# Patient Record
Sex: Female | Born: 1970 | Race: Black or African American | Hispanic: No | Marital: Single | Smoking: Former smoker
Health system: Southern US, Community
[De-identification: ages and names within clinical notes are randomized; demographics above are authoritative.]

## PROBLEM LIST (undated history)

## (undated) DIAGNOSIS — I10 Essential (primary) hypertension: Secondary | ICD-10-CM

## (undated) DIAGNOSIS — I82409 Acute embolism and thrombosis of unspecified deep veins of unspecified lower extremity: Secondary | ICD-10-CM

## (undated) DIAGNOSIS — J45909 Unspecified asthma, uncomplicated: Secondary | ICD-10-CM

## (undated) DIAGNOSIS — D849 Immunodeficiency, unspecified: Secondary | ICD-10-CM

## (undated) HISTORY — PX: ORIF FINGER FRACTURE: SHX2122

## (undated) HISTORY — PX: KNEE SURGERY: SHX244

## (undated) HISTORY — PX: ABDOMINAL HYSTERECTOMY: SHX81

## (undated) HISTORY — PX: CHOLECYSTECTOMY: SHX55

## (undated) HISTORY — PX: TUBAL LIGATION: SHX77

## (undated) HISTORY — PX: HAND SURGERY: SHX662

---

## 2000-09-30 DIAGNOSIS — B20 Human immunodeficiency virus [HIV] disease: Secondary | ICD-10-CM | POA: Insufficient documentation

## 2004-03-04 ENCOUNTER — Other Ambulatory Visit: Payer: Self-pay

## 2004-06-28 ENCOUNTER — Emergency Department: Payer: Self-pay | Admitting: Emergency Medicine

## 2004-09-30 ENCOUNTER — Emergency Department: Payer: Self-pay | Admitting: Emergency Medicine

## 2004-10-18 ENCOUNTER — Emergency Department: Payer: Self-pay | Admitting: Emergency Medicine

## 2004-11-14 ENCOUNTER — Ambulatory Visit: Payer: Self-pay

## 2005-02-09 ENCOUNTER — Emergency Department: Payer: Self-pay | Admitting: Emergency Medicine

## 2005-03-19 ENCOUNTER — Emergency Department: Payer: Self-pay | Admitting: Emergency Medicine

## 2005-05-03 ENCOUNTER — Emergency Department: Payer: Self-pay | Admitting: Emergency Medicine

## 2005-06-13 ENCOUNTER — Inpatient Hospital Stay: Payer: Self-pay | Admitting: Surgery

## 2005-07-03 ENCOUNTER — Emergency Department: Payer: Self-pay | Admitting: Emergency Medicine

## 2005-10-23 ENCOUNTER — Emergency Department: Payer: Self-pay | Admitting: Emergency Medicine

## 2005-11-05 ENCOUNTER — Emergency Department: Payer: Self-pay | Admitting: Emergency Medicine

## 2006-03-25 ENCOUNTER — Emergency Department: Payer: Self-pay | Admitting: Emergency Medicine

## 2006-04-06 ENCOUNTER — Emergency Department: Payer: Self-pay | Admitting: Emergency Medicine

## 2006-07-22 ENCOUNTER — Emergency Department: Payer: Self-pay | Admitting: Emergency Medicine

## 2006-07-23 ENCOUNTER — Ambulatory Visit: Payer: Self-pay | Admitting: Unknown Physician Specialty

## 2006-09-29 ENCOUNTER — Emergency Department: Payer: Self-pay

## 2006-12-31 ENCOUNTER — Emergency Department: Payer: Self-pay | Admitting: Emergency Medicine

## 2007-02-22 ENCOUNTER — Emergency Department: Payer: Self-pay | Admitting: Emergency Medicine

## 2007-08-31 ENCOUNTER — Emergency Department: Payer: Self-pay | Admitting: Internal Medicine

## 2007-10-09 ENCOUNTER — Emergency Department: Payer: Self-pay | Admitting: Emergency Medicine

## 2007-10-22 ENCOUNTER — Emergency Department: Payer: Self-pay | Admitting: Emergency Medicine

## 2007-11-21 ENCOUNTER — Emergency Department: Payer: Self-pay | Admitting: Emergency Medicine

## 2008-01-09 ENCOUNTER — Emergency Department: Payer: Self-pay | Admitting: Emergency Medicine

## 2008-01-12 ENCOUNTER — Emergency Department: Payer: Self-pay | Admitting: Emergency Medicine

## 2008-01-31 ENCOUNTER — Emergency Department: Payer: Self-pay | Admitting: Emergency Medicine

## 2008-02-23 ENCOUNTER — Emergency Department: Payer: Self-pay | Admitting: Internal Medicine

## 2008-03-02 ENCOUNTER — Emergency Department: Payer: Self-pay | Admitting: Emergency Medicine

## 2008-03-09 ENCOUNTER — Emergency Department (HOSPITAL_COMMUNITY): Admission: EM | Admit: 2008-03-09 | Discharge: 2008-03-09 | Payer: Self-pay | Admitting: Emergency Medicine

## 2008-09-27 ENCOUNTER — Emergency Department: Payer: Self-pay | Admitting: Emergency Medicine

## 2009-01-30 ENCOUNTER — Emergency Department: Payer: Self-pay | Admitting: Emergency Medicine

## 2009-04-14 ENCOUNTER — Emergency Department: Payer: Self-pay | Admitting: Unknown Physician Specialty

## 2009-11-30 DIAGNOSIS — F331 Major depressive disorder, recurrent, moderate: Secondary | ICD-10-CM | POA: Insufficient documentation

## 2010-01-30 ENCOUNTER — Emergency Department: Payer: Self-pay | Admitting: Emergency Medicine

## 2010-04-21 ENCOUNTER — Emergency Department: Payer: Self-pay | Admitting: Emergency Medicine

## 2010-04-23 ENCOUNTER — Emergency Department: Payer: Self-pay | Admitting: Unknown Physician Specialty

## 2010-04-24 DIAGNOSIS — D509 Iron deficiency anemia, unspecified: Secondary | ICD-10-CM | POA: Insufficient documentation

## 2010-04-24 DIAGNOSIS — J45909 Unspecified asthma, uncomplicated: Secondary | ICD-10-CM | POA: Insufficient documentation

## 2010-04-24 DIAGNOSIS — Z86718 Personal history of other venous thrombosis and embolism: Secondary | ICD-10-CM | POA: Insufficient documentation

## 2010-04-28 ENCOUNTER — Emergency Department: Payer: Self-pay | Admitting: Emergency Medicine

## 2010-05-17 ENCOUNTER — Emergency Department: Payer: Self-pay | Admitting: Emergency Medicine

## 2010-05-26 ENCOUNTER — Emergency Department: Payer: Self-pay | Admitting: Emergency Medicine

## 2010-07-27 ENCOUNTER — Emergency Department: Payer: Self-pay | Admitting: Emergency Medicine

## 2010-10-05 ENCOUNTER — Ambulatory Visit: Payer: Self-pay | Admitting: Emergency Medicine

## 2010-10-09 ENCOUNTER — Emergency Department: Payer: Self-pay | Admitting: Emergency Medicine

## 2010-10-09 LAB — PATHOLOGY REPORT

## 2010-11-28 ENCOUNTER — Emergency Department: Payer: Self-pay | Admitting: Emergency Medicine

## 2011-01-13 ENCOUNTER — Emergency Department: Payer: Self-pay | Admitting: Unknown Physician Specialty

## 2011-01-15 ENCOUNTER — Emergency Department: Payer: Self-pay | Admitting: *Deleted

## 2011-04-29 ENCOUNTER — Emergency Department: Payer: Self-pay | Admitting: Internal Medicine

## 2011-08-29 ENCOUNTER — Emergency Department: Payer: Self-pay

## 2011-08-29 LAB — URINALYSIS, COMPLETE
Bilirubin,UR: NEGATIVE
Glucose,UR: NEGATIVE mg/dL (ref 0–75)
Leukocyte Esterase: NEGATIVE
Specific Gravity: 1.021 (ref 1.003–1.030)
Squamous Epithelial: 26

## 2011-12-02 ENCOUNTER — Emergency Department: Payer: Self-pay | Admitting: Emergency Medicine

## 2012-01-12 ENCOUNTER — Inpatient Hospital Stay: Payer: Self-pay | Admitting: Surgery

## 2012-01-12 LAB — URINALYSIS, COMPLETE
Bilirubin,UR: NEGATIVE
Blood: NEGATIVE
Glucose,UR: NEGATIVE mg/dL (ref 0–75)
Ketone: NEGATIVE
Leukocyte Esterase: NEGATIVE
Nitrite: NEGATIVE
Ph: 5 (ref 4.5–8.0)
Protein: 30
RBC,UR: 2 /HPF (ref 0–5)
Squamous Epithelial: 8
WBC UR: 2 /HPF (ref 0–5)

## 2012-01-12 LAB — COMPREHENSIVE METABOLIC PANEL
BUN: 12 mg/dL (ref 7–18)
Bilirubin,Total: 2.7 mg/dL — ABNORMAL HIGH (ref 0.2–1.0)
Calcium, Total: 9 mg/dL (ref 8.5–10.1)
Chloride: 106 mmol/L (ref 98–107)
Co2: 20 mmol/L — ABNORMAL LOW (ref 21–32)
Creatinine: 0.78 mg/dL (ref 0.60–1.30)
EGFR (African American): >60
EGFR (Non-African Amer.): >60
Potassium: 3.4 mmol/L — ABNORMAL LOW (ref 3.5–5.1)
SGPT (ALT): 37 U/L
Total Protein: 8.3 g/dL — ABNORMAL HIGH (ref 6.4–8.2)

## 2012-01-12 LAB — CBC
HCT: 46.2 % (ref 35.0–47.0)
HGB: 15.7 g/dL (ref 12.0–16.0)
MCHC: 33.9 g/dL (ref 32.0–36.0)
Platelet: 246 10*3/uL (ref 150–440)
WBC: 7.2 10*3/uL (ref 3.6–11.0)

## 2012-03-16 ENCOUNTER — Emergency Department: Payer: Self-pay | Admitting: Emergency Medicine

## 2012-03-16 LAB — CBC WITH DIFFERENTIAL/PLATELET
Basophil #: 0 10*3/uL (ref 0.0–0.1)
Eosinophil #: 0.2 10*3/uL (ref 0.0–0.7)
HGB: 14.9 g/dL (ref 12.0–16.0)
Lymphocyte #: 1.5 10*3/uL (ref 1.0–3.6)
Lymphocyte %: 38.1 %
MCV: 96 fL (ref 80–100)
Monocyte #: 0.5 x10 3/mm (ref 0.2–0.9)
Monocyte %: 13.3 %
Platelet: 211 10*3/uL (ref 150–440)
RBC: 4.65 10*6/uL (ref 3.80–5.20)
RDW: 13.3 % (ref 11.5–14.5)
WBC: 4 10*3/uL (ref 3.6–11.0)

## 2012-03-16 LAB — BASIC METABOLIC PANEL
Anion Gap: 9 (ref 7–16)
BUN: 13 mg/dL (ref 7–18)
Calcium, Total: 9 mg/dL (ref 8.5–10.1)
Co2: 26 mmol/L (ref 21–32)
EGFR (African American): >60
EGFR (Non-African Amer.): >60
Glucose: 99 mg/dL (ref 65–99)
Osmolality: 281 (ref 275–301)

## 2012-04-05 ENCOUNTER — Emergency Department: Payer: Self-pay | Admitting: Emergency Medicine

## 2012-06-03 ENCOUNTER — Emergency Department: Payer: Self-pay | Admitting: Emergency Medicine

## 2012-06-18 HISTORY — PX: HERNIA REPAIR: SHX51

## 2012-06-26 ENCOUNTER — Inpatient Hospital Stay: Payer: Self-pay | Admitting: Psychiatry

## 2012-06-26 LAB — COMPREHENSIVE METABOLIC PANEL
Albumin: 4.1 g/dL (ref 3.4–5.0)
Alkaline Phosphatase: 113 U/L (ref 50–136)
BUN: 10 mg/dL (ref 7–18)
Bilirubin,Total: 2.1 mg/dL — ABNORMAL HIGH (ref 0.2–1.0)
Calcium, Total: 8.4 mg/dL — ABNORMAL LOW (ref 8.5–10.1)
Creatinine: 0.88 mg/dL (ref 0.60–1.30)
EGFR (African American): >60
EGFR (Non-African Amer.): >60
Osmolality: 282 (ref 275–301)
SGPT (ALT): 243 U/L — ABNORMAL HIGH (ref 12–78)
Total Protein: 7.9 g/dL (ref 6.4–8.2)

## 2012-06-26 LAB — URINALYSIS, COMPLETE
Bilirubin,UR: NEGATIVE
Glucose,UR: NEGATIVE mg/dL (ref 0–75)
Ketone: NEGATIVE
Leukocyte Esterase: NEGATIVE
Nitrite: NEGATIVE
Protein: 100
Squamous Epithelial: 4
WBC UR: <1 /HPF (ref 0–5)

## 2012-06-26 LAB — DRUG SCREEN, URINE
Barbiturates, Ur Screen: NEGATIVE (ref ?–200)
Benzodiazepine, Ur Scrn: NEGATIVE (ref ?–200)
Cannabinoid 50 Ng, Ur ~~LOC~~: POSITIVE (ref ?–50)
Cocaine Metabolite,Ur ~~LOC~~: POSITIVE (ref ?–300)
Methadone, Ur Screen: NEGATIVE (ref ?–300)
Opiate, Ur Screen: NEGATIVE (ref ?–300)
Phencyclidine (PCP) Ur S: NEGATIVE (ref ?–25)
Tricyclic, Ur Screen: NEGATIVE (ref ?–1000)

## 2012-06-26 LAB — CBC
HCT: 46.8 % (ref 35.0–47.0)
MCH: 32.2 pg (ref 26.0–34.0)
MCHC: 33.6 g/dL (ref 32.0–36.0)
MCV: 96 fL (ref 80–100)
Platelet: 254 10*3/uL (ref 150–440)
RBC: 4.9 10*6/uL (ref 3.80–5.20)
WBC: 5.8 10*3/uL (ref 3.6–11.0)

## 2012-06-27 LAB — BEHAVIORAL MEDICINE 1 PANEL
Albumin: 3.3 g/dL — ABNORMAL LOW (ref 3.4–5.0)
Alkaline Phosphatase: 106 U/L (ref 50–136)
Anion Gap: 7 (ref 7–16)
BUN: 15 mg/dL (ref 7–18)
Basophil #: 0 10*3/uL (ref 0.0–0.1)
Basophil %: 0.8 %
Bilirubin,Total: 1.3 mg/dL — ABNORMAL HIGH (ref 0.2–1.0)
Calcium, Total: 8.6 mg/dL (ref 8.5–10.1)
Chloride: 110 mmol/L — ABNORMAL HIGH (ref 98–107)
Co2: 26 mmol/L (ref 21–32)
Creatinine: 0.94 mg/dL (ref 0.60–1.30)
EGFR (African American): >60
EGFR (Non-African Amer.): >60
Eosinophil #: 0.1 10*3/uL (ref 0.0–0.7)
Eosinophil %: 3.2 %
Glucose: 88 mg/dL (ref 65–99)
HCT: 45.9 % (ref 35.0–47.0)
HGB: 15.2 g/dL (ref 12.0–16.0)
Lymphocyte #: 1.9 10*3/uL (ref 1.0–3.6)
Lymphocyte %: 46.1 %
MCH: 31.7 pg (ref 26.0–34.0)
MCHC: 33.1 g/dL (ref 32.0–36.0)
MCV: 96 fL (ref 80–100)
Monocyte #: 0.4 x10 3/mm (ref 0.2–0.9)
Monocyte %: 10.9 %
Neutrophil #: 1.6 10*3/uL (ref 1.4–6.5)
Neutrophil %: 39 %
Osmolality: 285 (ref 275–301)
Platelet: 203 10*3/uL (ref 150–440)
Potassium: 3.9 mmol/L (ref 3.5–5.1)
RBC: 4.8 10*6/uL (ref 3.80–5.20)
RDW: 14.7 % — ABNORMAL HIGH (ref 11.5–14.5)
SGOT(AST): 107 U/L — ABNORMAL HIGH (ref 15–37)
SGPT (ALT): 172 U/L — ABNORMAL HIGH (ref 12–78)
Sodium: 143 mmol/L (ref 136–145)
Thyroid Stimulating Horm: 0.913 u[IU]/mL
Total Protein: 6.6 g/dL (ref 6.4–8.2)
WBC: 4 10*3/uL (ref 3.6–11.0)

## 2012-08-08 ENCOUNTER — Emergency Department: Payer: Self-pay | Admitting: Emergency Medicine

## 2014-01-11 ENCOUNTER — Emergency Department: Payer: Self-pay | Admitting: Emergency Medicine

## 2014-01-11 LAB — URINALYSIS, COMPLETE
Bilirubin,UR: NEGATIVE
Glucose,UR: NEGATIVE mg/dL (ref 0–75)
Leukocyte Esterase: NEGATIVE
Nitrite: NEGATIVE
Ph: 5 (ref 4.5–8.0)
Protein: >=500
RBC,UR: 4 /HPF (ref 0–5)
Specific Gravity: 1.022 (ref 1.003–1.030)

## 2014-01-11 LAB — COMPREHENSIVE METABOLIC PANEL
ALT: 94 U/L — AB
Albumin: 4 g/dL (ref 3.4–5.0)
Alkaline Phosphatase: 106 U/L
Anion Gap: 11 (ref 7–16)
BILIRUBIN TOTAL: 0.6 mg/dL (ref 0.2–1.0)
BUN: 10 mg/dL (ref 7–18)
CREATININE: 0.78 mg/dL (ref 0.60–1.30)
Calcium, Total: 8.7 mg/dL (ref 8.5–10.1)
Chloride: 108 mmol/L — ABNORMAL HIGH (ref 98–107)
Co2: 18 mmol/L — ABNORMAL LOW (ref 21–32)
EGFR (Non-African Amer.): >60
Glucose: 81 mg/dL (ref 65–99)
Osmolality: 272 (ref 275–301)
Potassium: 3.7 mmol/L (ref 3.5–5.1)
SGOT(AST): 78 U/L — ABNORMAL HIGH (ref 15–37)
Sodium: 137 mmol/L (ref 136–145)
Total Protein: 8.4 g/dL — ABNORMAL HIGH (ref 6.4–8.2)

## 2014-01-11 LAB — DRUG SCREEN, URINE
AMPHETAMINES, UR SCREEN: NEGATIVE (ref ?–1000)
Barbiturates, Ur Screen: NEGATIVE (ref ?–200)
Benzodiazepine, Ur Scrn: NEGATIVE (ref ?–200)
CANNABINOID 50 NG, UR ~~LOC~~: POSITIVE (ref ?–50)
COCAINE METABOLITE, UR ~~LOC~~: POSITIVE (ref ?–300)
MDMA (ECSTASY) UR SCREEN: NEGATIVE (ref ?–500)
METHADONE, UR SCREEN: NEGATIVE (ref ?–300)
Opiate, Ur Screen: NEGATIVE (ref ?–300)
PHENCYCLIDINE (PCP) UR S: NEGATIVE (ref ?–25)
Tricyclic, Ur Screen: NEGATIVE (ref ?–1000)

## 2014-01-11 LAB — CBC
HCT: 46.9 % (ref 35.0–47.0)
HGB: 15.1 g/dL (ref 12.0–16.0)
MCH: 31.9 pg (ref 26.0–34.0)
MCHC: 32.2 g/dL (ref 32.0–36.0)
MCV: 99 fL (ref 80–100)
PLATELETS: 233 10*3/uL (ref 150–440)
RBC: 4.74 10*6/uL (ref 3.80–5.20)
RDW: 13.5 % (ref 11.5–14.5)
WBC: 8.3 10*3/uL (ref 3.6–11.0)

## 2014-01-11 LAB — ACETAMINOPHEN LEVEL

## 2014-01-11 LAB — ETHANOL
Ethanol %: 0.184 % — ABNORMAL HIGH (ref 0.000–0.080)
Ethanol: 184 mg/dL

## 2014-01-11 LAB — SALICYLATE LEVEL: Salicylates, Serum: 6.2 mg/dL — ABNORMAL HIGH

## 2014-06-15 ENCOUNTER — Emergency Department: Payer: Self-pay | Admitting: Student

## 2014-07-12 ENCOUNTER — Emergency Department: Payer: Self-pay | Admitting: Emergency Medicine

## 2014-08-25 ENCOUNTER — Emergency Department: Payer: Self-pay | Admitting: Emergency Medicine

## 2014-09-06 DIAGNOSIS — I10 Essential (primary) hypertension: Secondary | ICD-10-CM | POA: Insufficient documentation

## 2014-09-29 ENCOUNTER — Emergency Department
Admit: 2014-09-29 | Disposition: A | Discharge status: Home / Self Care | Payer: Self-pay | Admitting: Emergency Medicine

## 2014-10-05 NOTE — H&P (Signed)
PATIENT NAME:  Diane Castillo, Diane Castillo MR#:  [id number] DATE OF BIRTH:  1971-02-16  DATE OF ADMISSION:  01/12/2012  HISTORY OF PRESENT ILLNESS: This 44 year old female came into the Emergency Room with a chief complaint of severe pain in the central upper aspect of her abdomen. She reports this has been going on for four days. She says it has increased in severity. She reports that two days ago she did vomit and has had decreased oral intake. She has had some persistent nausea even today. She reports she has also had some diarrhea and saw a small amount of bright red blood when she wiped with tissue paper. She reports she has felt cold some of the time, hot some of the time, but no documented fever. She reports the pain is aggravated by movement. She has received some morphine while in the Emergency Room but now having persistent pain. She was initially evaluated by the Emergency Room staff. She has been here in the Emergency Room since sometime during the night. Has had CT scan which demonstrated a hernia which contained fat.   PAST MEDICAL HISTORY:  1. History of HIV infection which dates back to 1996. She has been cared for in [facility] Has been taking medicines for HIV and reports she has been doing well.  2. She does have some history of asthma and occasionally takes albuterol inhaler if she needs it. 3. Did have some recent sinus congestion just about five days ago and saw a doctor in [place] who prescribed Levaquin and reports improvement.  4. She reports no history of heart disease and no history of hepatitis.   PAST SURGICAL HISTORY:  1. Umbilical hernia repair as an infant.  2. Hysterectomy about a year ago that was done in [facility] done for fibroids. Does not know if her ovaries were removed or not. 3. Laparoscopic cholecystectomy done by Dr. Cecelia Byars. 4. Anterior cruciate ligament repair on the right and reports doing well with that.   MEDICATIONS:  1. Truvada 200/300 daily. 2. Reyataz  orally.  3. Norvir 100 mg 2 times per day.  4. Levaquin taken daily. 5. Aspirin 81 mg.   ALLERGIES: Penicillin causes hives. Also, allergy to tramadol.   SOCIAL HISTORY: Does not do much heavy lifting or straining. Does smoke and drinks a moderate amount of alcohol.   FAMILY HISTORY: Family history was questioned and she was not aware of any significant familial illness.   REVIEW OF SYSTEMS: She reports no recent visual or auditory changes. Has had some recent congestion in the nasal area and has been treated with Levaquin over the last five days and improved. She reports occasional wheezing for which she takes the albuterol and usually does well with the albuterol. She's had no chest pains. She reports she does not have menstrual periods since her hysterectomy. Has not had any hot flashes after her hysterectomy. She reports no ankle edema. No recent sores or boils. No recent psychiatric symptoms. Review of systems otherwise negative.   PHYSICAL EXAMINATION:   GENERAL: She is awake, alert, oriented in some mild degree of distress.   VITAL SIGNS: Temperature 97.8, pulse 72, respirations 18, blood pressure 102/60, oxygen saturation 99%.   HEENT: Sclerae clear. Pupils equal, reactive to light. Extraocular movements are intact. Pharynx is clear.   NECK: No palpable mass.   LUNGS: Lung sounds are clear.   HEART: Regular rhythm, S1 and S2 without murmur.   ABDOMEN: Obese. She has a tender 4 cm palpable  mass which is just about 1 to 2 inches above her navel which is tender and could not press on this due to the magnitude of tenderness. Remainder of abdomen is soft and nontender, quite obese.   EXTREMITIES: No dependent edema.   NEUROLOGIC: Awake, alert, oriented, moving all extremities.   CLINICAL DATA: On metabolic panel potassium was slightly low at 3.4. Total bilirubin 2.7. Troponin normal. CBC normal.   I reviewed her CT images and report. This does demonstrate hernia which appears to  be just above the navel which contains fat. It also appears there may be a cystic structure in the right lower quadrant which may be some 5 cm in dimension. Radiologist suggested it could be a cyst or could be diverticulum. There was no tumor seen. There were numerous small abnormalities in the liver consistent with hemangioma.   Urinalysis with no bacteria seen.   IMPRESSION: Incarcerated ventral hernia with pain.   The patient is needing narcotic analgesics and not getting significant relief. I recommended ventral hernia repair. I discussed the operation, care, risks, and benefits with her in detail and will get that scheduled to do today.   ____________________________ J. Renda RollsWilton Smith, MD jws:drc D: 01/12/2012 16:10:22 ET T: 01/12/2012 16:23:57 ET JOB#: [id number]  cc: Adella HareJ. Wilton Smith, MD, <Dictator> Adella HareWILTON J SMITH MD ELECTRONICALLY SIGNED 01/12/2012 16:51

## 2014-10-05 NOTE — Op Note (Signed)
PATIENT NAME:  Diane Castillo, Diane Castillo MR#:  [id number] DATE OF BIRTH:  1970-09-27  DATE OF PROCEDURE:  01/12/2012  PREOPERATIVE DIAGNOSIS: Incarcerated ventral hernia.   POSTOPERATIVE DIAGNOSIS: Incarcerated ventral hernia.   PROCEDURE: Ventral hernia repair.   SURGEON: Adella HareJ. Wilton Smith, MD   ANESTHESIA: General.   INDICATIONS: This 44 year old female reports four day history of pain in the epigastric area. She had findings of Castillo tender mass which is about 4 cm in dimension, CT findings of ventral hernia. Surgery was recommended for definitive treatment.   DESCRIPTION OF PROCEDURE: The patient was placed on the operating table in the supine position under general endotracheal anesthesia. The abdomen was prepared with ChloraPrep and draped in Castillo sterile manner.  I noted the site of an old scar in the epigastric area just about an inch above the umbilicus. Castillo longitudinally oriented 5 cm incision was made in the epigastrium just above the umbilicus and carried down through subcutaneous tissues to encounter an edematous mass of tissue. There was Castillo hernia sac which was dissected free from surrounding structures and dissected down to Castillo fascial ring defect. Next, the sac was opened. There was omentum within the sac which appeared to be viable. The fascial ring defect was enlarged on the patient's left side. It was enlarged by about 7 mm. This allowed reduction of the incarcerated omentum. Next, the sac was excised with electrocautery. Several small bleeding points were cauterized. Hemostasis was subsequently intact. Next, Castillo portion of the properitoneal fat was dissected away from the fascia extending back some 7 mm circumferentially. The fascia appeared to have satisfactory thickness and integrity. I did identify one old small bit of suture material which was removed. Next, repair was carried out with Castillo transversely oriented suture line of interrupted 0 Surgilon figure-of-eight sutures. The repair looked good. It is  noted that during the course of the procedure Castillo number of small bleeding points were cauterized. Hemostasis was subsequently intact. Next, the subcutaneous tissues which had been surrounding the sac were approximated with Castillo 5-0 Monocryl purse-string suture. Next, the skin was closed with Castillo running 5-0 Monocryl subcuticular suture and Dermabond. The patient tolerated surgery satisfactorily and was then prepared for transfer to the recovery room.   ____________________________ Shela CommonsJ. Renda RollsWilton Smith, MD jws:drc D: 01/12/2012 18:00:00 ET T: 01/13/2012 11:10:28 ET JOB#: [id number]  cc: Adella HareJ. Wilton Smith, MD, <Dictator> Adella HareWILTON J SMITH MD ELECTRONICALLY SIGNED 01/19/2012 10:43

## 2014-10-08 NOTE — H&P (Signed)
PATIENT NAME:  Diane AcostaGRAVES, Diane A MR#:  [id number] DATE OF BIRTH:  [birth year]  DATE OF ADMISSION:  06/26/2012  REFERRING PHYSICIAN: Cecille AmsterdamJonathan E. Mayford KnifeWilliams, MD  ATTENDING PHYSICIAN: Meribeth Vitug B. Shiane Wenberg, MD   IDENTIFYING DATA: The patient is a 44 year old female with history of depression and substance abuse.   CHIEF COMPLAINT: "I feel unsafe."   HISTORY OF PRESENT ILLNESS: The patient became increasingly depressed, anxious and suicidal after she has learned that her son is sentenced to a 3-year prison term. She reports poor sleep, decreased appetite, anhedonia, feeling of guilt, hopelessness, worthlessness, crying spells, poor energy and concentration, heightened anxiety and suicidal ideation with a plan to overdose on pills. She also started drinking more and has been drinking up to a 12-pack a day. She also used cocaine and cannabis. The patient came to the hospital feeling that she needed to get away from her troubles. She denies psychotic symptoms or symptoms suggestive of bipolar mania.   PAST PSYCHIATRIC HISTORY: There is a long history of drinking and some crack use that  escalated lately. She [facility]. She does not disclose any details about that admission. She denies any prior suicide attempts.   FAMILY PSYCHIATRIC HISTORY: None reported.   PAST MEDICAL HISTORY: She is HIV positive, [facility] care [facility]. She has been compliant with her medications.   ALLERGIES: PENICILLIN AND TRAMADOL.   MEDICATIONS ON ADMISSION: Reyataz 300 mg daily, Truvada 1 tablet daily, ritonavir 100 mg daily, trazodone 100 mg at night.   SOCIAL HISTORY: She works at [employer]. She lives with her 42 year old daughter. Her mother was just diagnosed with lung cancer and will have surgery as next week. The patient would like to be discharged in time to participate in her mother's surgery.   REVIEW OF SYSTEMS:   CONSTITUTIONAL: No fevers or chills. No weight changes.   EYES: No double or blurred vision.  ENT: No hearing loss.  RESPIRATORY: No shortness of breath or cough.  CARDIOVASCULAR: No chest pain or orthopnea.  GASTROINTESTINAL: No abdominal pain, diarrhea or constipation.  GENITOURINARY: No incontinence or frequency.  ENDOCRINE: No heat or cold intolerance.  LYMPHATIC: No anemia or easy bruising.  INTEGUMENTARY: No acne or rash.  MUSCULOSKELETAL: No muscle or joint pain.  NEUROLOGIC: No tingling or weakness.  PSYCHIATRIC: See history of present illness for details.   PHYSICAL EXAMINATION:  VITAL SIGNS: Blood pressure 127/81, pulse 62, respirations 18, temperature 97.7.  GENERAL: This is a slightly obese female in no acute distress.  HEENT: Pupils are equal, round and reactive to light. Sclerae anicteric.  NECK: Supple. No thyromegaly.  LUNGS: Clear to auscultation. No dullness to percussion.  HEART: Regular rhythm and rate. No murmurs, rubs or gallops.  ABDOMEN: Soft, nontender, nondistended. Positive bowel sounds.  MUSCULOSKELETAL: Normal muscle strength in all extremities.  SKIN: No rashes or bruises.  LYMPHATIC: No cervical adenopathy.  NEUROLOGIC: Cranial nerves II through XII are intact.   LABORATORY DATA: Chemistries are within normal limits. Blood alcohol level on admission 0.208. LFTs: Total protein 6.6, albumin 3.3, bilirubin 1.3, alkaline phosphatase 106, AST 107, ALT 172. TSH 0.91. Urine tox screen positive for cocaine and cannabinoids. CBC within normal limits. Urinalysis is not suggestive of urinary tract infection.   MENTAL STATUS EXAMINATION ON ADMISSION: The patient is alert and oriented to person, place, time and situation. She is pleasant, polite and cooperative. She is in bed. There is hardly any eye contact. There is  psychomotor retardation. Her speech is soft. Mood is depressed with tearful affect. Thought processing is logical and goal oriented. Thought content: She denies suicidal or homicidal ideation but was admitted  after an overdose on Klonopin. There are no delusions or paranoia. There are no auditory or visual hallucinations. Her cognition is grossly intact. Her insight and judgment are questionable.   SUICIDE RISK ASSESSMENT ON ADMISSION: This is a patient with history of depression, mood instability, substance abuse and physical illness, who overdosed on Klonopin in the context of treatment noncompliance and serious family problems.   DIAGNOSES:  AXIS I: Major depressive disorder, recurrent, severe. Alcohol dependence. Cocaine abuse. Cannabis abuse.  AXIS II: Deferred.  AXIS III: HIV-positive. Status post Klonopin overdose.  AXIS IV: Mental and physical illness, substance abuse, family legal, access to care.  AXIS V: Global assessment of functioning score on admission 25.   PLAN: The patient was admitted to [facility] for safety, stabilization and medication management. She was initially placed on suicide precautions and was closely monitored for any unsafe behaviors. She underwent full psychiatric and risk assessment. She received pharmacotherapy, individual and group psychotherapy, substance abuse counseling and support from therapeutic milieu.  1.  Suicidal ideation: The patient is able to contract for safety.  2.  Mood: She was started on Prozac by Dr. Garnetta [name] in the Emergency Room. We will continue that. 3.  HIV: We will continue all her antiretrovirals as prescribed by [facility].  4.  Alcohol dependence: She was placed on the CIWA protocol. Will be monitored for any symptoms of alcohol withdrawal.  5.  Substance abuse treatment: The patient minimizes her problems and frankly lies about it. Is not interested in substance abuse rehab.  6.  Disposition: Most likely to home with family.   ____________________________ Ellin Goodie Desiderio Dolata, MD jbp:jm D: 06/27/2012 16:18:46 ET T: 06/27/2012 16:46:41 ET JOB#: [id number]  cc: Taysean Wager B. Jennet Maduro, MD,  <Dictator> Shari Prows MD ELECTRONICALLY SIGNED 06/30/2012 0:07

## 2014-10-08 NOTE — Consult Note (Signed)
Chief Complaint:   Chief Complaint Depression and SI.   Presenting Symptoms:   Presenting Symptoms Anxiety/Panic  Sleep Disturbance  Mood Disturbance  Substance Use   History of Present Illness:   History of Present Illness Pt is a 44 years old  AAF who presnted to the ED after she took 5 klonopin pills, 3 advil pm, snorted crack and drank a 12 pack beer. She stated that she was feeling suicidal since she found out that her mother has been Dx with lung cancer yesterday and she cannot handle this stress. She was tearful and sad during the interview. She stated that she has h/o AIDS and has been compliant  with her meds which she gets [facility]. However, she has been drinking consistently and consumed more than 12 pack yesterday. Someone brought her crack as well as 2 joints. She was lethargic during my interview. She reported that she want to be present during her mother's surgery next week. She stated that her mood, is very depressed, hopeless, helpless, with anhedonia and crying spells. She was unable to contract for safety.   Target Symptoms:   Depressive Interest Loss  Appetite Change  Sleep Change  Anhedonia  Suicidality  Depressed Mood    Psychosis Hallucinations  Negative Symptoms    Arousal/Cognitive Inattention  Decreased Concentration  Psychosis    Capacity Communicates clear choices  Can care for self independently   Past Psychiatric Treatment: First Treatment: long h/o mental illness.   Previous Hospitalizations: H/o suicide attempts in the past.   Current Psychotropic Medications: Does not remember the names.  Substance Abuse- Alcohol: The patient has a long history of alcohol dependence and despite multiple rehab efforts has not been able to remain sober for a sustained period of time.. Drinks 12 packs per day. has elevated liver enzymes. H/o w/d seizures and black outs.  Substance Abuse- Cocaine: The patient currently uses cocaine on a regular  basis..  Substance Abuse- Opiates: The patient denies any current abuse of opiates or history of opiate abuse..  Substance Abuse- Cannabis: The patient currently uses cannabis on a regular basis..  Substance Abuse- Tobacco Use: Tobacco Use: Yes.  PAST MEDICAL & SURGICAL HX:  Significant Events:   Peripheral Neuropathy:    Pulmonary Embolus:    Asthma:    HIV:    hysterectomy:    Gall Bladder:    acl replacement 2008:    Hernia Repair:   CURRENT OUTPATIENT MEDICATIONS:  Home Medications: Medication Instructions Status  Truvada 200 mg-300 mg oral tablet 1 tab(s) orally once a day Active  Reyataz 300 milligram(s) orally once a day Active  Norvir 100 mg oral capsule 1 cap(s) orally 2 times a day Active   Social History: Lives by herself close to her mother who is recently DX with Lung cancer..  Mental Status Exam:   Mental Status Exam Moderately built AAF who was lying in bed. Depressed, sad and tearful.    Speech Soft    Mood Depressed  Agitated    Affect Depressed  Tearful    Thought Processes Linear/logical/goal directed    Thought Content Suicidal ideation    Orientation Self  Place  Time  Person    Attention Alert    Concentration Fair    Memory Intact    Fund of Knowledge Fair    Language Fair    Judgement Fair    Insight Fair    Reliabiity Fair   Suicide Risk Assessment: Suicide Risk Level Some  current risk.  Review of Systems:  Review of Systems:   Medications/Allergies Reviewed Medications/Allergies reviewed   NURSING FLOWSHEETS:  Vital Signs/Nurse Notes-CM: ED Vital Sign Flow Sheet:   09-Jan-14 08:14   Temp Temperature 98.7   Temp Source oral   Pulse Pulse 82   Respirations Respirations 18   SBP SBP 119   DBP DBP 61   Pulse Ox % Pulse Ox % 98   Pulse Ox Source Source Room Air   Pain Scale (0-10) Pain Scale (0-10) Scale:0  ED Behavioral Health Assessment:   09-Jan-14 10:00   Cognitive Assessment Alert & Oriented    Behavior Appropriate to stimulations; Cooperative   Nutrition and fluids offered? Yes   Toileting and hygiene offered? Yes   Sitter Present? Sitter Present? No   Engineer, structural Present? Yes   Scientist, clinical (histocompatibility and immunogenetics)? Forenisc equipment is not being utilized.   Have you checked for scheduled medications? Yes   LAB:  Laboratory Results: Thyroid:    09-Jan-14 05:46, Thyroid Stimulating Hormone   Thyroid Stimulating Hormone 1.45   0.45-4.50  (International Unit)   -----------------------  Pregnant patients have   different reference   ranges for TSH:   - - - - - - - - - -   Pregnant, first trimetser:   0.36 - 2.50 uIU/mL  Hepatic:    09-Jan-14 05:46, Comprehensive Metabolic Panel   Bilirubin, Total 2.1   Alkaline Phosphatase 113   SGPT (ALT) 243   SGOT (AST) 213   Total Protein, Serum 7.9   Albumin, Serum 4.1  Routine Chem:   Glucose, Serum 95   BUN 10   Creatinine (comp) 0.88   Sodium, Serum 142   Potassium, Serum 3.6   Chloride, Serum 110   CO2, Serum 22   Calcium (Total), Serum 8.4   Osmolality (calc) 282   eGFR (African American) >60   eGFR (Non-African American) >60   eGFR values <5m/min/1.73 m2 may be an indication of chronic  kidney disease (CKD).  Calculated eGFR is useful in patients with stable renal function.  The eGFR calculation will not be reliable in acutely ill patients  when serum creatinine is changing rapidly. It is not useful in   patients on dialysis. The eGFR calculation may not be applicable  to patients at the low and high extremes of body sizes, pregnant  women, and vegetarians.   Anion Gap 10    09-Jan-14 05:46, Ethanol, Serum   Ethanol, S. 208   Ethanol % (comp) 0.208   Result(s) reported on 26 Jun 2012 at 06:35AM.  Urine Drugs:    09-Jan-14 05:46, Urine Drug Screen, Qual   Tricyclic Antidepressant, Ur Qual (comp) NEGATIVE   Result(s) reported on 26 Jun 2012 at 06:49AM.   Amphetamines, Urine  Qual. NEGATIVE   MDMA, Urine Qual. NEGATIVE   Cocaine Metabolite, Urine Qual. POSITIVE   Opiate, Urine qual NEGATIVE   Phencyclidine, Urine Qual. NEGATIVE   Cannabinoid, Urine Qual. POSITIVE   Barbiturates, Urine Qual. NEGATIVE   Benzodiazepine, Urine Qual. NEGATIVE   -----------------  The URINE DRUG SCREEN provides only a preliminary, unconfirmed  analytical test result and should not be used for non-medical   purposes.  Clinical consideration and professional judgment should be   applied to any positive drug screen result due to possible  interfering substances.  A more specific alternate chemical method  must be used in order to obtain a confirmed analytical result.  Gas  chromatography/mass spectrometry (  GC/MS) is the preferred  confirmatory method.   Methadone, Urine Qual. NEGATIVE  Routine UA:    09-Jan-14 05:46, Urinalysis   Color (UA) Yellow   Clarity (UA) Hazy   Glucose (UA) Negative   Bilirubin (UA) Negative   Ketones (UA) Negative   Specific Gravity (UA) 1.012   Blood (UA) 1+   pH (UA) 5.0   Protein (UA)    100 mg/dL   Nitrite (UA) Negative   Leukocyte Esterase (UA) Negative   Result(s) reported on 26 Jun 2012 at 06:43AM.   RBC (UA) <1 /HPF   WBC (UA) <1 /HPF   Bacteria (UA) TRACE   Epithelial Cells (UA) 4 /HPF   Result(s) reported on 26 Jun 2012 at 06:43AM.  Routine Hem:    09-Jan-14 05:46, Hemogram, Platelet Count   WBC (CBC) 5.8   RBC (CBC) 4.90   Hemoglobin (CBC) 15.7   Hematocrit (CBC) 46.8   Platelet Count (CBC) 254   Result(s) reported on 26 Jun 2012 at 06:30AM.   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   Axis III: See PMH.   Axis IV: Problems with primary support group .   Axis V: 25.  Treatment Plan: Patient understands the risks and benefits of the alternative treatments..   Pt admitted to [facility] Unit on IVC.  Suicide  precautions.  She will be started on CIWA protocol. She will be started on Prozac 3m po qam.  She will be given Trazodone 1066mpo qhs for insomnia.   Thank you for allowing me to participate in the care of this pt.  Continue HIV meds.  Discharge planning once stable..  Electronic Signatures: FaJeronimo NormaMD)  (Signed 09-Jan-14 14:31)  Authored: Chief Complaint, Presenting Symptoms, History of Present Illness, Target Symptoms, Past Psychiatric Treatment, Substance Abuse History, PAST MEDICAL & SURGICAL HX, CURRENT OUTPATIENT MEDICATIONS, Social History, Mental Status Exam, Suicide Risk Assessment, Review of Systems, NURSING FLOWSHEETS, LAB, Assessment & Diagnosis, Treatment Plan   Last Updated: 09-Jan-14 14:31 by FaJeronimo NormaMD)

## 2014-11-23 ENCOUNTER — Emergency Department
Admission: EM | Admit: 2014-11-23 | Discharge: 2014-11-23 | Disposition: A | Discharge status: Home / Self Care | Payer: Medicaid Other | Attending: Emergency Medicine | Admitting: Emergency Medicine

## 2014-11-23 ENCOUNTER — Encounter: Payer: Self-pay | Admitting: *Deleted

## 2014-11-23 DIAGNOSIS — I10 Essential (primary) hypertension: Secondary | ICD-10-CM | POA: Diagnosis not present

## 2014-11-23 DIAGNOSIS — J45909 Unspecified asthma, uncomplicated: Secondary | ICD-10-CM | POA: Insufficient documentation

## 2014-11-23 DIAGNOSIS — Z720 Tobacco use: Secondary | ICD-10-CM | POA: Diagnosis not present

## 2014-11-23 DIAGNOSIS — J020 Streptococcal pharyngitis: Secondary | ICD-10-CM | POA: Insufficient documentation

## 2014-11-23 DIAGNOSIS — Z880 Allergy status to penicillin: Secondary | ICD-10-CM | POA: Diagnosis not present

## 2014-11-23 DIAGNOSIS — J029 Acute pharyngitis, unspecified: Secondary | ICD-10-CM | POA: Diagnosis present

## 2014-11-23 HISTORY — DX: Essential (primary) hypertension: I10

## 2014-11-23 HISTORY — DX: Unspecified asthma, uncomplicated: J45.909

## 2014-11-23 MED ORDER — IBUPROFEN 800 MG PO TABS: 800.0000 mg | ORAL_TABLET | Freq: Three times a day (TID) | ORAL | Status: DC | PRN

## 2014-11-23 MED ORDER — AZITHROMYCIN 250 MG PO TABS: ORAL_TABLET | ORAL | Status: DC

## 2014-11-23 NOTE — ED Notes (Signed)
Strep sent to lab

## 2014-11-23 NOTE — Discharge Instructions (Signed)
Take medication as prescribed. Drink plenty of fluids. Rest.   Follow up with your primary care physician this week or the above as needed.   Return to ER for new or worsening concerns.   Strep Throat Strep throat is an infection of the throat. It is caused by a germ. Strep throat spreads from person to person by coughing, sneezing, or close contact. HOME CARE  Rinse your mouth (gargle) with warm salt water (1 teaspoon salt in 1 cup of water). Do this 3 to 4 times per day or as needed for comfort.  Family members with a sore throat or fever should see a doctor.  Make sure everyone in your house washes their hands well.  Do not share food, drinking cups, or personal items.  Eat soft foods until your sore throat gets better.  Drink enough water and fluids to keep your pee (urine) clear or pale yellow.  Rest.  Stay home from school, daycare, or work until you have taken medicine for 24 hours.  Only take medicine as told by your doctor.  Take your medicine as told. Finish it even if you start to feel better. GET HELP RIGHT AWAY IF:   You have new problems, such as throwing up (vomiting) or bad headaches.  You have a stiff or painful neck, chest pain, trouble breathing, or trouble swallowing.  You have very bad throat pain, drooling, or changes in your voice.  Your neck puffs up (swells) or gets red and tender.  You have a fever.  You are very tired, your mouth is dry, or you are peeing less than normal.  You cannot wake up completely.  You get a rash, cough, or earache.  You have green, yellow-brown, or bloody spit.  Your pain does not get better with medicine. MAKE SURE YOU:   Understand these instructions.  Will watch your condition.  Will get help right away if you are not doing well or get worse. Document Released: 11/21/2007 Document Revised: 08/27/2011 Document Reviewed: 08/03/2010 [facility] Patient Information 2015 [place]. This information is not  intended to replace advice given to you by your health care provider. Make sure you discuss any questions you have with your health care provider.

## 2014-11-23 NOTE — ED Notes (Signed)
Pt reports sore throat starting yesterday

## 2014-11-23 NOTE — ED Provider Notes (Signed)
[facility] Emergency Department Provider Note ____________________________________________  Time seen: Approximately 11:36 AM  I have reviewed the triage vital signs and the nursing notes.   HISTORY  Chief Complaint Sore Throat   HPI Diane Castillo is a 44 y.o. female presents for complaint of sore throat since yesterday. States "feels like swallowing razor blades." Reports hurts to swallow but continues to drink fluids well. Denies fever. States sore throat 6/10 pain.   Denies cough, congestion, nausea, vomiting, chest pain or shortness of breath.    Past Medical History  Diagnosis Date  . Asthma   . Hypertension     There are no active problems to display for this patient.   No past surgical history on file.  No current outpatient prescriptions on file.  Allergies Penicillins and Tramadol  No family history on file.  Social History History  Substance Use Topics  . Smoking status: Current Every Day Smoker -- 0.50 packs/day    Types: Cigarettes  . Smokeless tobacco: Current User  . Alcohol Use: Yes     Comment: socially    Review of Systems Constitutional: No fever/chills Eyes: No visual changes. ENT: positive for sore throat. Cardiovascular: Denies chest pain. Respiratory: Denies shortness of breath. Gastrointestinal: No abdominal pain.  No nausea, no vomiting.  No diarrhea.  No constipation. Genitourinary: Negative for dysuria. Musculoskeletal: Negative for back pain. Skin: Negative for rash. Neurological: Negative for headaches, focal weakness or numbness.  10-point ROS otherwise negative.  ____________________________________________   PHYSICAL EXAM:  VITAL SIGNS: ED Triage Vitals  Enc Vitals Group     BP 11/23/14 1111 167/97 mmHg     Pulse Rate 11/23/14 1111 67     Resp 11/23/14 1111 18     Temp 11/23/14 1111 98.4 F (36.9 C)     Temp Source 11/23/14 1111 Oral     SpO2 11/23/14 1111 98 %     Weight 11/23/14 1111  222 lb (100.699 kg)     Height 11/23/14 1111  (1.676 m)     Head Cir --      Peak Flow --      Pain Score 11/23/14 1111 10     Pain Loc --      Pain Edu? --      Excl. in GC? --     Constitutional: Alert and oriented. Well appearing and in no acute distress. Eyes: Conjunctivae are normal. PERRL. EOMI. Head: Atraumatic. Nose: No congestion/rhinnorhea. Mouth/Throat: Mucous membranes are moist.  Pharynx erythema with mild to mod tonsillar swelling bilaterally. No uvular shift or deviation.  Neck: No stridor.  No cervical spine tenderness to palpation. Hematological/Lymphatic/Immunilogical: mild anterior cervical lymphadenopathy. Cardiovascular: Normal rate, regular rhythm. Grossly normal heart sounds.  Good peripheral circulation. Respiratory: Normal respiratory effort.  No retractions. Lungs CTAB. Gastrointestinal: Soft and nontender. No distention.  Musculoskeletal: No lower extremity tenderness nor edema.  No joint effusions. Neurologic:  Normal speech and language. No gross focal neurologic deficits are appreciated. Speech is normal. No gait instability. Skin:  Skin is warm, dry and intact. No rash noted. Psychiatric: Mood and affect are normal. Speech and behavior are normal.  ____________________________________________   LABS (all labs ordered are listed, but only abnormal results are displayed)  Labs Reviewed - No data to display________________________________________   INITIAL IMPRESSION / ASSESSMENT AND PLAN / ED COURSE  Pertinent labs & imaging results that were available during my care of the patient were reviewed by me and considered in my medical  decision making (see chart for details).  Well appearing. Drinking fluids well. Sore throat x 2 days. Positive strep. Reports pcn allergic. Will treat with azithromycin. Follow up with PCP. Pt agreed.  ____________________________________________   FINAL CLINICAL IMPRESSION(S) / ED DIAGNOSES  Final diagnoses:   Strep pharyngitis      Renford DillsLindsey Euna Armon, NP 11/23/14 1145  Myrna Blazeravid Matthew Schaevitz, MD 11/23/14 [phone]

## 2014-12-11 ENCOUNTER — Emergency Department: Payer: Medicaid Other

## 2014-12-11 ENCOUNTER — Encounter: Payer: Self-pay | Admitting: Emergency Medicine

## 2014-12-11 ENCOUNTER — Emergency Department
Admission: EM | Admit: 2014-12-11 | Discharge: 2014-12-11 | Disposition: A | Discharge status: Home / Self Care | Payer: Medicaid Other | Attending: Emergency Medicine | Admitting: Emergency Medicine

## 2014-12-11 DIAGNOSIS — S86911A Strain of unspecified muscle(s) and tendon(s) at lower leg level, right leg, initial encounter: Secondary | ICD-10-CM | POA: Diagnosis not present

## 2014-12-11 DIAGNOSIS — Y92009 Unspecified place in unspecified non-institutional (private) residence as the place of occurrence of the external cause: Secondary | ICD-10-CM | POA: Insufficient documentation

## 2014-12-11 DIAGNOSIS — S6991XA Unspecified injury of right wrist, hand and finger(s), initial encounter: Secondary | ICD-10-CM | POA: Insufficient documentation

## 2014-12-11 DIAGNOSIS — W010XXA Fall on same level from slipping, tripping and stumbling without subsequent striking against object, initial encounter: Secondary | ICD-10-CM | POA: Diagnosis not present

## 2014-12-11 DIAGNOSIS — Z720 Tobacco use: Secondary | ICD-10-CM | POA: Diagnosis not present

## 2014-12-11 DIAGNOSIS — Z880 Allergy status to penicillin: Secondary | ICD-10-CM | POA: Diagnosis not present

## 2014-12-11 DIAGNOSIS — I10 Essential (primary) hypertension: Secondary | ICD-10-CM | POA: Diagnosis not present

## 2014-12-11 DIAGNOSIS — Y998 Other external cause status: Secondary | ICD-10-CM | POA: Diagnosis not present

## 2014-12-11 DIAGNOSIS — Y9389 Activity, other specified: Secondary | ICD-10-CM | POA: Diagnosis not present

## 2014-12-11 DIAGNOSIS — S8991XA Unspecified injury of right lower leg, initial encounter: Secondary | ICD-10-CM | POA: Diagnosis present

## 2014-12-11 DIAGNOSIS — Z792 Long term (current) use of antibiotics: Secondary | ICD-10-CM | POA: Insufficient documentation

## 2014-12-11 MED ORDER — KETOROLAC TROMETHAMINE 60 MG/2ML IM SOLN
60.0000 mg | Freq: Once | INTRAMUSCULAR | Status: AC
  Administered 2014-12-11: 60 mg via INTRAMUSCULAR

## 2014-12-11 MED ORDER — OXYCODONE-ACETAMINOPHEN 5-325 MG PO TABS
ORAL_TABLET | ORAL | Status: AC
  Filled 2014-12-11: qty 1

## 2014-12-11 MED ORDER — OXYCODONE-ACETAMINOPHEN 5-325 MG PO TABS
ORAL_TABLET | ORAL | Status: AC
  Administered 2014-12-11: 2 via ORAL
  Filled 2014-12-11: qty 1

## 2014-12-11 MED ORDER — OXYCODONE-ACETAMINOPHEN 5-325 MG PO TABS: 1.0000 | ORAL_TABLET | ORAL | Status: DC | PRN

## 2014-12-11 MED ORDER — KETOROLAC TROMETHAMINE 60 MG/2ML IM SOLN
INTRAMUSCULAR | Status: AC
  Filled 2014-12-11: qty 2

## 2014-12-11 MED ORDER — OXYCODONE-ACETAMINOPHEN 5-325 MG PO TABS
2.0000 | ORAL_TABLET | Freq: Once | ORAL | Status: AC
  Administered 2014-12-11: 2 via ORAL

## 2014-12-11 NOTE — ED Notes (Signed)
Pt reports falling 2 days ago. Pt states she was tripped by her dog and fell to right side. Pt with pain to right leg, knee and right hand. Some swelling noted to right hand. Pain with moving fingers pulse palpable. Pain 10/10

## 2014-12-11 NOTE — ED Provider Notes (Signed)
[facility] Emergency Department Provider Note  ____________________________________________  Time seen: Approximately 11:11 AM  I have reviewed the triage vital signs and the nursing notes.   HISTORY  Chief Complaint Fall; Knee Pain; and Hand Pain    HPI Diane Castillo is a 44 y.o. female who presents for evaluation of pain in her right knee after falling home. Patient states she was tripped by her dog complains of pain 10 over 10.Medical history significant for right anterior cruciate ligament repair.   Past Medical History  Diagnosis Date  . Asthma   . Hypertension     There are no active problems to display for this patient.   No past surgical history on file.  Current Outpatient Rx  Name  Route  Sig  Dispense  Refill  . azithromycin (ZITHROMAX Z-PAK) 250 MG tablet      Take 2 tablets (500 mg) on  Day 1,  followed by 1 tablet (250 mg) once daily on Days 2 through 5.   6 each   0   . ibuprofen (ADVIL,MOTRIN) 800 MG tablet   Oral   Take 1 tablet (800 mg total) by mouth every 8 (eight) hours as needed for mild pain or moderate pain.   15 tablet   0   . oxyCODONE-acetaminophen (ROXICET) 5-325 MG per tablet   Oral   Take 1-2 tablets by mouth every 4 (four) hours as needed for severe pain.   15 tablet   0     Allergies Penicillins and Tramadol  No family history on file.  Social History History  Substance Use Topics  . Smoking status: Current Every Day Smoker -- 0.50 packs/day    Types: Cigarettes  . Smokeless tobacco: Current User  . Alcohol Use: Yes     Comment: socially    Review of Systems Constitutional: No fever/chills Eyes: No visual changes. ENT: No sore throat. Cardiovascular: Denies chest pain. Respiratory: Denies shortness of breath. Gastrointestinal: No abdominal pain.  No nausea, no vomiting.  No diarrhea.  No constipation. Genitourinary: Negative for dysuria. Musculoskeletal: Right knee pain Skin: Negative  for rash. Neurological: Negative for headaches, focal weakness or numbness.  10-point ROS otherwise negative.  ____________________________________________   PHYSICAL EXAM:  VITAL SIGNS: ED Triage Vitals  Enc Vitals Group     BP 12/11/14 1043 134/92 mmHg     Pulse Rate 12/11/14 1042 70     Resp 12/11/14 1042 18     Temp 12/11/14 1042 99.5 F (37.5 C)     Temp Source 12/11/14 1042 Oral     SpO2 12/11/14 1042 97 %     Weight 12/11/14 1042 203 lb (92.08 kg)     Height 12/11/14 1042  (1.651 m)     Head Cir --      Peak Flow --      Pain Score 12/11/14 1043 10     Pain Loc --      Pain Edu? --      Excl. in GC? --     Constitutional: Alert and oriented. Well appearing and in no acute distress. Eyes: Conjunctivae are normal. PERRL. EOMI. Head: Atraumatic. Nose: No congestion/rhinnorhea. Mouth/Throat: Mucous membranes are moist.  Oropharynx non-erythematous. Neck: No stridor.   Cardiovascular: Normal rate, regular rhythm. Grossly normal heart sounds.  Good peripheral circulation. Respiratory: Normal respiratory effort.  No retractions. Lungs CTAB. Gastrointestinal: Soft and nontender. No distention. No abdominal bruits. No CVA tenderness. Musculoskeletal: No lower extremity tenderness nor edema.  No joint effusions. Neurologic:  Normal speech and language. No gross focal neurologic deficits are appreciated. Speech is normal. No gait instability. Skin:  Skin is warm, dry and intact. No rash noted. Psychiatric: Mood and affect are normal. Speech and behavior are normal.  ____________________________________________   LABS (all labs ordered are listed, but only abnormal results are displayed)  Labs Reviewed - No data to display ____________________________________________  EKG  none ____________________________________________  RADIOLOGY  No acute fracture or dislocation noted as interpreted by radiologist reviewed by  myself. ____________________________________________   PROCEDURES  Procedure(s) performed: None  Critical Care performed: No  ____________________________________________   INITIAL IMPRESSION / ASSESSMENT AND PLAN / ED COURSE  Pertinent labs & imaging results that were available during my care of the patient were reviewed by me and considered in my medical decision making (see chart for details).  Acute right knee contusion. Immobilizer given. Reassurance provided Rx given for oxycodone and ibuprofen. She is encouraged to keep her orthopedic appointment on July 11. ____________________________________________   FINAL CLINICAL IMPRESSION(S) / ED DIAGNOSES  Final diagnoses:  Knee strain, right, initial encounter      Evangeline Dakin, PA-C 12/11/14 1555  Sharman Cheek, MD 12/14/14 [phone]

## 2014-12-11 NOTE — ED Notes (Signed)
Pt states her dog tripped her up and she fell landing on right knee and right hand, c/o right knee pain and right hand pain at present

## 2014-12-11 NOTE — Discharge Instructions (Signed)
Knee Pain The knee is the complex joint between your thigh and your lower leg. It is made up of bones, tendons, ligaments, and cartilage. The bones that make up the knee are:  The femur in the thigh.  The tibia and fibula in the lower leg.  The patella or kneecap riding in the groove on the lower femur. CAUSES  Knee pain is a common complaint with many causes. A few of these causes are:  Injury, such as:  A ruptured ligament or tendon injury.  Torn cartilage.  Medical conditions, such as:  Gout  Arthritis  Infections  Overuse, over training, or overdoing a physical activity. Knee pain can be minor or severe. Knee pain can accompany debilitating injury. Minor knee problems often respond well to self-care measures or get well on their own. More serious injuries may need medical intervention or even surgery. SYMPTOMS The knee is complex. Symptoms of knee problems can vary widely. Some of the problems are:  Pain with movement and weight bearing.  Swelling and tenderness.  Buckling of the knee.  Inability to straighten or extend your knee.  Your knee locks and you cannot straighten it.  Warmth and redness with pain and fever.  Deformity or dislocation of the kneecap. DIAGNOSIS  Determining what is wrong may be very straight forward such as when there is an injury. It can also be challenging because of the complexity of the knee. Tests to make a diagnosis may include:  Your caregiver taking a history and doing a physical exam.  Routine X-rays can be used to rule out other problems. X-rays will not reveal a cartilage tear. Some injuries of the knee can be diagnosed by:  Arthroscopy a surgical technique by which a small video camera is inserted through tiny incisions on the sides of the knee. This procedure is used to examine and repair internal knee joint problems. Tiny instruments can be used during arthroscopy to repair the torn knee cartilage (meniscus).  Arthrography  is a radiology technique. A contrast liquid is directly injected into the knee joint. Internal structures of the knee joint then become visible on X-ray film.  An MRI scan is a non X-ray radiology procedure in which magnetic fields and a computer produce two- or three-dimensional images of the inside of the knee. Cartilage tears are often visible using an MRI scanner. MRI scans have largely replaced arthrography in diagnosing cartilage tears of the knee.  Blood work.  Examination of the fluid that helps to lubricate the knee joint (synovial fluid). This is done by taking a sample out using a needle and a syringe. TREATMENT The treatment of knee problems depends on the cause. Some of these treatments are:  Depending on the injury, proper casting, splinting, surgery, or physical therapy care will be needed.  Give yourself adequate recovery time. Do not overuse your joints. If you begin to get sore during workout routines, back off. Slow down or do fewer repetitions.  For repetitive activities such as cycling or running, maintain your strength and nutrition.  Alternate muscle groups. For example, if you are a weight lifter, work the upper body on one day and the lower body the next.  Either tight or weak muscles do not give the proper support for your knee. Tight or weak muscles do not absorb the stress placed on the knee joint. Keep the muscles surrounding the knee strong.  Take care of mechanical problems.  If you have flat feet, orthotics or special shoes may help.  See your caregiver if you need help. °· Arch supports, sometimes with wedges on the inner or outer aspect of the heel, can help. These can shift pressure away from the side of the knee most bothered by osteoarthritis. °· A brace called an "unloader" brace also may be used to help ease the pressure on the most arthritic side of the knee. °· If your caregiver has prescribed crutches, braces, wraps or ice, use as directed. The acronym  for this is PRICE. This means protection, rest, ice, compression, and elevation. °· Nonsteroidal anti-inflammatory drugs (NSAIDs), can help relieve pain. But if taken immediately after an injury, they may actually increase swelling. Take NSAIDs with food in your stomach. Stop them if you develop stomach problems. Do not take these if you have a history of ulcers, stomach pain, or bleeding from the bowel. Do not take without your caregiver's approval if you have problems with fluid retention, heart failure, or kidney problems. °· For ongoing knee problems, physical therapy may be helpful. °· Glucosamine and chondroitin are over-the-counter dietary supplements. Both may help relieve the pain of osteoarthritis in the knee. These medicines are different from the usual anti-inflammatory drugs. Glucosamine may decrease the rate of cartilage destruction. °· Injections of a corticosteroid drug into your knee joint may help reduce the symptoms of an arthritis flare-up. They may provide pain relief that lasts a few months. You may have to wait a few months between injections. The injections do have a small increased risk of infection, water retention, and elevated blood sugar levels. °· Hyaluronic acid injected into damaged joints may ease pain and provide lubrication. These injections may work by reducing inflammation. A series of shots may give relief for as long as 6 months. °· Topical painkillers. Applying certain ointments to your skin may help relieve the pain and stiffness of osteoarthritis. Ask your pharmacist for suggestions. Many over the-counter products are approved for temporary relief of arthritis pain. °· In some countries, doctors often prescribe topical NSAIDs for relief of chronic conditions such as arthritis and tendinitis. A review of treatment with NSAID creams found that they worked as well as oral medications but without the serious side effects. °PREVENTION °· Maintain a healthy weight. Extra pounds  put more strain on your joints. °· Get strong, stay limber. Weak muscles are a common cause of knee injuries. Stretching is important. Include flexibility exercises in your workouts. °· Be smart about exercise. If you have osteoarthritis, chronic knee pain or recurring injuries, you may need to change the way you exercise. This does not mean you have to stop being active. If your knees ache after jogging or playing basketball, consider switching to swimming, water aerobics, or other low-impact activities, at least for a few days a week. Sometimes limiting high-impact activities will provide relief. °· Make sure your shoes fit well. Choose footwear that is right for your sport. °· Protect your knees. Use the proper gear for knee-sensitive activities. Use kneepads when playing volleyball or laying carpet. Buckle your seat belt every time you drive. Most shattered kneecaps occur in car accidents. °· Rest when you are tired. °SEEK MEDICAL CARE IF:  °You have knee pain that is continual and does not seem to be getting better.  °SEEK IMMEDIATE MEDICAL CARE IF:  °Your knee joint feels hot to the touch and you have a high fever. °MAKE SURE YOU:  °· Understand these instructions. °· Will watch your condition. °· Will get help right away if you are not   doing well or get worse. Document Released: 04/01/2007 Document Revised: 08/27/2011 Document Reviewed: 04/01/2007 [facility] Patient Information 2015 [place], [place]. This information is not intended to replace advice given to you by your health care provider. Make sure you discuss any questions you have with your health care provider.  Knee Effusion The medical term for having fluid in your knee is effusion. This is often due to an internal derangement of the knee. This means something is wrong inside the knee. Some of the causes of fluid in the knee may be torn cartilage, a torn ligament, or bleeding into the joint from an injury. Your knee is likely more difficult to bend  and move. This is often because there is increased pain and pressure in the joint. The time it takes for recovery from a knee effusion depends on different factors, including:   Type of injury.  Your age.  Physical and medical conditions.  Rehabilitation Strategies. How long you will be away from your normal activities will depend on what kind of knee problem you have and how much damage is present. Your knee has two types of cartilage. Articular cartilage covers the bone ends and lets your knee bend and move smoothly. Two menisci, thick pads of cartilage that form a rim inside the joint, help absorb shock and stabilize your knee. Ligaments bind the bones together and support your knee joint. Muscles move the joint, help support your knee, and take stress off the joint itself. CAUSES  Often an effusion in the knee is caused by an injury to one of the menisci. This is often a tear in the cartilage. Recovery after a meniscus injury depends on how much meniscus is damaged and whether you have damaged other knee tissue. Small tears may heal on their own with conservative treatment. Conservative means rest, limited weight bearing activity and muscle strengthening exercises. Your recovery may take up to 6 weeks.  TREATMENT  Larger tears may require surgery. Meniscus injuries may be treated during arthroscopy. Arthroscopy is a procedure in which your surgeon uses a small telescope like instrument to look in your knee. Your caregiver can make a more accurate diagnosis (learning what is wrong) by performing an arthroscopic procedure. If your injury is on the inner margin of the meniscus, your surgeon may trim the meniscus back to a smooth rim. In other cases your surgeon will try to repair a damaged meniscus with stitches (sutures). This may make rehabilitation take longer, but may provide better long term result by helping your knee keep its shock absorption capabilities. Ligaments which are completely torn  usually require surgery for repair. HOME CARE INSTRUCTIONS  Use crutches as instructed.  If a brace is applied, use as directed.  Once you are home, an ice pack applied to your swollen knee may help with discomfort and help decrease swelling.  Keep your knee raised (elevated) when you are not up and around or on crutches.  Only take over-the-counter or prescription medicines for pain, discomfort, or fever as directed by your caregiver.  Your caregivers will help with instructions for rehabilitation of your knee. This often includes strengthening exercises.  You may resume a normal diet and activities as directed. SEEK MEDICAL CARE IF:   There is increased swelling in your knee.  You notice redness, swelling, or increasing pain in your knee.  An unexplained oral temperature above 102 F (38.9 C) develops. SEEK IMMEDIATE MEDICAL CARE IF:   You develop a rash.  You have difficulty breathing.  You have any allergic reactions from medications you may have been given.  There is severe pain with any motion of the knee. MAKE SURE YOU:   Understand these instructions.  Will watch your condition.  Will get help right away if you are not doing well or get worse. Document Released: 08/25/2003 Document Revised: 08/27/2011 Document Reviewed: 10/29/2007 [facility] Patient Information 2015 [place], [place]. This information is not intended to replace advice given to you by your health care provider. Make sure you discuss any questions you have with your health care provider.

## 2015-01-22 ENCOUNTER — Encounter: Payer: Self-pay | Admitting: Emergency Medicine

## 2015-01-22 ENCOUNTER — Emergency Department
Admission: EM | Admit: 2015-01-22 | Discharge: 2015-01-22 | Disposition: A | Discharge status: Home / Self Care | Payer: Medicaid Other | Attending: Emergency Medicine | Admitting: Emergency Medicine

## 2015-01-22 DIAGNOSIS — Y9289 Other specified places as the place of occurrence of the external cause: Secondary | ICD-10-CM | POA: Insufficient documentation

## 2015-01-22 DIAGNOSIS — S01511A Laceration without foreign body of lip, initial encounter: Secondary | ICD-10-CM

## 2015-01-22 DIAGNOSIS — Z720 Tobacco use: Secondary | ICD-10-CM | POA: Insufficient documentation

## 2015-01-22 DIAGNOSIS — Y9389 Activity, other specified: Secondary | ICD-10-CM | POA: Diagnosis not present

## 2015-01-22 DIAGNOSIS — W1839XA Other fall on same level, initial encounter: Secondary | ICD-10-CM | POA: Diagnosis not present

## 2015-01-22 DIAGNOSIS — Z880 Allergy status to penicillin: Secondary | ICD-10-CM | POA: Insufficient documentation

## 2015-01-22 DIAGNOSIS — Z7982 Long term (current) use of aspirin: Secondary | ICD-10-CM | POA: Insufficient documentation

## 2015-01-22 DIAGNOSIS — Z23 Encounter for immunization: Secondary | ICD-10-CM | POA: Diagnosis not present

## 2015-01-22 DIAGNOSIS — I10 Essential (primary) hypertension: Secondary | ICD-10-CM | POA: Diagnosis not present

## 2015-01-22 DIAGNOSIS — Y998 Other external cause status: Secondary | ICD-10-CM | POA: Diagnosis not present

## 2015-01-22 MED ORDER — IBUPROFEN 800 MG PO TABS: 800.0000 mg | ORAL_TABLET | Freq: Three times a day (TID) | ORAL | Status: DC | PRN

## 2015-01-22 MED ORDER — TETANUS-DIPHTH-ACELL PERTUSSIS 5-2.5-18.5 LF-MCG/0.5 IM SUSP
0.5000 mL | Freq: Once | INTRAMUSCULAR | Status: AC
  Administered 2015-01-22: 0.5 mL via INTRAMUSCULAR
  Filled 2015-01-22: qty 0.5

## 2015-01-22 MED ORDER — CLINDAMYCIN HCL 150 MG PO CAPS
300.0000 mg | ORAL_CAPSULE | Freq: Once | ORAL | Status: AC
  Administered 2015-01-22: 300 mg via ORAL
  Filled 2015-01-22: qty 2

## 2015-01-22 MED ORDER — CLINDAMYCIN HCL 300 MG PO CAPS: 300.0000 mg | ORAL_CAPSULE | Freq: Three times a day (TID) | ORAL | Status: DC

## 2015-01-22 MED ORDER — LIDOCAINE-EPINEPHRINE 2 %-1:100000 IJ SOLN
1.7000 mL | Freq: Once | INTRAMUSCULAR | Status: DC
  Filled 2015-01-22: qty 1.7

## 2015-01-22 MED ORDER — OXYCODONE-ACETAMINOPHEN 5-325 MG PO TABS: 1.0000 | ORAL_TABLET | Freq: Four times a day (QID) | ORAL | Status: DC | PRN

## 2015-01-22 MED ORDER — OXYCODONE-ACETAMINOPHEN 5-325 MG PO TABS
1.0000 | ORAL_TABLET | Freq: Once | ORAL | Status: AC
Start: 2015-01-22 — End: 2015-01-22
  Administered 2015-01-22: 1 via ORAL
  Filled 2015-01-22: qty 1

## 2015-01-22 NOTE — Discharge Instructions (Signed)
Mouth Laceration A mouth laceration is a cut inside the mouth.  HOME CARE  Rinse your mouth with warm salt water 4 to 6 times a day.  Brush your teeth as usual if you can.  Do not eat hot food or have hot drinks while your mouth is still numb.  Avoid acidic foods or other foods that bother your cut.  Only take medicine as told by your doctor.  Keep all doctor visits as told.  If there are stitches (sutures) in the mouth, do not play with them with your tongue. You may need a tetanus shot if:  You cannot remember when you had your last tetanus shot.  You have never had a tetanus shot. If you need a tetanus shot and you choose not to have one, you may get tetanus. Sickness from tetanus can be serious. GET HELP RIGHT AWAY IF:   Your cut or other parts of your face are puffy (swollen) or painful.  You have a fever.  Your throat is puffy or tender.  Your cut breaks open after stitches have been removed.  You see yellowish-white fluid (pus) coming from the cut. MAKE SURE YOU:   Understand these instructions.  Will watch your condition.  Will get help right away if you are not doing well or get worse. Document Released: 11/21/2007 Document Revised: 10/19/2013 Document Reviewed: 12/07/2010 [facility] Patient Information 2015 [place], [place]. This information is not intended to replace advice given to you by your health care provider. Make sure you discuss any questions you have with your health care provider.    Rinse mouth with a solution of one half peroxide and one half water at least 4 times a day. Watch for worsening infection. Return to ER for any concerns. Take antibiotics and pain medicine as directed.

## 2015-01-22 NOTE — ED Provider Notes (Addendum)
[facility] Emergency Department Provider Note  ____________________________________________  Time seen: Approximately 3:37 PM  I have reviewed the triage vital signs and the nursing notes.   HISTORY  Chief Complaint Lip Laceration    HPI Diane Castillo is a 44 y.o. female with history of hypertension and asthma who reports fallingapproximately 2 days ago injuring her right upper lip. She denies any dental injury or neck pain. She has a laceration to her right upper lip that has become odorous with increased swelling and pain. No fevers and chills. No ear pain. Otherwise she denies any other injuries from the fall, except her knee.   Past Medical History  Diagnosis Date  . Asthma   . Hypertension     There are no active problems to display for this patient.   Past Surgical History  Procedure Laterality Date  . Knee surgery    . Hand surgery    . Abdominal hysterectomy      Current Outpatient Rx  Name  Route  Sig  Dispense  Refill  . aspirin 81 MG tablet   Oral   Take 81 mg by mouth daily.         Marland Kitchen azithromycin (ZITHROMAX Z-PAK) 250 MG tablet      Take 2 tablets (500 mg) on  Day 1,  followed by 1 tablet (250 mg) once daily on Days 2 through 5.   6 each   0   . clindamycin (CLEOCIN) 300 MG capsule   Oral   Take 1 capsule (300 mg total) by mouth 3 (three) times daily.   30 capsule   0   . ibuprofen (ADVIL,MOTRIN) 800 MG tablet   Oral   Take 1 tablet (800 mg total) by mouth every 8 (eight) hours as needed.   15 tablet   0   . oxyCODONE-acetaminophen (ROXICET) 5-325 MG per tablet   Oral   Take 1 tablet by mouth every 6 (six) hours as needed.   20 tablet   0     Allergies Penicillins and Tramadol  No family history on file.  Social History History  Substance Use Topics  . Smoking status: Current Every Day Smoker -- 0.50 packs/day    Types: Cigarettes  . Smokeless tobacco: Current User  . Alcohol Use: Yes     Comment:  socially    Review of Systems Constitutional: No fever/chills Eyes: No visual changes. ENT: No sore throat. Cardiovascular: Denies chest pain. Respiratory: Denies shortness of breath. Gastrointestinal: No abdominal pain.  No nausea, no vomiting.  No diarrhea.  No constipation. Musculoskeletal: Negative for back pain. Skin: Negative for rash. Neurological: Negative for headaches, focal weakness or numbness.  10-point ROS otherwise negative.  ____________________________________________   PHYSICAL EXAM:  VITAL SIGNS: ED Triage Vitals  Enc Vitals Group     BP 01/22/15 1344 161/102 mmHg     Pulse Rate 01/22/15 1344 62     Resp 01/22/15 1344 18     Temp 01/22/15 1344 98.2 F (36.8 C)     Temp Source 01/22/15 1344 Oral     SpO2 01/22/15 1344 100 %     Weight 01/22/15 1344 192 lb (87.091 kg)     Height 01/22/15 1344 5\' 5"  (1.651 m)     Head Cir --      Peak Flow --      Pain Score 01/22/15 1345 8     Pain Loc --      Pain Edu? --  Excl. in GC? --     Constitutional: Alert and oriented. Well appearing and in no acute distress. Eyes: Conjunctivae are normal. PERRL. EOMI. Head: Atraumatic. Small abrasion to the right cheek. Nose: No congestion/rhinnorhea. Mouth/Throat: Mucous membranes are moist.  Oropharynx non-erythematous. Open laceration to right upper lip with gray colored odorous tissue with tenderness and swelling. Extending halfway through the upper lip. Did not cross the vermilion border. No dental tenderness or injury into the gumline. Neck: supple. No adenopathy. Cardiovascular: Normal rate, regular rhythm. Grossly normal heart sounds.  Good peripheral circulation. Respiratory: Normal respiratory effort.  No retractions. Lungs CTAB. Gastrointestinal: Soft and nontender. No distention. No abdominal bruits. No CVA tenderness.Neurologic:  Normal speech and language. No gross focal neurologic deficits are appreciated. No gait instability. Psychiatric: Mood and  affect are normal. Speech and behavior are normal.  ____________________________________________   LABS (all labs ordered are listed, but only abnormal results are displayed)  Labs Reviewed - No data to display ____________________________________________  EKG    ____________________________________________  RADIOLOGY    ____________________________________________   PROCEDURES  Procedure(s) performed: None  Critical Care performed: No  ____________________________________________   INITIAL IMPRESSION / ASSESSMENT AND PLAN / ED COURSE  Pertinent labs & imaging results that were available during my care of the patient were reviewed by me and considered in my medical decision making (see chart for details).  44 year old female with lip laceration from a fall approximately 2 days ago. Surgical repair not indicated. Performed a dental block with 2% lidocaine and epinephrine in the infra orbital rgion, right side. Tolerated well with successful anesthesia. Then cleaned with saline extensively. Given clindamycin 300 mg once. He she will continue 300 mg 3 times a day. Also given ibuprofen and Percocet for pain control. She understands to return to the emergency room for increasing pain, swelling, redness or fevers and chills. ____________________________________________   FINAL CLINICAL IMPRESSION(S) / ED DIAGNOSES  Final diagnoses:  Lip laceration, initial encounter      Ignacia Bayley, PA-C 01/22/15 1544  Sharman Cheek, MD 01/22/15 1610  Sharman Cheek, MD 03/04/15 2303
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
## 2015-02-17 ENCOUNTER — Emergency Department: Payer: Medicaid Other

## 2015-02-17 ENCOUNTER — Emergency Department
Admission: EM | Admit: 2015-02-17 | Discharge: 2015-02-17 | Disposition: A | Discharge status: Home / Self Care | Payer: Medicaid Other | Attending: Emergency Medicine | Admitting: Emergency Medicine

## 2015-02-17 DIAGNOSIS — Y998 Other external cause status: Secondary | ICD-10-CM | POA: Insufficient documentation

## 2015-02-17 DIAGNOSIS — S53105A Unspecified dislocation of left ulnohumeral joint, initial encounter: Secondary | ICD-10-CM

## 2015-02-17 DIAGNOSIS — S59902A Unspecified injury of left elbow, initial encounter: Secondary | ICD-10-CM | POA: Diagnosis present

## 2015-02-17 DIAGNOSIS — S0011XA Contusion of right eyelid and periocular area, initial encounter: Secondary | ICD-10-CM | POA: Insufficient documentation

## 2015-02-17 DIAGNOSIS — Z7982 Long term (current) use of aspirin: Secondary | ICD-10-CM | POA: Insufficient documentation

## 2015-02-17 DIAGNOSIS — Z880 Allergy status to penicillin: Secondary | ICD-10-CM | POA: Insufficient documentation

## 2015-02-17 DIAGNOSIS — Y9389 Activity, other specified: Secondary | ICD-10-CM | POA: Insufficient documentation

## 2015-02-17 DIAGNOSIS — Z792 Long term (current) use of antibiotics: Secondary | ICD-10-CM | POA: Diagnosis not present

## 2015-02-17 DIAGNOSIS — S00212A Abrasion of left eyelid and periocular area, initial encounter: Secondary | ICD-10-CM | POA: Diagnosis not present

## 2015-02-17 DIAGNOSIS — S0012XA Contusion of left eyelid and periocular area, initial encounter: Secondary | ICD-10-CM | POA: Diagnosis not present

## 2015-02-17 DIAGNOSIS — S53095A Other dislocation of left radial head, initial encounter: Secondary | ICD-10-CM | POA: Insufficient documentation

## 2015-02-17 DIAGNOSIS — Z720 Tobacco use: Secondary | ICD-10-CM | POA: Insufficient documentation

## 2015-02-17 DIAGNOSIS — I10 Essential (primary) hypertension: Secondary | ICD-10-CM | POA: Diagnosis not present

## 2015-02-17 DIAGNOSIS — Y9289 Other specified places as the place of occurrence of the external cause: Secondary | ICD-10-CM | POA: Insufficient documentation

## 2015-02-17 LAB — ETHANOL: Alcohol, Ethyl (B): 175 mg/dL — ABNORMAL HIGH (ref <5)

## 2015-02-17 MED ORDER — MORPHINE SULFATE (PF) 2 MG/ML IV SOLN
2.0000 mg | Freq: Once | INTRAVENOUS | Status: AC
  Administered 2015-02-17: 2 mg via INTRAVENOUS

## 2015-02-17 MED ORDER — OXYCODONE-ACETAMINOPHEN 5-325 MG PO TABS
1.0000 | ORAL_TABLET | Freq: Once | ORAL | Status: AC
  Administered 2015-02-17: 1 via ORAL

## 2015-02-17 MED ORDER — MORPHINE SULFATE (PF) 2 MG/ML IV SOLN
INTRAVENOUS | Status: AC
  Filled 2015-02-17: qty 1

## 2015-02-17 MED ORDER — ONDANSETRON HCL 4 MG/2ML IJ SOLN
INTRAMUSCULAR | Status: AC
  Filled 2015-02-17: qty 2

## 2015-02-17 MED ORDER — ONDANSETRON HCL 4 MG/2ML IJ SOLN
4.0000 mg | Freq: Once | INTRAMUSCULAR | Status: AC
  Administered 2015-02-17: 4 mg via INTRAVENOUS

## 2015-02-17 MED ORDER — OXYCODONE-ACETAMINOPHEN 5-325 MG PO TABS
ORAL_TABLET | ORAL | Status: AC
  Filled 2015-02-17: qty 1
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
## 2015-02-17 NOTE — Discharge Instructions (Signed)
Elbow Dislocation  Elbow dislocation is the displacement of the bones that form the elbow joint. Three bones come together to form the elbow. The humerus is the bone in the upper arm. The radius and ulna are the 2 bones in the forearm that form the lower part of the elbow. The elbow is held in place by very strong, fibrous tissues (ligaments) that connect the bones to each other.  CAUSES  Elbow dislocations are not common. Typically, they occur when a person falls forward with hands and elbows outstretched. The force of the impact is sent to the elbow. Usually, there is a twisting motion in this force. Elbow dislocations also happen during car crashes when passengers reach out to brace themselves during the impact.  RISK FACTORS  Although dislocation of the elbow can happen to anyone, some people are at greater risk than others. People at increased risk of elbow dislocation include:  · People born with greater looseness in their ligaments.  · People born with an ulna bone that has a shallow groove for the elbow hinge joint.  SYMPTOMS  Symptoms of a complete elbow dislocation usually are obvious. They include extreme pain and the appearance of a deformed arm.   Symptoms of a partial dislocation may not be obvious. Your elbow may move somewhat, but you may have pain and swelling. Also, there will likely be bruising on the inside and outside of your elbow where ligaments have been stretched or torn.   DIAGNOSIS   To diagnose elbow dislocation, your caregiver will perform a physical exam. During this exam, your caregiver will check your arm for tenderness, swelling, and deformity. The skin around your arm and the circulation in your arm also will be checked. Your pulse will be checked at your wrist. If your artery is injured during dislocation, your hand will be cool to the touch and may be white or purple in color. Your caregiver also may check your arm and your ability to move your wrist and fingers to see if you had  any damage to your nerves during dislocation.  An X-ray exam also may be done to determine if there is bone injury. Results of an X-ray exam can help show the direction of the dislocation.  If you have a simple dislocation, there is no major bone injury. If you have a complex dislocation, you may have broken bones (fractures) associated with the ligament injuries.  TREATMENT  For a simple elbow dislocation, your bones can usually be realigned in a procedure called a reduction. This is a treatment in which your bones are manually moved back into place either with the use of numbing medicine (regional anesthetic) around your elbow or medicine to make you sleep (general anesthetic). Then your elbow is kept immobile with a sling or a splint for 2 to 3 weeks. This is followed with physical therapy to help your joint move again.  Complex elbow dislocation may require surgery to restore joint alignment and repair ligaments. After surgery, your elbow may be protected with an external hinge. This device keeps your elbow from dislocating again while motion exercises are done. Additional surgery may be needed to repair any injuries to blood vessels and nerves or bones and ligaments or to relieve pressure from excessive swelling around the muscles.  HOME CARE INSTRUCTIONS  The following measures can help to reduce pain and hasten the healing process:  · Rest your injured joint. Do not move it. Avoid activities similar to the one that caused   for 1 to 2 days after your reduction or as directed by your caregiver. Applying ice helps to reduce inflammation and pain.  Put ice in a plastic bag.  Place a towel between your skin and the bag.  Leave the ice on for 15 to 20 minutes at a time, every couple of hours while you are awake.  Elevate your arm above your heart and move your wrist and fingers as instructed by  your caregiver to help limit swelling.  Take over-the-counter or prescription medicines for pain as directed by your caregiver. SEEK IMMEDIATE MEDICAL CARE IF:  Your splint becomes damaged.  You have an external hinge and it becomes loose or will not move.  You have an external hinge and you develop drainage around the pins.  Your pain becomes worse rather than better.  You lose feeling in your hand or fingers. MAKE SURE YOU:  Understand these instructions.  Will watch your condition.  Will get help right away if you are not doing well or get worse. Document Released: 05/29/2001 Document Revised: 08/27/2011 Document Reviewed: 11/02/2010 [facility] Patient Information 2015 [place], [place]. This information is not intended to replace advice given to you by your health care provider. Make sure you discuss any questions you have with your health care provider.  Facial or Scalp Contusion A facial or scalp contusion is a deep bruise on the face or head. Injuries to the face and head generally cause a lot of swelling, especially around the eyes. Contusions are the result of an injury that caused bleeding under the skin. The contusion may turn blue, purple, or yellow. Minor injuries will give you a painless contusion, but more severe contusions may stay painful and swollen for a few weeks.  CAUSES  A facial or scalp contusion is caused by a blunt injury or trauma to the face or head area.  SIGNS AND SYMPTOMS   Swelling of the injured area.   Discoloration of the injured area.   Tenderness, soreness, or pain in the injured area.  DIAGNOSIS  The diagnosis can be made by taking a medical history and doing a physical exam. An X-ray exam, CT scan, or MRI may be needed to determine if there are any associated injuries, such as broken bones (fractures). TREATMENT  Often, the best treatment for a facial or scalp contusion is applying cold compresses to the injured area. Over-the-counter  medicines may also be recommended for pain control.  HOME CARE INSTRUCTIONS   Only take over-the-counter or prescription medicines as directed by your health care provider.   Apply ice to the injured area.   Put ice in a plastic bag.   Place a towel between your skin and the bag.   Leave the ice on for 20 minutes, 2-3 times a day.  SEEK MEDICAL CARE IF:  You have bite problems.   You have pain with chewing.   You are concerned about facial defects. SEEK IMMEDIATE MEDICAL CARE IF:  You have severe pain or a headache that is not relieved by medicine.   You have unusual sleepiness, confusion, or personality changes.   You throw up (vomit).   You have a persistent nosebleed.   You have double vision or blurred vision.   You have fluid drainage from your nose or ear.   You have difficulty walking or using your arms or legs.  MAKE SURE YOU:   Understand these instructions.  Will watch your condition.  Will get help right away if you are  not doing well or get worse. Document Released: 07/12/2004 Document Revised: 03/25/2013 Document Reviewed: 01/15/2013 [facility] Patient Information 2015 [place], [place]. This information is not intended to replace advice given to you by your health care provider. Make sure you discuss any questions you have with your health care provider.  Assault, General Assault includes any behavior, whether intentional or reckless, which results in bodily injury to another person and/or damage to property. Included in this would be any behavior, intentional or reckless, that by its nature would be understood (interpreted) by a reasonable person as intent to harm another person or to damage his/her property. Threats may be oral or written. They may be communicated through regular mail, computer, fax, or phone. These threats may be direct or implied. FORMS OF ASSAULT INCLUDE:  Physically assaulting a person. This includes physical threats to  inflict physical harm as well as:  Slapping.  Hitting.  Poking.  Kicking.  Punching.  Pushing.  Arson.  Sabotage.  Equipment vandalism.  Damaging or destroying property.  Throwing or hitting objects.  Displaying a weapon or an object that appears to be a weapon in a threatening manner.  Carrying a firearm of any kind.  Using a weapon to harm someone.  Using greater physical size/strength to intimidate another.  Making intimidating or threatening gestures.  Bullying.  Hazing.  Intimidating, threatening, hostile, or abusive language directed toward another person.  It communicates the intention to engage in violence against that person. And it leads a reasonable person to expect that violent behavior may occur.  Stalking another person. IF IT HAPPENS AGAIN:  Immediately call for emergency help (911 in [place]  If someone poses clear and immediate danger to you, seek legal authorities to have a protective or restraining order put in place.  Less threatening assaults can at least be reported to authorities. STEPS TO TAKE IF A SEXUAL ASSAULT HAS HAPPENED  Go to an area of safety. This may include a shelter or staying with a friend. Stay away from the area where you have been attacked. A large percentage of sexual assaults are caused by a friend, relative or associate.  If medications were given by your caregiver, take them as directed for the full length of time prescribed.  Only take over-the-counter or prescription medicines for pain, discomfort, or fever as directed by your caregiver.  If you have come in contact with a sexual disease, find out if you are to be tested again. If your caregiver is concerned about the HIV/AIDS virus, he/she may require you to have continued testing for several months.  For the protection of your privacy, test results can not be given over the phone. Make sure you receive the results of your test. If your test results are not back  during your visit, make an appointment with your caregiver to find out the results. Do not assume everything is normal if you have not heard from your caregiver or the medical facility. It is important for you to follow up on all of your test results.  File appropriate papers with authorities. This is important in all assaults, even if it has occurred in a family or by a friend. SEEK MEDICAL CARE IF:  You have new problems because of your injuries.  You have problems that may be because of the medicine you are taking, such as:  Rash.  Itching.  Swelling.  Trouble breathing.  You develop belly (abdominal) pain, feel sick to your stomach (nausea) or are vomiting.  You  begin to run a temperature.  You need supportive care or referral to a rape crisis center. These are centers with trained personnel who can help you get through this ordeal. SEEK IMMEDIATE MEDICAL CARE IF:  You are afraid of being threatened, beaten, or abused. In [place] call 911.  You receive new injuries related to abuse.  You develop severe pain in any area injured in the assault or have any change in your condition that concerns you.  You faint or lose consciousness.  You develop chest pain or shortness of breath. Document Released: 06/04/2005 Document Revised: 08/27/2011 Document Reviewed: 01/21/2008 [facility] Patient Information 2015 [place], [place]. This information is not intended to replace advice given to you by your health care provider. Make sure you discuss any questions you have with your health care provider.
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
## 2015-03-24 ENCOUNTER — Other Ambulatory Visit: Payer: Self-pay | Admitting: Student

## 2015-03-24 DIAGNOSIS — S53105D Unspecified dislocation of left ulnohumeral joint, subsequent encounter: Secondary | ICD-10-CM

## 2015-04-01 ENCOUNTER — Ambulatory Visit: Payer: Medicaid Other

## 2015-04-14 ENCOUNTER — Ambulatory Visit
Admission: RE | Admit: 2015-04-14 | Discharge: 2015-04-14 | Disposition: A | Discharge status: Home / Self Care | Payer: Medicaid Other | Source: Ambulatory Visit | Attending: Student | Admitting: Student

## 2015-04-14 DIAGNOSIS — S46822A Laceration of other muscles, fascia and tendons at shoulder and upper arm level, left arm, initial encounter: Secondary | ICD-10-CM | POA: Diagnosis not present

## 2015-04-14 DIAGNOSIS — M65832 Other synovitis and tenosynovitis, left forearm: Secondary | ICD-10-CM | POA: Insufficient documentation

## 2015-04-14 DIAGNOSIS — S53105D Unspecified dislocation of left ulnohumeral joint, subsequent encounter: Secondary | ICD-10-CM | POA: Insufficient documentation

## 2015-04-14 DIAGNOSIS — M25422 Effusion, left elbow: Secondary | ICD-10-CM | POA: Insufficient documentation

## 2015-04-14 DIAGNOSIS — M25522 Pain in left elbow: Secondary | ICD-10-CM | POA: Diagnosis present

## 2015-05-09 ENCOUNTER — Emergency Department: Payer: No Typology Code available for payment source

## 2015-05-09 ENCOUNTER — Encounter: Payer: Self-pay | Admitting: Medical Oncology

## 2015-05-09 ENCOUNTER — Emergency Department
Admission: EM | Admit: 2015-05-09 | Discharge: 2015-05-09 | Disposition: A | Discharge status: Home / Self Care | Payer: No Typology Code available for payment source | Attending: Emergency Medicine | Admitting: Emergency Medicine

## 2015-05-09 DIAGNOSIS — Z7982 Long term (current) use of aspirin: Secondary | ICD-10-CM | POA: Diagnosis not present

## 2015-05-09 DIAGNOSIS — S5012XA Contusion of left forearm, initial encounter: Secondary | ICD-10-CM | POA: Insufficient documentation

## 2015-05-09 DIAGNOSIS — F17210 Nicotine dependence, cigarettes, uncomplicated: Secondary | ICD-10-CM | POA: Diagnosis not present

## 2015-05-09 DIAGNOSIS — Y998 Other external cause status: Secondary | ICD-10-CM | POA: Diagnosis not present

## 2015-05-09 DIAGNOSIS — F419 Anxiety disorder, unspecified: Secondary | ICD-10-CM | POA: Insufficient documentation

## 2015-05-09 DIAGNOSIS — S20212A Contusion of left front wall of thorax, initial encounter: Secondary | ICD-10-CM | POA: Diagnosis not present

## 2015-05-09 DIAGNOSIS — Y9389 Activity, other specified: Secondary | ICD-10-CM | POA: Insufficient documentation

## 2015-05-09 DIAGNOSIS — Z880 Allergy status to penicillin: Secondary | ICD-10-CM | POA: Diagnosis not present

## 2015-05-09 DIAGNOSIS — Y92410 Unspecified street and highway as the place of occurrence of the external cause: Secondary | ICD-10-CM | POA: Insufficient documentation

## 2015-05-09 DIAGNOSIS — I10 Essential (primary) hypertension: Secondary | ICD-10-CM | POA: Insufficient documentation

## 2015-05-09 DIAGNOSIS — S29001A Unspecified injury of muscle and tendon of front wall of thorax, initial encounter: Secondary | ICD-10-CM | POA: Diagnosis present

## 2015-05-09 DIAGNOSIS — Z792 Long term (current) use of antibiotics: Secondary | ICD-10-CM | POA: Diagnosis not present

## 2015-05-09 MED ORDER — KETOROLAC TROMETHAMINE 60 MG/2ML IM SOLN
60.0000 mg | Freq: Once | INTRAMUSCULAR | Status: AC
  Administered 2015-05-09: 60 mg via INTRAMUSCULAR
  Filled 2015-05-09: qty 2

## 2015-05-09 MED ORDER — CYCLOBENZAPRINE HCL 10 MG PO TABS: 10.0000 mg | ORAL_TABLET | Freq: Three times a day (TID) | ORAL | Status: DC | PRN

## 2015-05-09 MED ORDER — HYDROMORPHONE HCL 1 MG/ML IJ SOLN
1.0000 mg | Freq: Once | INTRAMUSCULAR | Status: AC
  Administered 2015-05-09: 1 mg via INTRAMUSCULAR
  Filled 2015-05-09: qty 1

## 2015-05-09 MED ORDER — OXYCODONE-ACETAMINOPHEN 5-325 MG PO TABS: 1.0000 | ORAL_TABLET | Freq: Four times a day (QID) | ORAL | Status: DC | PRN

## 2015-05-09 MED ORDER — IBUPROFEN 800 MG PO TABS: 800.0000 mg | ORAL_TABLET | Freq: Three times a day (TID) | ORAL | Status: DC | PRN
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
## 2015-06-22 ENCOUNTER — Emergency Department
Admission: EM | Admit: 2015-06-22 | Discharge: 2015-06-22 | Disposition: A | Discharge status: Home / Self Care | Payer: Medicaid Other | Attending: Emergency Medicine | Admitting: Emergency Medicine

## 2015-06-22 ENCOUNTER — Encounter: Payer: Self-pay | Admitting: Emergency Medicine

## 2015-06-22 DIAGNOSIS — Z7982 Long term (current) use of aspirin: Secondary | ICD-10-CM | POA: Insufficient documentation

## 2015-06-22 DIAGNOSIS — M1711 Unilateral primary osteoarthritis, right knee: Secondary | ICD-10-CM

## 2015-06-22 DIAGNOSIS — M25561 Pain in right knee: Secondary | ICD-10-CM | POA: Diagnosis present

## 2015-06-22 DIAGNOSIS — I10 Essential (primary) hypertension: Secondary | ICD-10-CM | POA: Insufficient documentation

## 2015-06-22 DIAGNOSIS — F17210 Nicotine dependence, cigarettes, uncomplicated: Secondary | ICD-10-CM | POA: Diagnosis not present

## 2015-06-22 DIAGNOSIS — Z880 Allergy status to penicillin: Secondary | ICD-10-CM | POA: Insufficient documentation

## 2015-06-22 MED ORDER — ONDANSETRON 8 MG PO TBDP
8.0000 mg | ORAL_TABLET | Freq: Once | ORAL | Status: AC
  Administered 2015-06-22: 8 mg via ORAL
  Filled 2015-06-22: qty 1

## 2015-06-22 MED ORDER — MELOXICAM 15 MG PO TABS: 15.0000 mg | ORAL_TABLET | Freq: Every day | ORAL | Status: DC

## 2015-06-22 MED ORDER — HYDROMORPHONE HCL 1 MG/ML IJ SOLN
1.0000 mg | Freq: Once | INTRAMUSCULAR | Status: AC
  Administered 2015-06-22: 1 mg via INTRAMUSCULAR
  Filled 2015-06-22: qty 1

## 2015-06-22 MED ORDER — OXYCODONE-ACETAMINOPHEN 5-325 MG PO TABS: 1.0000 | ORAL_TABLET | ORAL | Status: DC | PRN

## 2015-06-22 NOTE — ED Provider Notes (Signed)
[facility] Emergency Department Provider Note  ____________________________________________  Time seen: Approximately 11:49 AM  I have reviewed the triage vital signs and the nursing notes.   HISTORY  Chief Complaint Knee Pain    HPI Diane Castillo is a 45 y.o. female presents with chronic recurrent right knee pain. Patient scheduled to have a total knee replacement but has not scheduled to date yet. Also scheduled to see pain management on February 21 at [facility]. Patient voices continuous pain has not been here since November for the same.   Past Medical History  Diagnosis Date  . Asthma   . Hypertension     There are no active problems to display for this patient.   Past Surgical History  Procedure Laterality Date  . Knee surgery    . Hand surgery    . Abdominal hysterectomy      Current Outpatient Rx  Name  Route  Sig  Dispense  Refill  . aspirin 81 MG tablet   Oral   Take 81 mg by mouth daily.         . meloxicam (MOBIC) 15 MG tablet   Oral   Take 1 tablet (15 mg total) by mouth daily.   30 tablet   0   . oxyCODONE-acetaminophen (ROXICET) 5-325 MG tablet   Oral   Take 1-2 tablets by mouth every 4 (four) hours as needed for severe pain.   25 tablet   0     Allergies Penicillins and Tramadol  No family history on file.  Social History Social History  Substance Use Topics  . Smoking status: Current Every Day Smoker -- 0.50 packs/day    Types: Cigarettes  . Smokeless tobacco: Current User  . Alcohol Use: Yes     Comment: socially    Review of Systems Constitutional: No fever/chills Eyes: No visual changes. ENT: No sore throat. Cardiovascular: Denies chest pain. Respiratory: Denies shortness of breath. Gastrointestinal: No abdominal pain.  No nausea, no vomiting.  No diarrhea.  No constipation. Genitourinary: Negative for dysuria. Musculoskeletal: Positive for right knee pain. Skin: Negative for rash. Neurological:  Negative for headaches, focal weakness or numbness.  10-point ROS otherwise negative.  ____________________________________________   PHYSICAL EXAM:  VITAL SIGNS: ED Triage Vitals  Enc Vitals Group     BP 06/22/15 0952 138/89 mmHg     Pulse Rate 06/22/15 0952 63     Resp 06/22/15 0952 18     Temp 06/22/15 0953 98 F (36.7 C)     Temp Source 06/22/15 0953 Oral     SpO2 06/22/15 0952 98 %     Weight 06/22/15 0952 173 lb (78.472 kg)     Height 06/22/15 0952 5\' 5"  (1.651 m)     Head Cir --      Peak Flow --      Pain Score 06/22/15 0953 10     Pain Loc --      Pain Edu? --      Excl. in GC? --     Constitutional: Alert and oriented. Well appearing and in no acute distress.   Cardiovascular: Normal rate, regular rhythm. Grossly normal heart sounds.  Good peripheral circulation. Respiratory: Normal respiratory effort.  No retractions. Lungs CTAB. Musculoskeletal: No lower extremity tenderness nor edema.  No joint effusions. Neurologic:  Normal speech and language. No gross focal neurologic deficits are appreciated. No gait instability. Skin:  Skin is warm, dry and intact. No rash noted. Psychiatric: Mood and affect  are normal. Speech and behavior are normal.  ____________________________________________   LABS (all labs ordered are listed, but only abnormal results are displayed)  Labs Reviewed - No data to display ____________________________________________  RADIOLOGY: IMPRESSION: Prior ACL reconstruction  Mild to moderate tricompartmental osteoarthritis with a small effusion  PROCEDURES  Procedure(s) performed: None  Critical Care performed: No  ____________________________________________   INITIAL IMPRESSION / ASSESSMENT AND PLAN / ED COURSE  Pertinent labs & imaging results that were available during my care of the patient were reviewed by me and considered in my medical decision making (see chart for details).  Recurrent right knee pain with  osteoarthritis. Rx given for Percocet 5/325. Meloxicam 15 mg daily. [facility] and follow up with PCP as needed. Patient states improvement of prior to discharge after Dilaudid 1 mg and Zofran 8 mg given. An NCCSRS reviewed prior to dispensing any medications.  Patient voices no other emergency medical complaints at this time. ____________________________________________   FINAL CLINICAL IMPRESSION(S) / ED DIAGNOSES  Final diagnoses:  Osteoarthritis of right knee, unspecified osteoarthritis type      Evangeline Dakinharles M Pattrick Bady, PA-C 06/22/15 1211  Sharman CheekPhillip Stafford, MD 06/22/15 [phone]

## 2015-06-22 NOTE — Discharge Instructions (Signed)
Heat Therapy °Heat therapy can help ease sore, stiff, injured, and tight muscles and joints. Heat relaxes your muscles, which may help ease your pain.  °RISKS AND COMPLICATIONS °If you have any of the following conditions, do not use heat therapy unless your health care provider has approved: °· Poor circulation. °· Healing wounds or scarred skin in the area being treated. °· Diabetes, heart disease, or high blood pressure. °· Not being able to feel (numbness) the area being treated. °· Unusual swelling of the area being treated. °· Active infections. °· Blood clots. °· Cancer. °· Inability to communicate pain. This may include young children and people who have problems with their brain function (dementia). °· Pregnancy. °Heat therapy should only be used on old, pre-existing, or long-lasting (chronic) injuries. Do not use heat therapy on new injuries unless directed by your health care provider. °HOW TO USE HEAT THERAPY °There are several different kinds of heat therapy, including: °· Moist heat pack. °· Warm water bath. °· Hot water bottle. °· Electric heating pad. °· Heated gel pack. °· Heated wrap. °· Electric heating pad. °Use the heat therapy method suggested by your health care provider. Follow your health care provider's instructions on when and how to use heat therapy. °GENERAL HEAT THERAPY RECOMMENDATIONS °· Do not sleep while using heat therapy. Only use heat therapy while you are awake. °· Your skin may turn pink while using heat therapy. Do not use heat therapy if your skin turns red. °· Do not use heat therapy if you have new pain. °· High heat or long exposure to heat can cause burns. Be careful when using heat therapy to avoid burning your skin. °· Do not use heat therapy on areas of your skin that are already irritated, such as with a rash or sunburn. °SEEK MEDICAL CARE IF: °· You have blisters, redness, swelling, or numbness. °· You have new pain. °· Your pain is worse. °MAKE SURE  YOU: °· Understand these instructions. °· Will watch your condition. °· Will get help right away if you are not doing well or get worse. °  °This information is not intended to replace advice given to you by your health care provider. Make sure you discuss any questions you have with your health care provider. °  °Document Released: 08/27/2011 Document Revised: 06/25/2014 Document Reviewed: 07/28/2013 °Elsevier Interactive Patient Education ©2016 Elsevier Inc. ° °Osteoarthritis °Osteoarthritis is a disease that causes soreness and inflammation of a joint. It occurs when the cartilage at the affected joint wears down. Cartilage acts as a cushion, covering the ends of bones where they meet to form a joint. Osteoarthritis is the most common form of arthritis. It often occurs in older people. The joints affected most often by this condition include those in the: °· Ends of the fingers. °· Thumbs. °· Neck. °· Lower back. °· Knees. °· Hips. °CAUSES  °Over time, the cartilage that covers the ends of bones begins to wear away. This causes bone to rub on bone, producing pain and stiffness in the affected joints.  °RISK FACTORS °Certain factors can increase your chances of having osteoarthritis, including: °· Older age. °· Excessive body weight. °· Overuse of joints. °· Previous joint injury. °SIGNS AND SYMPTOMS  °· Pain, swelling, and stiffness in the joint. °· Over time, the joint may lose its normal shape. °· Small deposits of bone (osteophytes) may grow on the edges of the joint. °· Bits of bone or cartilage can break off and float inside the   joint space. This may cause more pain and damage. °DIAGNOSIS  °Your health care provider will do a physical exam and ask about your symptoms. Various tests may be ordered, such as: °· X-rays of the affected joint. °· Blood tests to rule out other types of arthritis. °Additional tests may be used to diagnose your condition. °TREATMENT  °Goals of treatment are to control pain and  improve joint function. Treatment plans may include: °· A prescribed exercise program that allows for rest and joint relief. °· A weight control plan. °· Pain relief techniques, such as: °¨ Properly applied heat and cold. °¨ Electric pulses delivered to nerve endings under the skin (transcutaneous electrical nerve stimulation [TENS]). °¨ Massage. °¨ Certain nutritional supplements. °· Medicines to control pain, such as: °¨ Acetaminophen. °¨ Nonsteroidal anti-inflammatory drugs (NSAIDs), such as naproxen. °¨ Narcotic or central-acting agents, such as tramadol. °¨ Corticosteroids. These can be given orally or as an injection. °· Surgery to reposition the bones and relieve pain (osteotomy) or to remove loose pieces of bone and cartilage. Joint replacement may be needed in advanced states of osteoarthritis. °HOME CARE INSTRUCTIONS  °· Take medicines only as directed by your health care provider. °· Maintain a healthy weight. Follow your health care provider's instructions for weight control. This may include dietary instructions. °· Exercise as directed. Your health care provider can recommend specific types of exercise. These may include: °¨ Strengthening exercises. These are done to strengthen the muscles that support joints affected by arthritis. They can be performed with weights or with exercise bands to add resistance. °¨ Aerobic activities. These are exercises, such as brisk walking or low-impact aerobics, that get your heart pumping. °¨ Range-of-motion activities. These keep your joints limber. °¨ Balance and agility exercises. These help you maintain daily living skills. °· Rest your affected joints as directed by your health care provider. °· Keep all follow-up visits as directed by your health care provider. °SEEK MEDICAL CARE IF:  °· Your skin turns red. °· You develop a rash in addition to your joint pain. °· You have worsening joint pain. °· You have a fever along with joint or muscle aches. °SEEK  IMMEDIATE MEDICAL CARE IF: °· You have a significant loss of weight or appetite. °· You have night sweats. °FOR MORE INFORMATION  °· National Institute of Arthritis and Musculoskeletal and Skin Diseases: www.niams.nih.gov °· National Institute on Aging: www.nia.nih.gov °· American College of Rheumatology: www.rheumatology.org °  °This information is not intended to replace advice given to you by your health care provider. Make sure you discuss any questions you have with your health care provider. °  °Document Released: 06/04/2005 Document Revised: 06/25/2014 Document Reviewed: 02/09/2013 °Elsevier Interactive Patient Education ©2016 Elsevier Inc. ° °

## 2015-06-22 NOTE — ED Notes (Signed)
Pt presents with right knee pain. Hx of arthritis in same knee. Injured it this am and now having increased pain in right knee.

## 2015-06-22 NOTE — ED Notes (Signed)
Pt here to ed with c/o R knee pain, pt states she has been battling this for a while now and was told recently that she doesn't have any more cartilage in that knee.

## 2015-08-08 ENCOUNTER — Emergency Department
Admission: EM | Admit: 2015-08-08 | Discharge: 2015-08-08 | Disposition: A | Discharge status: Home / Self Care | Payer: Medicaid Other | Attending: Emergency Medicine | Admitting: Emergency Medicine

## 2015-08-08 ENCOUNTER — Encounter: Payer: Self-pay | Admitting: *Deleted

## 2015-08-08 DIAGNOSIS — G8929 Other chronic pain: Secondary | ICD-10-CM | POA: Insufficient documentation

## 2015-08-08 DIAGNOSIS — M25561 Pain in right knee: Secondary | ICD-10-CM | POA: Diagnosis not present

## 2015-08-08 DIAGNOSIS — F17210 Nicotine dependence, cigarettes, uncomplicated: Secondary | ICD-10-CM | POA: Insufficient documentation

## 2015-08-08 DIAGNOSIS — Z7982 Long term (current) use of aspirin: Secondary | ICD-10-CM | POA: Diagnosis not present

## 2015-08-08 DIAGNOSIS — Z791 Long term (current) use of non-steroidal anti-inflammatories (NSAID): Secondary | ICD-10-CM | POA: Insufficient documentation

## 2015-08-08 DIAGNOSIS — Z880 Allergy status to penicillin: Secondary | ICD-10-CM | POA: Insufficient documentation

## 2015-08-08 MED ORDER — OXYCODONE-ACETAMINOPHEN 5-325 MG PO TABS: 1.0000 | ORAL_TABLET | Freq: Four times a day (QID) | ORAL | Status: DC | PRN

## 2015-08-08 MED ORDER — HYDROMORPHONE HCL 1 MG/ML IJ SOLN
1.0000 mg | Freq: Once | INTRAMUSCULAR | Status: AC
  Administered 2015-08-08: 1 mg via INTRAMUSCULAR
  Filled 2015-08-08: qty 1

## 2015-08-08 NOTE — ED Notes (Signed)
States she is scheduled for pain clinic in am at [facility] then possible surgeryto be scheudled in 2 weeks   States pain is from right hip into lateral foot

## 2015-08-08 NOTE — ED Provider Notes (Signed)
[facility] Emergency Department Provider Note ____________________________________________  Time seen: Approximately 3:49 PM  I have reviewed the triage vital signs and the nursing notes.   HISTORY  Chief Complaint Knee Pain    HPI Diane Castillo is a 45 y.o. female who presents to the emergency department for evaluation of chronic knee pain. She states that she has take all of the OTC medications she can take, but has not had relief. She has an appointment with the pain clinic at [facility] tomorrow at 2pm, but can not take the pain. She is here for temporary pain management.  Past Medical History  Diagnosis Date  . Asthma   . Hypertension     There are no active problems to display for this patient.   Past Surgical History  Procedure Laterality Date  . Knee surgery    . Hand surgery    . Abdominal hysterectomy      Current Outpatient Rx  Name  Route  Sig  Dispense  Refill  . aspirin 81 MG tablet   Oral   Take 81 mg by mouth daily.         . meloxicam (MOBIC) 15 MG tablet   Oral   Take 1 tablet (15 mg total) by mouth daily.   30 tablet   0   . oxyCODONE-acetaminophen (ROXICET) 5-325 MG tablet   Oral   Take 1 tablet by mouth every 6 (six) hours as needed.   9 tablet   0     Allergies Penicillins and Tramadol  No family history on file.  Social History Social History  Substance Use Topics  . Smoking status: Current Every Day Smoker -- 0.50 packs/day    Types: Cigarettes  . Smokeless tobacco: Current User  . Alcohol Use: Yes     Comment: socially    Review of Systems Constitutional: No recent illness or injuries. ENT: No sore throat. Cardiovascular: Denies chest pain or palpitations. Respiratory: Denies shortness of breath. Gastrointestinal: No abdominal pain.  Musculoskeletal: Pain in right knee. Skin: Negative for rash. Neurological: Negative for headaches, focal weakness or  numbness.   ____________________________________________   PHYSICAL EXAM:  VITAL SIGNS: ED Triage Vitals  Enc Vitals Group     BP 08/08/15 1517 116/70 mmHg     Pulse Rate 08/08/15 1517 63     Resp 08/08/15 1517 20     Temp 08/08/15 1517 98.2 F (36.8 C)     Temp Source 08/08/15 1517 Oral     SpO2 08/08/15 1517 100 %     Weight 08/08/15 1517 183 lb (83.008 kg)     Height 08/08/15 1517  (1.651 m)     Head Cir --      Peak Flow --      Pain Score 08/08/15 1518 10     Pain Loc --      Pain Edu? --      Excl. in GC? --     Constitutional: Alert and oriented. Well appearing and in no acute distress. Eyes: Conjunctivae are normal. EOMI. Head: Atraumatic. Neck: No stridor.  Respiratory: Normal respiratory effort.   Musculoskeletal: Right knee with active ROM. Tender on medial joint line to palpation. Joint is stable. Neurologic:  Normal speech and language. No gross focal neurologic deficits are appreciated. Speech is normal. Gait not tested due to pain. Skin:  Skin is warm, dry and intact. Atraumatic. Psychiatric: Mood and affect are normal. Speech and behavior are normal.  ____________________________________________  LABS (all labs ordered are listed, but only abnormal results are displayed)  Labs Reviewed - No data to display ____________________________________________  RADIOLOGY  Not indicated. ____________________________________________   PROCEDURES  Procedure(s) performed: None   ____________________________________________   INITIAL IMPRESSION / ASSESSMENT AND PLAN / ED COURSE  Pertinent labs & imaging results that were available during my care of the patient were reviewed by me and considered in my medical decision making (see chart for details).  Patient does have an appointment with Dr. Lavonia Drafts scheduled at [facility] for tomorrow. She was advised to keep that appointment as scheduled.   ____________________________________________   FINAL  CLINICAL IMPRESSION(S) / ED DIAGNOSES  Final diagnoses:  Chronic knee pain, right       Chinita Pester, FNP 08/09/15 1326  Arnaldo Natal, MD 08/18/15 2332

## 2015-08-08 NOTE — ED Notes (Signed)
Pt is awaiting surgery on right knee, pt is scheduled for surgery at [facility] in a few weeks, pt is here for pain control today

## 2015-08-08 NOTE — Discharge Instructions (Signed)

## 2015-09-13 ENCOUNTER — Encounter: Payer: Self-pay | Admitting: Emergency Medicine

## 2015-09-13 ENCOUNTER — Emergency Department
Admission: EM | Admit: 2015-09-13 | Discharge: 2015-09-13 | Disposition: A | Discharge status: Home / Self Care | Payer: Medicaid Other | Attending: Emergency Medicine | Admitting: Emergency Medicine

## 2015-09-13 DIAGNOSIS — Z96659 Presence of unspecified artificial knee joint: Secondary | ICD-10-CM | POA: Diagnosis not present

## 2015-09-13 DIAGNOSIS — I10 Essential (primary) hypertension: Secondary | ICD-10-CM | POA: Insufficient documentation

## 2015-09-13 DIAGNOSIS — G8929 Other chronic pain: Secondary | ICD-10-CM | POA: Insufficient documentation

## 2015-09-13 DIAGNOSIS — J45909 Unspecified asthma, uncomplicated: Secondary | ICD-10-CM | POA: Diagnosis not present

## 2015-09-13 DIAGNOSIS — Z7982 Long term (current) use of aspirin: Secondary | ICD-10-CM | POA: Insufficient documentation

## 2015-09-13 DIAGNOSIS — M25561 Pain in right knee: Secondary | ICD-10-CM | POA: Diagnosis not present

## 2015-09-13 DIAGNOSIS — F17210 Nicotine dependence, cigarettes, uncomplicated: Secondary | ICD-10-CM | POA: Diagnosis not present

## 2015-09-13 MED ORDER — MELOXICAM 15 MG PO TABS: 15.0000 mg | ORAL_TABLET | Freq: Every day | ORAL | Status: DC

## 2015-09-13 MED ORDER — KETOROLAC TROMETHAMINE 30 MG/ML IJ SOLN
30.0000 mg | Freq: Once | INTRAMUSCULAR | Status: AC
  Administered 2015-09-13: 30 mg via INTRAVENOUS
  Filled 2015-09-13: qty 1

## 2015-09-13 MED ORDER — OXYCODONE-ACETAMINOPHEN 5-325 MG PO TABS
2.0000 | ORAL_TABLET | Freq: Once | ORAL | Status: AC
  Administered 2015-09-13: 2 via ORAL
  Filled 2015-09-13: qty 2

## 2015-09-13 MED ORDER — OXYCODONE-ACETAMINOPHEN 5-325 MG PO TABS: 1.0000 | ORAL_TABLET | ORAL | Status: DC | PRN

## 2015-09-13 NOTE — Discharge Instructions (Signed)

## 2015-09-13 NOTE — ED Provider Notes (Signed)
[facility] Emergency Department Provider Note  ____________________________________________  Time seen: Approximately 1:46 PM  I have reviewed the triage vital signs and the nursing notes.   HISTORY  Chief Complaint Knee Pain    HPI Diane Castillo is a 45 y.o. female presents for evaluation of right knee pain. Patient states that she's had previous knee surgery and currently sees orthopedic in [facility]. Here for reevaluation of continued knee pain.   Past Medical History  Diagnosis Date  . Asthma   . Hypertension     There are no active problems to display for this patient.   Past Surgical History  Procedure Laterality Date  . Knee surgery    . Hand surgery    . Abdominal hysterectomy      Current Outpatient Rx  Name  Route  Sig  Dispense  Refill  . aspirin 81 MG tablet   Oral   Take 81 mg by mouth daily.         . meloxicam (MOBIC) 15 MG tablet   Oral   Take 1 tablet (15 mg total) by mouth daily.   30 tablet   0   . oxyCODONE-acetaminophen (ROXICET) 5-325 MG tablet   Oral   Take 1-2 tablets by mouth every 4 (four) hours as needed for severe pain.   15 tablet   0     Allergies Penicillins and Tramadol  No family history on file.  Social History Social History  Substance Use Topics  . Smoking status: Current Every Day Smoker -- 0.50 packs/day    Types: Cigarettes  . Smokeless tobacco: Current User  . Alcohol Use: Yes     Comment: socially    Review of Systems Constitutional: No fever/chills Musculoskeletal: Positive for right knee pain. Neurological: Negative for headaches, focal weakness or numbness.  10-point ROS otherwise negative.  ____________________________________________   PHYSICAL EXAM: BP 134/84 mmHg  Pulse 68  Temp(Src) 98.3 F (36.8 C) (Oral)  Resp 16  Ht  (1.651 m)  Wt 83.008 kg  BMI 30.45 kg/m2  SpO2 100%  LMP 11/23/2014  VITAL SIGNS: ED Triage Vitals  Enc Vitals Group   BP --      Pulse --      Resp --      Temp --      Temp src --      SpO2 --      Weight --      Height --      Head Cir --      Peak Flow --      Pain Score --      Pain Loc --      Pain Edu? --      Excl. in GC? --     Constitutional: Alert and oriented. Well appearing and in no acute distress. Eyes: Conjunctivae are normal. PERRL. EOMI. Head: Atraumatic. Nose: No congestion/rhinnorhea. Mouth/Throat: Mucous membranes are moist.  Oropharynx non-erythematous. Neck: No stridor.   Cardiovascular: Normal rate, regular rhythm. Grossly normal heart sounds.  Good peripheral circulation. Respiratory: Normal respiratory effort.  No retractions. Lungs CTAB. Gastrointestinal: Soft and nontender. No distention. No abdominal bruits. No CVA tenderness. Musculoskeletal: No lower extremity tenderness nor edema.  No joint effusions. Neurologic:  Normal speech and language. No gross focal neurologic deficits are appreciated. No gait instability. Skin:  Skin is warm, dry and intact. No rash noted. Psychiatric: Mood and affect are normal. Speech and behavior are normal.  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  Severe osteoarthritis of the right knee. Rx given for  Motrin 15 mg daily #30 Percocet 5/325. She is to continue her follow-up with her orthopedics as directed. Patient voices no other emergency medical complaints at this time ____________________________________________   FINAL CLINICAL IMPRESSION(S) / ED DIAGNOSES  Final diagnoses:  Knee pain, chronic, right     This chart was dictated using voice recognition software/Dragon. Despite best efforts to proofread, errors can occur which can change the meaning. Any change was purely unintentional.   Evangeline Dakinharles M Beers, PA-C 09/13/15 1411  Rockne MenghiniAnne-Caroline Norman, MD 09/13/15 1553

## 2015-09-13 NOTE — ED Notes (Signed)
States she hit her right knee on table sat  having increased apin

## 2015-09-29 ENCOUNTER — Emergency Department
Admission: EM | Admit: 2015-09-29 | Discharge: 2015-09-30 | Disposition: A | Discharge status: Home / Self Care | Payer: Medicaid Other | Attending: Emergency Medicine | Admitting: Emergency Medicine

## 2015-09-29 DIAGNOSIS — G8929 Other chronic pain: Secondary | ICD-10-CM

## 2015-09-29 DIAGNOSIS — M25561 Pain in right knee: Secondary | ICD-10-CM

## 2015-09-29 DIAGNOSIS — M1711 Unilateral primary osteoarthritis, right knee: Secondary | ICD-10-CM | POA: Insufficient documentation

## 2015-09-29 DIAGNOSIS — Z7982 Long term (current) use of aspirin: Secondary | ICD-10-CM | POA: Diagnosis not present

## 2015-09-29 DIAGNOSIS — J45909 Unspecified asthma, uncomplicated: Secondary | ICD-10-CM | POA: Insufficient documentation

## 2015-09-29 DIAGNOSIS — F17210 Nicotine dependence, cigarettes, uncomplicated: Secondary | ICD-10-CM | POA: Insufficient documentation

## 2015-09-29 DIAGNOSIS — I10 Essential (primary) hypertension: Secondary | ICD-10-CM | POA: Diagnosis not present

## 2015-09-29 MED ORDER — OXYCODONE-ACETAMINOPHEN 5-325 MG PO TABS
1.0000 | ORAL_TABLET | Freq: Once | ORAL | Status: AC
  Administered 2015-09-30: 1 via ORAL
  Filled 2015-09-29: qty 1

## 2015-09-29 NOTE — ED Notes (Signed)
Pt reports chronic right knee pain with increasing severity that began at approx 5pm today. Pt report she is supposed to have a total knee replacement, but has not yet scheduled the surgery. Pt currently rates pain at 10 out of 10 of the right knee.

## 2015-09-29 NOTE — Discharge Instructions (Signed)
Knee Pain Knee pain is a very common symptom and can have many causes. Knee pain often goes away when you follow your health care provider's instructions for relieving pain and discomfort at home. However, knee pain can develop into a condition that needs treatment. Some conditions may include:  Arthritis caused by wear and tear (osteoarthritis).  Arthritis caused by swelling and irritation (rheumatoid arthritis or gout).  A cyst or growth in your knee.  An infection in your knee joint.  An injury that will not heal.  Damage, swelling, or irritation of the tissues that support your knee (torn ligaments or tendinitis). If your knee pain continues, additional tests may be ordered to diagnose your condition. Tests may include X-rays or other imaging studies of your knee. You may also need to have fluid removed from your knee. Treatment for ongoing knee pain depends on the cause, but treatment may include:  Medicines to relieve pain or swelling.  Steroid injections in your knee.  Physical therapy.  Surgery. HOME CARE INSTRUCTIONS  Take medicines only as directed by your health care provider.  Rest your knee and keep it raised (elevated) while you are resting.  Do not do things that cause or worsen pain.  Avoid high-impact activities or exercises, such as running, jumping rope, or doing jumping jacks.  Apply ice to the knee area:  Put ice in a plastic bag.  Place a towel between your skin and the bag.  Leave the ice on for 20 minutes, 2-3 times a day.  Ask your health care provider if you should wear an elastic knee support.  Keep a pillow under your knee when you sleep.  Lose weight if you are overweight. Extra weight can put pressure on your knee.  Do not use any tobacco products, including cigarettes, chewing tobacco, or electronic cigarettes. If you need help quitting, ask your health care provider. Smoking may slow the healing of any bone and joint problems that you may  have. SEEK MEDICAL CARE IF:  Your knee pain continues, changes, or gets worse.  You have a fever along with knee pain.  Your knee buckles or locks up.  Your knee becomes more swollen. SEEK IMMEDIATE MEDICAL CARE IF:   Your knee joint feels hot to the touch.  You have chest pain or trouble breathing.   This information is not intended to replace advice given to you by your health care provider. Make sure you discuss any questions you have with your health care provider.   Document Released: 04/01/2007 Document Revised: 06/25/2014 Document Reviewed: 01/18/2014 Elsevier Interactive Patient Education 2016 Elsevier Inc.  Chronic Pain Chronic pain can be defined as pain that is off and on and lasts for 3-6 months or longer. Many things cause chronic pain, which can make it difficult to make a diagnosis. There are many treatment options available for chronic pain. However, finding a treatment that works well for you may require trying various approaches until the right one is found. Many people benefit from a combination of two or more types of treatment to control their pain. SYMPTOMS  Chronic pain can occur anywhere in the body and can range from mild to very severe. Some types of chronic pain include:  Headache.  Low back pain.  Cancer pain.  Arthritis pain.  Neurogenic pain. This is pain resulting from damage to nerves. People with chronic pain may also have other symptoms such as:  Depression.  Anger.  Insomnia.  Anxiety. DIAGNOSIS  Your health care  provider will help diagnose your condition over time. In many cases, the initial focus will be on excluding possible conditions that could be causing the pain. Depending on your symptoms, your health care provider may order tests to diagnose your condition. Some of these tests may include:   Blood tests.   CT scan.   MRI.   X-rays.   Ultrasounds.   Nerve conduction studies.  You may need to see a specialist.   TREATMENT  Finding treatment that works well may take time. You may be referred to a pain specialist. He or she may prescribe medicine or therapies, such as:   Mindful meditation or yoga.  Shots (injections) of numbing or pain-relieving medicines into the spine or area of pain.  Local electrical stimulation.  Acupuncture.   Massage therapy.   Aroma, color, light, or sound therapy.   Biofeedback.   Working with a physical therapist to keep from getting stiff.   Regular, gentle exercise.   Cognitive or behavioral therapy.   Group support.  Sometimes, surgery may be recommended.  HOME CARE INSTRUCTIONS   Take all medicines as directed by your health care provider.   Lessen stress in your life by relaxing and doing things such as listening to calming music.   Exercise or be active as directed by your health care provider.   Eat a healthy diet and include things such as vegetables, fruits, fish, and lean meats in your diet.   Keep all follow-up appointments with your health care provider.   Attend a support group with others suffering from chronic pain. SEEK MEDICAL CARE IF:   Your pain gets worse.   You develop a new pain that was not there before.   You cannot tolerate medicines given to you by your health care provider.   You have new symptoms since your last visit with your health care provider.  SEEK IMMEDIATE MEDICAL CARE IF:   You feel weak.   You have decreased sensation or numbness.   You lose control of bowel or bladder function.   Your pain suddenly gets much worse.   You develop shaking.  You develop chills.  You develop confusion.  You develop chest pain.  You develop shortness of breath.  MAKE SURE YOU:  Understand these instructions.  Will watch your condition.  Will get help right away if you are not doing well or get worse.   This information is not intended to replace advice given to you by your health care  provider. Make sure you discuss any questions you have with your health care provider.   Document Released: 02/24/2002 Document Revised: 02/04/2013 Document Reviewed: 11/28/2012 Elsevier Interactive Patient Education [employer]2016 Elsevier Inc.   Your exam is normal today. You should follow-up with your ortho provider as discussed. Take the Mobic as directed. See your provider for continued, chronic pain management.

## 2015-09-29 NOTE — ED Notes (Signed)
Pt to triage via w/c with no distress noted; pt reports right knee pain; [facility] and told needs knee replacement but pain unrelieved by excedrin taken PTA

## 2015-09-29 NOTE — ED Provider Notes (Signed)
CSN: [id number]     Arrival date & time 09/29/15  2218 History   First MD Initiated Contact with Patient 09/29/15 2323     Chief Complaint  Patient presents with  . Knee Pain   HPI   45 year old female presents to the ED for evaluation of ongoing chronic right knee pain. She gives a history of pain that began about 5 PM today while at work. She denies any injury, trauma, fall, or accident. She gives a history of chronic knee pain due to underlying osteoarthritis. She has been referred to orthopedics for a total knee replacement in the recent past. She presents today noting pain above baseline, and requesting immediate referral for surgical consultation. She rates her pain at a 10/10 in triage.  Past Medical History  Diagnosis Date  . Asthma   . Hypertension    Past Surgical History  Procedure Laterality Date  . Knee surgery    . Hand surgery    . Abdominal hysterectomy     No family history on file. Social History  Substance Use Topics  . Smoking status: Current Every Day Smoker -- 0.50 packs/day    Types: Cigarettes  . Smokeless tobacco: Current User  . Alcohol Use: Yes     Comment: socially   OB History    No data available     Review of Systems  Constitutional: Negative.   Musculoskeletal: Positive for myalgias, joint swelling and arthralgias.  Skin: Negative.   Neurological: Negative.     Allergies  Penicillins and Tramadol  Home Medications   Prior to Admission medications   Medication Sig Start Date End Date Taking? Authorizing Provider  aspirin 81 MG tablet Take 81 mg by mouth daily.    Historical Provider, MD  meloxicam (MOBIC) 15 MG tablet Take 1 tablet (15 mg total) by mouth daily. 09/13/15   Evangeline Dakin, PA-C  oxyCODONE-acetaminophen (ROXICET) 5-325 MG tablet Take 1-2 tablets by mouth every 4 (four) hours as needed for severe pain. 09/13/15   Charmayne Sheer Beers, PA-C   BP 148/102 mmHg  Pulse 55  Temp(Src) 98.1 F (36.7 C) (Oral)  Resp 18  Ht   (1.651 m)  Wt 83.008 kg  BMI 30.45 kg/m2  SpO2 98%  LMP 11/23/2014 Physical Exam  Constitutional: She is oriented to person, place, and time. She appears well-developed and well-nourished.  HENT:  Head: Normocephalic and atraumatic.  Eyes: EOM are normal. Pupils are equal, round, and reactive to light.  Cardiovascular: Regular rhythm.   Pulmonary/Chest: Effort normal.  Musculoskeletal: She exhibits no edema or tenderness.  Patient right knee with prior surgical scars noted anteriorly. She is with the knee exam without obvious deformity, effusion, or edema. She is able to demonstrate normal knee flexion without difficulty. She has patella tracks without ballottement. No valgus or varus joint stress is appreciated. She is minimally tender to palpation to the anterior patellar tendon and posterior popliteal space. No calf or Achilles tenderness is appreciated.  Neurological: She is alert and oriented to person, place, and time.  Skin: Skin is warm and dry.  Nursing note and vitals reviewed.   ED Course  Procedures (including critical care time)  Percocet 5-325 mg PO Ace bandage  Labs Review Labs Reviewed - No data to display  Imaging Review No results found.    EKG Interpretation None      MDM   Final diagnoses:  Knee pain, chronic, right  Primary osteoarthritis of right knee    Patient  with an acute flare of her chronic right knee pain due to underlying DJD. She is advised to continue dosed. The prescribed meloxicam as previously prescribed. She is also advised to contact her [facility]opedic provider [facility] tomorrow for surgical consultation. The patient will not be provided with narcotic prescription at this visit in accordance with our chronic pain policy. She is discharged with an Ace wrap to the knee for support.    Charlesetta IvoryJenise V Bacon Fort RansomMenshew, PA-C 09/29/15 2357  Jennye MoccasinBrian S Quigley, MD 09/30/15 [phone]

## 2015-09-30 NOTE — ED Notes (Signed)
Reviewed d/c instructions, follow-up care and use of ice/elevation with pt. Pt verbalized understanding.

## 2015-09-30 NOTE — ED Notes (Signed)
Applied ace bandage to right knee

## 2015-11-08 ENCOUNTER — Emergency Department
Admission: EM | Admit: 2015-11-08 | Discharge: 2015-11-08 | Disposition: A | Discharge status: Home / Self Care | Payer: Medicaid Other | Attending: Emergency Medicine | Admitting: Emergency Medicine

## 2015-11-08 DIAGNOSIS — I10 Essential (primary) hypertension: Secondary | ICD-10-CM | POA: Insufficient documentation

## 2015-11-08 DIAGNOSIS — M25561 Pain in right knee: Secondary | ICD-10-CM | POA: Diagnosis present

## 2015-11-08 DIAGNOSIS — Z7982 Long term (current) use of aspirin: Secondary | ICD-10-CM | POA: Diagnosis not present

## 2015-11-08 DIAGNOSIS — F17210 Nicotine dependence, cigarettes, uncomplicated: Secondary | ICD-10-CM | POA: Insufficient documentation

## 2015-11-08 DIAGNOSIS — J45909 Unspecified asthma, uncomplicated: Secondary | ICD-10-CM | POA: Diagnosis not present

## 2015-11-08 MED ORDER — OXYCODONE-ACETAMINOPHEN 5-325 MG PO TABS: 1.0000 | ORAL_TABLET | ORAL | Status: DC | PRN

## 2015-11-08 MED ORDER — NAPROXEN 500 MG PO TABS: 500.0000 mg | ORAL_TABLET | Freq: Two times a day (BID) | ORAL | Status: DC

## 2015-11-08 MED ORDER — KETOROLAC TROMETHAMINE 60 MG/2ML IM SOLN
60.0000 mg | Freq: Once | INTRAMUSCULAR | Status: AC
  Administered 2015-11-08: 60 mg via INTRAMUSCULAR
  Filled 2015-11-08: qty 2

## 2015-11-08 NOTE — ED Provider Notes (Signed)
[facility] Emergency Department Provider Note  ____________________________________________  Time seen: Approximately 10:30 AM  I have reviewed the triage vital signs and the nursing notes.   HISTORY  Chief Complaint Knee Pain    HPI Diane Castillo is a 45 y.o. female with a recurrent history an ongoing osteoarthritis of the right knee. Patient is being followed by pain management in [facility] and awaiting orthopedic referral for surgery. Patient presents today for increased pain. Patient denies any new or recent trauma.   Past Medical History  Diagnosis Date  . Asthma   . Hypertension     There are no active problems to display for this patient.   Past Surgical History  Procedure Laterality Date  . Knee surgery    . Hand surgery    . Abdominal hysterectomy      Current Outpatient Rx  Name  Route  Sig  Dispense  Refill  . aspirin 81 MG tablet   Oral   Take 81 mg by mouth daily.         . naproxen (NAPROSYN) 500 MG tablet   Oral   Take 1 tablet (500 mg total) by mouth 2 (two) times daily with a meal.   60 tablet   0   . oxyCODONE-acetaminophen (ROXICET) 5-325 MG tablet   Oral   Take 1-2 tablets by mouth every 4 (four) hours as needed for severe pain.   15 tablet   0     Allergies Penicillins and Tramadol  No family history on file.  Social History Social History  Substance Use Topics  . Smoking status: Current Every Day Smoker -- 0.50 packs/day    Types: Cigarettes  . Smokeless tobacco: Current User  . Alcohol Use: Yes     Comment: socially    Review of Systems Constitutional: No fever/chills Eyes: No visual changes. ENT: No sore throat. Cardiovascular: Denies chest pain. Respiratory: Denies shortness of breath. Gastrointestinal: No abdominal pain.  No nausea, no vomiting.  No diarrhea.  No constipation. Genitourinary: Negative for dysuria. Musculoskeletal:Positive for right knee pain. Skin: Negative for  rash. Neurological: Negative for headaches, focal weakness or numbness.  10-point ROS otherwise negative.  ____________________________________________   PHYSICAL EXAM:  VITAL SIGNS: ED Triage Vitals  Enc Vitals Group     BP 11/08/15 1029 139/83 mmHg     Pulse Rate 11/08/15 1029 62     Resp 11/08/15 1029 17     Temp 11/08/15 1029 98 F (36.7 C)     Temp Source 11/08/15 1029 Oral     SpO2 11/08/15 1029 99 %     Weight 11/08/15 1029 173 lb (78.472 kg)     Height 11/08/15 1029  (1.651 m)     Head Cir --      Peak Flow --      Pain Score 11/08/15 1030 10     Pain Loc --      Pain Edu? --      Excl. in GC? --     Constitutional: Alert and oriented. Well appearing and in no acute distress.   Cardiovascular: Normal rate, regular rhythm. Grossly normal heart sounds.  Good peripheral circulation. Respiratory: Normal respiratory effort.  No retractions. Lungs CTAB. Musculoskeletal: Knee with limited range of motion increased pain with extension and flexion. Distally neurovascularly intact. No evidence of effusion at this time. Neurologic:  Normal speech and language. No gross focal neurologic deficits are appreciated. No gait instability. Skin:  Skin is  warm, dry and intact. No rash noted. Psychiatric: Mood and affect are normal. Speech and behavior are normal.  ____________________________________________   LABS (all labs ordered are listed, but only abnormal results are displayed)  Labs Reviewed - No data to display   RADIOLOGY  Deferred patient has multiple previous x-rays on file. ____________________________________________   PROCEDURES  Procedure(s) performed: None  Critical Care performed: No  ____________________________________________   INITIAL IMPRESSION / ASSESSMENT AND PLAN / ED COURSE  Pertinent labs & imaging results that were available during my care of the patient were reviewed by me and considered in my medical decision making (see chart for  details).  Recurrent osteoarthritis of the right knee. Toradol 60 mg IM given while in the ED Rx patient discharged home with Percocet 5/325 and Naprosyn 500 mg twice a day. Patient follow-up with pain management as soon as possible or as scheduled. ____________________________________________   FINAL CLINICAL IMPRESSION(S) / ED DIAGNOSES  Final diagnoses:  Knee pain, acute, right     This chart was dictated using voice recognition software/Dragon. Despite best efforts to proofread, errors can occur which can change the meaning. Any change was purely unintentional.   Evangeline Dakinharles M Florette Thai, PA-C 11/08/15 1107  Jene Everyobert Kinner, MD 11/08/15 [phone]

## 2015-11-08 NOTE — Discharge Instructions (Signed)
Heat Therapy °Heat therapy can help ease sore, stiff, injured, and tight muscles and joints. Heat relaxes your muscles, which may help ease your pain.  °RISKS AND COMPLICATIONS °If you have any of the following conditions, do not use heat therapy unless your health care provider has approved: °· Poor circulation. °· Healing wounds or scarred skin in the area being treated. °· Diabetes, heart disease, or high blood pressure. °· Not being able to feel (numbness) the area being treated. °· Unusual swelling of the area being treated. °· Active infections. °· Blood clots. °· Cancer. °· Inability to communicate pain. This may include young children and people who have problems with their brain function (dementia). °· Pregnancy. °Heat therapy should only be used on old, pre-existing, or long-lasting (chronic) injuries. Do not use heat therapy on new injuries unless directed by your health care provider. °HOW TO USE HEAT THERAPY °There are several different kinds of heat therapy, including: °· Moist heat pack. °· Warm water bath. °· Hot water bottle. °· Electric heating pad. °· Heated gel pack. °· Heated wrap. °· Electric heating pad. °Use the heat therapy method suggested by your health care provider. Follow your health care provider's instructions on when and how to use heat therapy. °GENERAL HEAT THERAPY RECOMMENDATIONS °· Do not sleep while using heat therapy. Only use heat therapy while you are awake. °· Your skin may turn pink while using heat therapy. Do not use heat therapy if your skin turns red. °· Do not use heat therapy if you have new pain. °· High heat or long exposure to heat can cause burns. Be careful when using heat therapy to avoid burning your skin. °· Do not use heat therapy on areas of your skin that are already irritated, such as with a rash or sunburn. °SEEK MEDICAL CARE IF: °· You have blisters, redness, swelling, or numbness. °· You have new pain. °· Your pain is worse. °MAKE SURE  YOU: °· Understand these instructions. °· Will watch your condition. °· Will get help right away if you are not doing well or get worse. °  °This information is not intended to replace advice given to you by your health care provider. Make sure you discuss any questions you have with your health care provider. °  °Document Released: 08/27/2011 Document Revised: 06/25/2014 Document Reviewed: 07/28/2013 °Elsevier Interactive Patient Education ©2016 [employer] ° °Joint Pain °Joint pain, which is also called arthralgia, can be caused by many things. Joint pain often goes away when you follow your health care provider's instructions for relieving pain at home. However, joint pain can also be caused by conditions that require further treatment. Common causes of joint pain include: °· Bruising in the area of the joint. °· Overuse of the joint. °· Wear and tear on the joints that occur with aging (osteoarthritis). °· Various other forms of arthritis. °· A buildup of a crystal form of uric acid in the joint (gout). °· Infections of the joint (septic arthritis) or of the bone (osteomyelitis). °Your health care provider may recommend medicine to help with the pain. If your joint pain continues, additional tests may be needed to diagnose your condition. °HOME CARE INSTRUCTIONS °Watch your condition for any changes. Follow these instructions as directed to lessen the pain that you are feeling. °· Take medicines only as directed by your health care provider. °· Rest the affected area for as long as your health care provider says that you should. If directed to do so, raise   the painful joint above the level of your heart while you are sitting or lying down. °· Do not do things that cause or worsen pain. °· If directed, apply ice to the painful area: °¨ Put ice in a plastic bag. °¨ Place a towel between your skin and the bag. °¨ Leave the ice on for 20 minutes, 2-3 times per day. °· Wear an elastic bandage, splint, or sling as  directed by your health care provider. Loosen the elastic bandage or splint if your fingers or toes become numb and tingle, or if they turn cold and blue. °· Begin exercising or stretching the affected area as directed by your health care provider. Ask your health care provider what types of exercise are safe for you. °· Keep all follow-up visits as directed by your health care provider. This is important. °SEEK MEDICAL CARE IF: °· Your pain increases, and medicine does not help. °· Your joint pain does not improve within 3 days. °· You have increased bruising or swelling. °· You have a fever. °· You lose 10 lb (4.5 kg) or more without trying. °SEEK IMMEDIATE MEDICAL CARE IF: °· You are not able to move the joint. °· Your fingers or toes become numb or they turn cold and blue. °  °This information is not intended to replace advice given to you by your health care provider. Make sure you discuss any questions you have with your health care provider. °  °Document Released: 06/04/2005 Document Revised: 06/25/2014 Document Reviewed: 03/16/2014 °Elsevier Interactive Patient Education ©2016 [employer] ° °

## 2015-11-08 NOTE — ED Notes (Signed)
States she has chronic knee apin   Waiting for knee replacement and her first appt  To pain clinic  States pain is now from right lower back into leg  Ambulates with slight limp d/t pain

## 2015-11-08 NOTE — ED Notes (Signed)
Pt c/o chronic knee pain, states today it is worse in the right knee.

## 2015-11-13 ENCOUNTER — Emergency Department: Payer: Medicaid Other

## 2015-11-13 ENCOUNTER — Observation Stay
Admission: EM | Admit: 2015-11-13 | Discharge: 2015-11-14 | Disposition: A | Discharge status: Home / Self Care | Payer: Medicaid Other | Attending: Internal Medicine | Admitting: Internal Medicine

## 2015-11-13 DIAGNOSIS — J45909 Unspecified asthma, uncomplicated: Secondary | ICD-10-CM | POA: Diagnosis not present

## 2015-11-13 DIAGNOSIS — S86119A Strain of other muscle(s) and tendon(s) of posterior muscle group at lower leg level, unspecified leg, initial encounter: Secondary | ICD-10-CM

## 2015-11-13 DIAGNOSIS — Z885 Allergy status to narcotic agent status: Secondary | ICD-10-CM | POA: Insufficient documentation

## 2015-11-13 DIAGNOSIS — F1410 Cocaine abuse, uncomplicated: Secondary | ICD-10-CM | POA: Insufficient documentation

## 2015-11-13 DIAGNOSIS — M79604 Pain in right leg: Secondary | ICD-10-CM | POA: Diagnosis present

## 2015-11-13 DIAGNOSIS — I10 Essential (primary) hypertension: Secondary | ICD-10-CM | POA: Diagnosis not present

## 2015-11-13 DIAGNOSIS — M1711 Unilateral primary osteoarthritis, right knee: Secondary | ICD-10-CM | POA: Insufficient documentation

## 2015-11-13 DIAGNOSIS — M6282 Rhabdomyolysis: Secondary | ICD-10-CM

## 2015-11-13 DIAGNOSIS — F17210 Nicotine dependence, cigarettes, uncomplicated: Secondary | ICD-10-CM | POA: Insufficient documentation

## 2015-11-13 DIAGNOSIS — Z8249 Family history of ischemic heart disease and other diseases of the circulatory system: Secondary | ICD-10-CM | POA: Insufficient documentation

## 2015-11-13 DIAGNOSIS — B20 Human immunodeficiency virus [HIV] disease: Secondary | ICD-10-CM | POA: Diagnosis not present

## 2015-11-13 DIAGNOSIS — R739 Hyperglycemia, unspecified: Secondary | ICD-10-CM | POA: Diagnosis not present

## 2015-11-13 DIAGNOSIS — M79661 Pain in right lower leg: Principal | ICD-10-CM

## 2015-11-13 DIAGNOSIS — W19XXXA Unspecified fall, initial encounter: Secondary | ICD-10-CM | POA: Diagnosis not present

## 2015-11-13 DIAGNOSIS — E872 Acidosis, unspecified: Secondary | ICD-10-CM

## 2015-11-13 DIAGNOSIS — Z7982 Long term (current) use of aspirin: Secondary | ICD-10-CM | POA: Insufficient documentation

## 2015-11-13 DIAGNOSIS — E876 Hypokalemia: Secondary | ICD-10-CM | POA: Diagnosis not present

## 2015-11-13 DIAGNOSIS — Z86718 Personal history of other venous thrombosis and embolism: Secondary | ICD-10-CM | POA: Insufficient documentation

## 2015-11-13 DIAGNOSIS — S86111A Strain of other muscle(s) and tendon(s) of posterior muscle group at lower leg level, right leg, initial encounter: Secondary | ICD-10-CM | POA: Insufficient documentation

## 2015-11-13 DIAGNOSIS — Z79899 Other long term (current) drug therapy: Secondary | ICD-10-CM | POA: Diagnosis not present

## 2015-11-13 DIAGNOSIS — F1110 Opioid abuse, uncomplicated: Secondary | ICD-10-CM | POA: Insufficient documentation

## 2015-11-13 DIAGNOSIS — F1210 Cannabis abuse, uncomplicated: Secondary | ICD-10-CM | POA: Diagnosis not present

## 2015-11-13 DIAGNOSIS — Z90710 Acquired absence of both cervix and uterus: Secondary | ICD-10-CM | POA: Diagnosis not present

## 2015-11-13 DIAGNOSIS — Z880 Allergy status to penicillin: Secondary | ICD-10-CM | POA: Diagnosis not present

## 2015-11-13 DIAGNOSIS — Z9889 Other specified postprocedural states: Secondary | ICD-10-CM | POA: Diagnosis not present

## 2015-11-13 DIAGNOSIS — F1910 Other psychoactive substance abuse, uncomplicated: Secondary | ICD-10-CM

## 2015-11-13 LAB — BASIC METABOLIC PANEL
Anion gap: 10 (ref 5–15)
BUN: 13 mg/dL (ref 6–20)
CALCIUM: 9 mg/dL (ref 8.9–10.3)
CO2: 20 mmol/L — AB (ref 22–32)
CREATININE: 0.7 mg/dL (ref 0.44–1.00)
Chloride: 106 mmol/L (ref 101–111)
GLUCOSE: 161 mg/dL — AB (ref 65–99)
Potassium: 3.4 mmol/L — ABNORMAL LOW (ref 3.5–5.1)
Sodium: 136 mmol/L (ref 135–145)

## 2015-11-13 LAB — CBC WITH DIFFERENTIAL/PLATELET
BASOS PCT: 0 %
Basophils Absolute: 0 10*3/uL (ref 0–0.1)
EOS ABS: 0.1 10*3/uL (ref 0–0.7)
Eosinophils Relative: 1 %
HCT: 42.8 % (ref 35.0–47.0)
Hemoglobin: 14.4 g/dL (ref 12.0–16.0)
Lymphocytes Relative: 21 %
Lymphs Abs: 1.5 10*3/uL (ref 1.0–3.6)
MCH: 31.8 pg (ref 26.0–34.0)
MCHC: 33.6 g/dL (ref 32.0–36.0)
MCV: 94.6 fL (ref 80.0–100.0)
MONO ABS: 0.4 10*3/uL (ref 0.2–0.9)
MONOS PCT: 6 %
NEUTROS PCT: 72 %
Neutro Abs: 4.9 10*3/uL (ref 1.4–6.5)
Platelets: 190 10*3/uL (ref 150–440)
RBC: 4.53 MIL/uL (ref 3.80–5.20)
RDW: 13.4 % (ref 11.5–14.5)
WBC: 6.9 10*3/uL (ref 3.6–11.0)

## 2015-11-13 LAB — URINE DRUG SCREEN, QUALITATIVE (ARMC ONLY)
Amphetamines, Ur Screen: NOT DETECTED
BARBITURATES, UR SCREEN: NOT DETECTED
BENZODIAZEPINE, UR SCRN: NOT DETECTED
CANNABINOID 50 NG, UR ~~LOC~~: POSITIVE — AB
Cocaine Metabolite,Ur ~~LOC~~: POSITIVE — AB
MDMA (Ecstasy)Ur Screen: NOT DETECTED
Methadone Scn, Ur: NOT DETECTED
Opiate, Ur Screen: POSITIVE — AB
PHENCYCLIDINE (PCP) UR S: NOT DETECTED
Tricyclic, Ur Screen: NOT DETECTED

## 2015-11-13 LAB — HEMOGLOBIN A1C: Hgb A1c MFr Bld: 5 % (ref 4.0–6.0)

## 2015-11-13 LAB — TSH: TSH: 0.695 u[IU]/mL (ref 0.350–4.500)

## 2015-11-13 LAB — CK: CK TOTAL: 1631 U/L — AB (ref 38–234)

## 2015-11-13 LAB — TROPONIN I

## 2015-11-13 MED ORDER — DOCUSATE SODIUM 100 MG PO CAPS
100.0000 mg | ORAL_CAPSULE | Freq: Two times a day (BID) | ORAL | Status: DC
  Administered 2015-11-13 – 2015-11-14 (×2): 100 mg via ORAL
  Filled 2015-11-13 (×2): qty 1

## 2015-11-13 MED ORDER — ONDANSETRON HCL 4 MG/2ML IJ SOLN
4.0000 mg | Freq: Once | INTRAMUSCULAR | Status: AC
  Administered 2015-11-13: 4 mg via INTRAVENOUS
  Filled 2015-11-13: qty 2

## 2015-11-13 MED ORDER — ACETAMINOPHEN 650 MG RE SUPP: 650.0000 mg | Freq: Four times a day (QID) | RECTAL | Status: DC | PRN

## 2015-11-13 MED ORDER — VALACYCLOVIR HCL 500 MG PO TABS
500.0000 mg | ORAL_TABLET | Freq: Two times a day (BID) | ORAL | Status: DC
  Administered 2015-11-13 – 2015-11-14 (×2): 500 mg via ORAL
  Filled 2015-11-13 (×2): qty 1

## 2015-11-13 MED ORDER — ACETAMINOPHEN 325 MG PO TABS: 650.0000 mg | ORAL_TABLET | Freq: Four times a day (QID) | ORAL | Status: DC | PRN

## 2015-11-13 MED ORDER — EMTRICITABINE-TENOFOVIR AF 200-25 MG PO TABS
1.0000 | ORAL_TABLET | Freq: Every day | ORAL | Status: DC
  Administered 2015-11-13 – 2015-11-14 (×2): 1 via ORAL
  Filled 2015-11-13 (×2): qty 1

## 2015-11-13 MED ORDER — ENOXAPARIN SODIUM 40 MG/0.4ML ~~LOC~~ SOLN
40.0000 mg | Freq: Every day | SUBCUTANEOUS | Status: DC
  Administered 2015-11-13: 40 mg via SUBCUTANEOUS
  Filled 2015-11-13: qty 0.4

## 2015-11-13 MED ORDER — MORPHINE SULFATE (PF) 4 MG/ML IV SOLN
4.0000 mg | Freq: Once | INTRAVENOUS | Status: AC
  Administered 2015-11-13: 4 mg via INTRAVENOUS
  Filled 2015-11-13: qty 1

## 2015-11-13 MED ORDER — HYDROCODONE-ACETAMINOPHEN 5-325 MG PO TABS: 2.0000 | ORAL_TABLET | Freq: Four times a day (QID) | ORAL | Status: DC | PRN

## 2015-11-13 MED ORDER — ONDANSETRON HCL 4 MG/2ML IJ SOLN
4.0000 mg | Freq: Four times a day (QID) | INTRAMUSCULAR | Status: DC | PRN
  Administered 2015-11-13: 4 mg via INTRAVENOUS
  Filled 2015-11-13: qty 2

## 2015-11-13 MED ORDER — POTASSIUM CHLORIDE IN NACL 20-0.9 MEQ/L-% IV SOLN
INTRAVENOUS | Status: DC
  Administered 2015-11-13: 23:00:00 via INTRAVENOUS
  Filled 2015-11-13 (×5): qty 1000

## 2015-11-13 MED ORDER — HYDROMORPHONE HCL 1 MG/ML IJ SOLN
0.5000 mg | Freq: Once | INTRAMUSCULAR | Status: AC
  Administered 2015-11-13: 17:00:00 via INTRAVENOUS
  Filled 2015-11-13: qty 1

## 2015-11-13 MED ORDER — SODIUM CHLORIDE 0.9 % IV BOLUS (SEPSIS)
1000.0000 mL | Freq: Once | INTRAVENOUS | Status: AC
  Administered 2015-11-13: 1000 mL via INTRAVENOUS

## 2015-11-13 MED ORDER — LIDOCAINE HCL (PF) 1 % IJ SOLN
INTRAMUSCULAR | Status: AC
  Filled 2015-11-13: qty 5

## 2015-11-13 MED ORDER — MORPHINE SULFATE (PF) 2 MG/ML IV SOLN
2.0000 mg | INTRAVENOUS | Status: DC | PRN
  Administered 2015-11-13 – 2015-11-14 (×4): 2 mg via INTRAVENOUS
  Filled 2015-11-13 (×4): qty 1

## 2015-11-13 MED ORDER — ALBUTEROL SULFATE (2.5 MG/3ML) 0.083% IN NEBU: 3.0000 mL | INHALATION_SOLUTION | RESPIRATORY_TRACT | Status: DC | PRN

## 2015-11-13 MED ORDER — ASPIRIN EC 81 MG PO TBEC
81.0000 mg | DELAYED_RELEASE_TABLET | Freq: Every day | ORAL | Status: DC
  Administered 2015-11-13 – 2015-11-14 (×2): 81 mg via ORAL
  Filled 2015-11-13 (×2): qty 1

## 2015-11-13 MED ORDER — ONDANSETRON HCL 4 MG PO TABS: 4.0000 mg | ORAL_TABLET | Freq: Four times a day (QID) | ORAL | Status: DC | PRN

## 2015-11-13 MED ORDER — DOLUTEGRAVIR SODIUM 50 MG PO TABS
50.0000 mg | ORAL_TABLET | Freq: Every day | ORAL | Status: DC
  Administered 2015-11-13 – 2015-11-14 (×2): 50 mg via ORAL
  Filled 2015-11-13 (×3): qty 1

## 2015-11-13 NOTE — ED Notes (Signed)
Pt up to BR with one person assist.

## 2015-11-13 NOTE — H&P (Signed)
Diane Castillo is an 45 y.o. female.   Chief Complaint: Leg pain HPI: The patient with past medical history significant for HIV and remote DVT of the right lower extremity (per patient) presents emergency department complaining of right lower extremity pain. She states that she felt pain in her right calf the night prior to admission. She tried to stand from a seated position at that time and fell over due to weakness and pain in the leg. This morning the patient awoke and was unable to walk due to the pain. She states this is happened before when she was diagnosed with a DVT found on MRI. In the emergency department the patient was seen by orthopedics due to concern for compartment syndrome. Ultrasound of the lower extremity did not reveal clot. Laboratory evaluation showed elevated creatinine kinase which prompted the emergency department staff to call the hospitalist service for admission.  Past Medical History  Diagnosis Date  . Asthma   . Hypertension   . HIV disease [facility])     Past Surgical History  Procedure Laterality Date  . Knee surgery    . Hand surgery    . Abdominal hysterectomy    . Hernia repair      Family History  Problem Relation Age of Onset  . Heart disease Mother    Social History:  reports that she has been smoking Cigarettes.  She has been smoking about 0.50 packs per day. She uses smokeless tobacco. She reports that she drinks alcohol. She reports that she uses illicit drugs (Marijuana).  Allergies:  Allergies  Allergen Reactions  . Penicillins Hives and Swelling    Has patient had a PCN reaction causing immediate rash, facial/tongue/throat swelling, SOB or lightheadedness with hypotension: Yes Has patient had a PCN reaction causing severe rash involving mucus membranes or skin necrosis: No Has patient had a PCN reaction that required hospitalization No Has patient had a PCN reaction occurring within the last 10 years: No If all of the above answers are "NO",  then may proceed with Cephalosporin use.  . Tramadol Rash    Medications Prior to Admission  Medication Sig Dispense Refill  . albuterol (PROVENTIL HFA) 108 (90 Base) MCG/ACT inhaler Inhale 2 puffs into the lungs every 6 (six) hours as needed. For nausea and/or vomiting.    Marland Kitchen aspirin EC 81 MG tablet Take 81 mg by mouth daily.     . dolutegravir (TIVICAY) 50 MG tablet Take 50 mg by mouth daily.    Marland Kitchen emtricitabine-tenofovir (TRUVADA) 200-300 MG tablet Take 1 tablet by mouth daily.    . valACYclovir (VALTREX) 500 MG tablet Take 500 mg by mouth 2 (two) times daily.    . naproxen (NAPROSYN) 500 MG tablet Take 1 tablet (500 mg total) by mouth 2 (two) times daily with a meal. 60 tablet 0  . oxyCODONE-acetaminophen (ROXICET) 5-325 MG tablet Take 1-2 tablets by mouth every 4 (four) hours as needed for severe pain. 15 tablet 0    Results for orders placed or performed during the hospital encounter of 11/13/15 (from the past 48 hour(s))  Basic metabolic panel     Status: Abnormal   Collection Time: 11/13/15 11:50 AM  Result Value Ref Range   Sodium 136 135 - 145 mmol/L   Potassium 3.4 (L) 3.5 - 5.1 mmol/L   Chloride 106 101 - 111 mmol/L   CO2 20 (L) 22 - 32 mmol/L   Glucose, Bld 161 (H) 65 - 99 mg/dL   BUN 13  6 - 20 mg/dL   Creatinine, Ser 0.70 0.44 - 1.00 mg/dL   Calcium 9.0 8.9 - 10.3 mg/dL   GFR calc non Af Amer >60 >60 mL/min   GFR calc Af Amer >60 >60 mL/min    Comment: (NOTE) The eGFR has been calculated using the CKD EPI equation. This calculation has not been validated in all clinical situations. eGFR's persistently <60 mL/min signify possible Chronic Kidney Disease.    Anion gap 10 5 - 15  CBC with Differential     Status: None   Collection Time: 11/13/15 11:50 AM  Result Value Ref Range   WBC 6.9 3.6 - 11.0 K/uL   RBC 4.53 3.80 - 5.20 MIL/uL   Hemoglobin 14.4 12.0 - 16.0 g/dL   HCT 42.8 35.0 - 47.0 %   MCV 94.6 80.0 - 100.0 fL   MCH 31.8 26.0 - 34.0 pg   MCHC 33.6 32.0 -  36.0 g/dL   RDW 13.4 11.5 - 14.5 %   Platelets 190 150 - 440 K/uL   Neutrophils Relative % 72 %   Neutro Abs 4.9 1.4 - 6.5 K/uL   Lymphocytes Relative 21 %   Lymphs Abs 1.5 1.0 - 3.6 K/uL   Monocytes Relative 6 %   Monocytes Absolute 0.4 0.2 - 0.9 K/uL   Eosinophils Relative 1 %   Eosinophils Absolute 0.1 0 - 0.7 K/uL   Basophils Relative 0 %   Basophils Absolute 0.0 0 - 0.1 K/uL  CK     Status: Abnormal   Collection Time: 11/13/15 11:50 AM  Result Value Ref Range   Total CK 1631 (H) 38 - 234 U/L  Troponin I     Status: None   Collection Time: 11/13/15 11:50 AM  Result Value Ref Range   Troponin I <0.03 <0.031 ng/mL    Comment:        NO INDICATION OF MYOCARDIAL INJURY.   TSH     Status: None   Collection Time: 11/13/15 11:50 AM  Result Value Ref Range   TSH 0.695 0.350 - 4.500 uIU/mL  Urine Drug Screen, Qualitative     Status: Abnormal   Collection Time: 11/13/15  7:57 PM  Result Value Ref Range   Tricyclic, Ur Screen NONE DETECTED NONE DETECTED   Amphetamines, Ur Screen NONE DETECTED NONE DETECTED   MDMA (Ecstasy)Ur Screen NONE DETECTED NONE DETECTED   Cocaine Metabolite,Ur [place] POSITIVE (A) NONE DETECTED   Opiate, Ur Screen POSITIVE (A) NONE DETECTED   Phencyclidine (PCP) Ur S NONE DETECTED NONE DETECTED   Cannabinoid 50 Ng, Ur [place] POSITIVE (A) NONE DETECTED   Barbiturates, Ur Screen NONE DETECTED NONE DETECTED   Benzodiazepine, Ur Scrn NONE DETECTED NONE DETECTED   Methadone Scn, Ur NONE DETECTED NONE DETECTED    Comment: (NOTE) 627  Tricyclics, urine               Cutoff 1000 ng/mL 200  Amphetamines, urine             Cutoff 1000 ng/mL 300  MDMA (Ecstasy), urine           Cutoff 500 ng/mL 400  Cocaine Metabolite, urine       Cutoff 300 ng/mL 500  Opiate, urine                   Cutoff 300 ng/mL 600  Phencyclidine (PCP), urine      Cutoff 25 ng/mL 700  Cannabinoid, urine  Cutoff 50 ng/mL 800  Barbiturates, urine             Cutoff 200 ng/mL 900   Benzodiazepine, urine           Cutoff 200 ng/mL 1000 Methadone, urine                Cutoff 300 ng/mL 1100 1200 The urine drug screen provides only a preliminary, unconfirmed 1300 analytical test result and should not be used for non-medical 1400 purposes. Clinical consideration and professional judgment should 1500 be applied to any positive drug screen result due to possible 1600 interfering substances. A more specific alternate chemical method 1700 must be used in order to obtain a confirmed analytical result.  1800 Gas chromato graphy / mass spectrometry (GC/MS) is the preferred 1900 confirmatory method.    Dg Tibia/fibula Right  11/13/2015  CLINICAL DATA:  45 year old female with right lower leg pain for the past 1-2 days. EXAM: RIGHT TIBIA AND FIBULA - 2 VIEW COMPARISON:  12/11/2014. FINDINGS: Mild deformity of the distal third of the fibular diaphysis, likely from prior healed fracture. Soft tissue anchor in the proximal tibia, likely from prior ACL repair. No acute displaced fracture of the tibia or fibula. IMPRESSION: 1. No acute radiographic abnormality of the right tibia or fibula. No significant change compared to prior. Electronically Signed   By: Vinnie Langton M.D.   On: 11/13/2015 17:31   US Venous Img Lower Unilateral Right  11/13/2015  CLINICAL DATA:  45 year old female with history of right-sided leg pain for 1 day. Prior history of deep venous thrombosis. EXAM: RIGHT LOWER EXTREMITY VENOUS DOPPLER ULTRASOUND TECHNIQUE: Gray-scale sonography with graded compression, as well as color Doppler and duplex ultrasound were performed to evaluate the lower extremity deep venous systems from the level of the common femoral vein and including the common femoral, femoral, profunda femoral, popliteal and calf veins including the posterior tibial, peroneal and gastrocnemius veins when visible. The superficial great saphenous vein was also interrogated. Spectral Doppler was utilized to  evaluate flow at rest and with distal augmentation maneuvers in the common femoral, femoral and popliteal veins. COMPARISON:  07/22/2001. FINDINGS: Contralateral Common Femoral Vein: Respiratory phasicity is normal and symmetric with the symptomatic side. No evidence of thrombus. Normal compressibility. Common Femoral Vein: No evidence of thrombus. Normal compressibility, respiratory phasicity and response to augmentation. Saphenofemoral Junction: No evidence of thrombus. Normal compressibility and flow on color Doppler imaging. Profunda Femoral Vein: No evidence of thrombus. Normal compressibility and flow on color Doppler imaging. Femoral Vein: No evidence of thrombus. Normal compressibility, respiratory phasicity and response to augmentation. Popliteal Vein: No evidence of thrombus. Normal compressibility, respiratory phasicity and response to augmentation. Calf Veins: No evidence of thrombus. Normal compressibility and flow on color Doppler imaging. Superficial Great Saphenous Vein: No evidence of thrombus. Normal compressibility and flow on color Doppler imaging. Venous Reflux:  None. Other Findings:  None. IMPRESSION: No evidence of right lower extremity deep venous thrombosis. Electronically Signed   By: Vinnie Langton M.D.   On: 11/13/2015 13:29   Dg Knee Complete 4 Views Right  11/13/2015  CLINICAL DATA:  45 year old female with right knee and lower leg pain for the past 1-2 days. Prior history of deep venous thrombosis. EXAM: RIGHT KNEE - COMPLETE 4+ VIEW COMPARISON:  12/11/2014. FINDINGS: Four views of the right knee demonstrate no acute displaced fracture, subluxation, dislocation, joint or soft tissue abnormality. Postoperative changes of prior ACL repair are again noted. There is joint space  narrowing, subchondral sclerosis, subchondral cyst formation and osteophyte formation, most severe in the lateral and patellofemoral compartments, compatible with osteoarthritis. IMPRESSION: 1. No acute  radiographic abnormality of the right knee. 2. Moderate to severe tricompartmental osteoarthritis, as above. 3. Status post ACL repair. Electronically Signed   By: Vinnie Langton M.D.   On: 11/13/2015 17:30    Review of Systems  Constitutional: Negative for fever and chills.  HENT: Negative for sore throat and tinnitus.   Eyes: Negative for blurred vision and redness.  Respiratory: Negative for cough and shortness of breath.   Cardiovascular: Negative for chest pain, palpitations, orthopnea and PND.  Gastrointestinal: Negative for nausea, vomiting, abdominal pain and diarrhea.  Genitourinary: Negative for dysuria, urgency and frequency.  Musculoskeletal: Negative for myalgias and joint pain.       Right lower extremity pain  Skin: Negative for rash.       No lesions  Neurological: Negative for speech change, focal weakness and weakness.  Endo/Heme/Allergies: Does not bruise/bleed easily.       No temperature intolerance  Psychiatric/Behavioral: Negative for depression and suicidal ideas.    Blood pressure 151/85, pulse 63, temperature 97.7 F (36.5 C), temperature source Oral, resp. rate 18, height 5' 5"  (1.651 m), weight 101.923 kg (224 lb 11.2 oz), last menstrual period 11/23/2014, SpO2 99 %. Physical Exam  Vitals reviewed. Constitutional: She is oriented to person, place, and time. She appears well-developed and well-nourished. No distress.  HENT:  Head: Normocephalic and atraumatic.  Mouth/Throat: Oropharynx is clear and moist.  Eyes: Conjunctivae and EOM are normal. Pupils are equal, round, and reactive to light. No scleral icterus.  Neck: Normal range of motion. Neck supple. No JVD present. No tracheal deviation present. No thyromegaly present.  Cardiovascular: Normal rate, regular rhythm and normal heart sounds.  Exam reveals no gallop and no friction rub.   No murmur heard. Respiratory: Effort normal and breath sounds normal.  GI: Soft. Bowel sounds are normal. She  exhibits no distension. There is no tenderness.  Genitourinary:  Deferred  Lymphadenopathy:    She has no cervical adenopathy.  Neurological: She is alert and oriented to person, place, and time. No cranial nerve deficit. She exhibits normal muscle tone.  Skin: Skin is warm and dry. No rash noted. No erythema.  Psychiatric: She has a normal mood and affect. Her behavior is normal. Judgment and thought content normal.     Assessment/Plan This is a 45 year old female admitted for rhabdomyolysis. 1. Rhabdomyolysis: Creatinine kinase 1500; renal function is good. Potassium is low rather than high. We will hydrate the patient with intravenous fluid and serially monitor CK. The patient reports that she had a DVT present in this manner previously and that it was only diagnosed with MRI. Review of the patient's chart reveals an MRI of the right knee 11 years ago that showed lateral meniscus tear of the anterior and posterior horn in addition to anterior cruciate ligament and MCL tears. There was popliteal edema present and tear of the popliteus muscle could not be excluded. One year later the patient had an ultrasound of the right lower extremity which was negative for blood clot. She reports being on blood thinners at that time. It is unclear if this study was to document clearance of her clot. Further imaging at the discretion of the primary team. 2. HIV: Patient reports undetectable viral load and good CD4 counts when checked by her infectious disease doctor recently. Continue anti-retroviral meds. 3. DVT prophylaxis: Lovenox 4. GI  prophylaxis: None The patient is a full code. Time spent on admission orders and patient care approximately 45 minutes  Harrie Foreman, MD 11/13/2015, 9:51 PM

## 2015-11-13 NOTE — ED Notes (Signed)
Explained to patient reason for having to move into the hall way due to critical patient needing a room.

## 2015-11-13 NOTE — ED Notes (Signed)
Pt came to ED via EMS c/o right leg pain. Pt has history of DVT and reports pain is similar.

## 2015-11-13 NOTE — Consult Note (Addendum)
ORTHOPAEDIC CONSULTATION  REQUESTING PHYSICIAN: Arnaldo Natal, MD  Chief Complaint:   Right posterior calf pain.  History of Present Illness: Diane Castillo is a 45 y.o. female with a history of asthma and hypertension who apparently developed pain in her right calf last night. Apparently, she was partying with her brother and some friends. She admits to smoking marijuana and having "a few drinks". When she went to get up, she had pain in her leg and was unable to bear weight. She was helped to her and by her brother. This morning, she continued to have pain in her leg and was unable to bear weight on the leg, so she was brought to the emergency room for further evaluation and treatment. Lab work in the emergency room included an elevated creatinine kinase at 1631, suggesting rhabdomyolysis. This finding, and conjunction with the findings on her her right lower leg examination raised concern for an acute compartment syndrome by the ER physician, prompting an urgent consultation with orthopedics. The patient denies any acute injury to the leg, and denies any situation where she may have had pressure on the leg for an extended period of time in the past 24 hours. She denies any numbness or paresthesias to her foot. Her past medical history is notable for having undergone an anterior cruciate ligament reconstruction approximately 13 years ago by an orthopedic surgeon at [facility]. 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  Past Medical History  Diagnosis Date  . Asthma   . Hypertension    Past Surgical History  Procedure Laterality Date  . Knee surgery    . Hand surgery    . Abdominal hysterectomy     Social History   Social History  . Marital Status: Single    Spouse Name: N/A  . Number of  Children: N/A  . Years of Education: N/A   Social History Main Topics  . Smoking status: Current Every Day Smoker -- 0.50 packs/day    Types: Cigarettes  . Smokeless tobacco: Current User  . Alcohol Use: Yes     Comment: socially  . Drug Use: None  . Sexual Activity: Not Asked   Other Topics Concern  . None   Social History Narrative   No family history on file. Allergies  Allergen Reactions  . Penicillins Hives and Swelling    Has patient had a PCN reaction causing immediate rash, facial/tongue/throat swelling, SOB or lightheadedness with hypotension: Yes Has patient had a PCN reaction causing severe rash involving mucus membranes or skin necrosis: No Has patient had a PCN reaction that required hospitalization No Has patient had a PCN reaction occurring within the last 10 years: No If all of the above answers are "NO", then may proceed with Cephalosporin use.  . Tramadol Rash   Prior to Admission medications   Medication Sig Start Date End Date Taking? Authorizing Provider  albuterol (PROVENTIL HFA) 108 (90 Base) MCG/ACT inhaler Inhale 2 puffs into the lungs every 6 (six) hours as needed. For nausea and/or vomiting. 10/03/15  Yes Historical Provider, MD  aspirin EC 81 MG tablet Take 81 mg by mouth daily.    Yes Historical Provider, MD  dolutegravir (TIVICAY) 50 MG tablet Take 50 mg by mouth daily. 05/16/15  Yes Historical Provider, MD  emtricitabine-tenofovir (TRUVADA) 200-300 MG tablet Take 1 tablet by mouth daily. 05/16/15  Yes Historical Provider, MD  valACYclovir (VALTREX) 500 MG tablet Take 500 mg by mouth 2 (two) times daily. 10/03/15  Yes Historical Provider, MD  HYDROcodone-acetaminophen (NORCO/VICODIN) 5-325 MG tablet Take 2 tablets by mouth every 6 (six) hours as needed for moderate pain. 11/13/15 11/12/16  Arnaldo NatalPaul F Malinda, MD  naproxen (NAPROSYN) 500 MG tablet Take 1 tablet (500 mg total) by mouth 2 (two) times daily with a meal. 11/08/15   Evangeline Dakinharles M Beers, PA-C   oxyCODONE-acetaminophen (ROXICET) 5-325 MG tablet Take 1-2 tablets by mouth every 4 (four) hours as needed for severe pain. 11/08/15   Evangeline Dakinharles M Beers, PA-C   Dg Tibia/fibula Right  11/13/2015  CLINICAL DATA:  45 year old female with right lower leg pain for the past 1-2 days. EXAM: RIGHT TIBIA AND FIBULA - 2 VIEW COMPARISON:  12/11/2014. FINDINGS: Mild deformity of the distal third of the fibular diaphysis, likely from prior healed fracture. Soft tissue anchor in the proximal tibia, likely from prior ACL repair. No acute displaced fracture of the tibia or fibula. IMPRESSION: 1. No acute radiographic abnormality of the right tibia or fibula. No significant change compared to prior. Electronically Signed   By: Trudie Reedaniel  Entrikin M.D.   On: 11/13/2015 17:31   [place] Venous Img Lower Unilateral Right  11/13/2015  CLINICAL DATA:  45 year old female with history of right-sided leg pain for 1 day. Prior history of deep venous thrombosis. EXAM: RIGHT LOWER EXTREMITY VENOUS DOPPLER ULTRASOUND TECHNIQUE: Gray-scale sonography with graded compression, as well as color Doppler and duplex ultrasound were performed to evaluate the lower extremity deep venous systems from the level of the common femoral vein and including the common femoral, femoral, profunda femoral, popliteal and calf veins including the posterior tibial, peroneal and gastrocnemius veins when visible. The superficial great saphenous vein was also interrogated. Spectral Doppler was utilized to evaluate flow at rest and with distal augmentation maneuvers in the common femoral, femoral and popliteal veins. COMPARISON:  07/22/2001. FINDINGS: Contralateral Common Femoral Vein: Respiratory phasicity is normal and symmetric with the symptomatic side. No evidence of thrombus. Normal compressibility. Common Femoral Vein: No evidence of thrombus. Normal compressibility, respiratory phasicity and response to augmentation. Saphenofemoral Junction: No evidence of thrombus.  Normal compressibility and flow on color Doppler imaging. Profunda Femoral Vein: No evidence of thrombus. Normal compressibility and flow on color Doppler imaging. Femoral Vein: No evidence of thrombus. Normal compressibility, respiratory phasicity and response to augmentation. Popliteal Vein: No evidence of thrombus. Normal compressibility, respiratory phasicity and response to augmentation. Calf Veins: No evidence of thrombus. Normal compressibility and flow on color Doppler imaging. Superficial Great Saphenous Vein: No evidence of thrombus. Normal compressibility and flow on color Doppler imaging. Venous Reflux:  None. Other Findings:  None. IMPRESSION: No evidence of right lower extremity deep venous thrombosis. Electronically Signed   By: Trudie Reedaniel  Entrikin M.D.   On: 11/13/2015 13:29   Dg Knee Complete 4 Views Right  11/13/2015  CLINICAL DATA:  45 year old female with right knee and lower leg pain for the past 1-2 days. Prior history of deep venous thrombosis. EXAM: RIGHT KNEE - COMPLETE 4+ VIEW COMPARISON:  12/11/2014. FINDINGS: Four views of the right knee demonstrate no acute displaced fracture, subluxation, dislocation, joint or soft tissue abnormality. Postoperative changes of prior ACL repair are again noted. There is joint space narrowing, subchondral sclerosis, subchondral cyst formation and osteophyte formation, most severe in the lateral and patellofemoral compartments, compatible with osteoarthritis. IMPRESSION: 1. No acute radiographic abnormality of the right knee. 2. Moderate  to severe tricompartmental osteoarthritis, as above. 3. Status post ACL repair. Electronically Signed   By: Trudie Reed M.D.   On: 11/13/2015 17:30    Positive ROS: All other systems have been reviewed and were otherwise negative with the exception of those mentioned in the HPI and as above.  Physical Exam: General:  Alert, no acute distress Psychiatric:  Patient is competent for consent with normal mood and  affect   Cardiovascular:  No pedal edema Respiratory:  No wheezing, non-labored breathing GI:  Abdomen is soft and non-tender Skin:  No lesions in the area of chief complaint Neurologic:  Sensation intact distally Lymphatic:  No axillary or cervical lymphadenopathy  Orthopedic Exam:  Orthopedic examination is limited to right lower extremity and foot. The patient has moderate tenderness to palpation in the calf muscle region, especially in the area of the medial gastrocnemius muscle at the muscle-tendon junction. She has only mild tenderness to palpation laterally over the lateral and anterior compartments. None of the compartments appear tense. She is able to actively dorsiflex her and plantarflex her toes. She is able to plantarflex her foot and actively dorsiflex to -20. She has reproduction of her pain with any attempted active or passive dorsiflexion beyond this point. She has a 1-2+ dorsalis pedis and posterior tibial pulse. Sensation is intact to all distributions of her right foot.  Although the compartments feel soft, the patient's unexplained elevated creatinine kinase in conjunction with her pain on passive dorsiflexion of her ankle raise the spectrum for compartment syndrome. Therefore, after obtaining verbal consent, the patient's superficial and deep posterior compartment pressures were measured using the Stryker compartment pressure measuring device using sterile technique after anesthetizing the skin with 1% lidocaine. The superficial posterior compartment pressure measured 12 mmHg and the deep posterior compartment pressure measured 14 mmHg.  X-rays:  X-rays of the right knee are available for review. These films demonstrate postsurgical changes consistent with her history of an anterior cruciate ligament reconstruction with a bone staple in the anterior tibial region. Moderate-severe degenerative changes are noted in all compartments, but primarily in the lateral compartment where  there appears to be bone-on-bone contact. No lytic lesions or fractures are identified.  X-rays of the right tibia also are available for review. These films demonstrate no evidence for fractures, lytic lesions, or other bony abnormalities.  Assessment: Severe right calf pain of unclear etiology, possibly secondary to medial gastrocnemius injury.  Plan: The treatment options are discussed with the patient and her family who are at the bedside. Given the normal pressures, I do not believe she has a compartment syndrome to explain the elevated serum creatinine kinase. Therefore, most likely she sustained a medial gastrocnemius rupture which can be managed nonsurgically. She will be placed into a posterior splint by the ER staff with the ankle as close to neutral as possible in order to keep her comfortable. Most likely, she will be admitted to the hospitalist service for IV hydration and close observation of her renal status as treatment of her rhabdomyolysis.  Thank you for asking me to participate in the care of this pleasant woman. I will be happy to follow her with you while she is in the hospital.   Maryagnes Amos, MD  Beeper #:  [phone]  11/13/2015 6:30 PM

## 2015-11-13 NOTE — Discharge Instructions (Signed)
Musculoskeletal Pain Musculoskeletal pain is muscle and boney aches and pains. These pains can occur in any part of the body. Your caregiver may treat you without knowing the cause of the pain. They may treat you if blood or urine tests, X-rays, and other tests were normal.  CAUSES There is often not a definite cause or reason for these pains. These pains may be caused by a type of germ (virus). The discomfort may also come from overuse. Overuse includes working out too hard when your body is not fit. Boney aches also come from weather changes. Bone is sensitive to atmospheric pressure changes. HOME CARE INSTRUCTIONS   Ask when your test results will be ready. Make sure you get your test results.  Only take over-the-counter or prescription medicines for pain, discomfort, or fever as directed by your caregiver. If you were given medications for your condition, do not drive, operate machinery or power tools, or sign legal documents for 24 hours. Do not drink alcohol. Do not take sleeping pills or other medications that may interfere with treatment.  Continue all activities unless the activities cause more pain. When the pain lessens, slowly resume normal activities. Gradually increase the intensity and duration of the activities or exercise.  During periods of severe pain, bed rest may be helpful. Lay or sit in any position that is comfortable.  Putting ice on the injured area.  Put ice in a bag.  Place a towel between your skin and the bag.  Leave the ice on for 15 to 20 minutes, 3 to 4 times a day.  Follow up with your caregiver for continued problems and no reason can be found for the pain. If the pain becomes worse or does not go away, it may be necessary to repeat tests or do additional testing. Your caregiver may need to look further for a possible cause. SEEK IMMEDIATE MEDICAL CARE IF:  You have pain that is getting worse and is not relieved by medications.  You develop chest pain  that is associated with shortness or breath, sweating, feeling sick to your stomach (nauseous), or throw up (vomit).  Your pain becomes localized to the abdomen.  You develop any new symptoms that seem different or that concern you. MAKE SURE YOU:   Understand these instructions.  Will watch your condition.  Will get help right away if you are not doing well or get worse.   This information is not intended to replace advice given to you by your health care provider. Make sure you discuss any questions you have with your health care provider.   Document Released: 06/04/2005 Document Revised: 08/27/2011 Document Reviewed: 02/06/2013 Elsevier Interactive Patient Education 2016 [employer]   Use the Vicodin if needed for pain after that finished use Tylenol or Motrin if needed for pain. Please follow-up with your doctor. Return if you're worse. Return especially if they're related gets red hot more swollen or any other problems develop.

## 2015-11-13 NOTE — ED Provider Notes (Addendum)
-----------------------------------------   6:01 PM on 11/13/2015 -----------------------------------------  Signed out to me by Dr. Juliette AlcideMelinda at 5:00 PM. Patient with rhabdomyolysis for which she is giving her fluid with a question of a compartment syndrome. Dr. Joice LoftsPoggi has been kind enough to see the patient he is evaluating her for that entity. Patient is in no acute distress at this time, but is warm and well perfused. We are waiting input from orthopedic surgery. Patient is in no acute distress at this time.  Diane PlantJames A Attallah Ontko, MD 11/13/15 1802  ----------------------------------------- 6:17 PM on 11/13/2015 -----------------------------------------  Patient pressures were normal there is no evidence of compartment syndrome. According to Dr. Dory PeruPoggi  Diane Castillo A Ender Rorke, MD 11/13/15 [phone]

## 2015-11-13 NOTE — ED Provider Notes (Addendum)
[facility] Emergency Department Provider Note   ____________________________________________  Time seen: Approximately 11:52 AM  I have reviewed the triage vital signs and the nursing notes.   HISTORY  Chief Complaint Leg Pain    HPI Diane Castillo is a 45 y.o. female who reports her right leg is painful and swollen spelled that way for a day or so. She says it feels like when she had her DVT in her right leg. She has a complains a little bit of tightness in the epigastric area there is no shortness breath nausea or sweating with it. She is unsure when the tightness started. The tightness is mild. Nothing seems to make it better or worse.   Past Medical History  Diagnosis Date  . Asthma   . Hypertension     There are no active problems to display for this patient.   Past Surgical History  Procedure Laterality Date  . Knee surgery    . Hand surgery    . Abdominal hysterectomy      Current Outpatient Rx  Name  Route  Sig  Dispense  Refill  . aspirin 81 MG tablet   Oral   Take 81 mg by mouth daily.         Marland Kitchen. HYDROcodone-acetaminophen (NORCO/VICODIN) 5-325 MG tablet   Oral   Take 2 tablets by mouth every 6 (six) hours as needed for moderate pain.   10 tablet   0   . naproxen (NAPROSYN) 500 MG tablet   Oral   Take 1 tablet (500 mg total) by mouth 2 (two) times daily with a meal.   60 tablet   0   . oxyCODONE-acetaminophen (ROXICET) 5-325 MG tablet   Oral   Take 1-2 tablets by mouth every 4 (four) hours as needed for severe pain.   15 tablet   0     Allergies Penicillins and Tramadol  No family history on file.  Social History Social History  Substance Use Topics  . Smoking status: Current Every Day Smoker -- 0.50 packs/day    Types: Cigarettes  . Smokeless tobacco: Current User  . Alcohol Use: Yes     Comment: socially    Review of Systems Constitutional: No fever/chills Eyes: No visual changes. ENT: No sore  throat. Cardiovascular:See history of present illness Respiratory: Denies shortness of breath. Gastrointestinal: No abdominal pain.  No nausea, no vomiting.  No diarrhea.  No constipation. Genitourinary: Negative for dysuria. Musculoskeletal: Negative for back pain. Skin: Negative for rash. Neurological: Negative for headaches, focal weakness or numbness.  10-point ROS otherwise negative.  ____________________________________________   PHYSICAL EXAM:  VITAL SIGNS: ED Triage Vitals  Enc Vitals Group     BP 11/13/15 1144 137/90 mmHg     Pulse Rate 11/13/15 1144 68     Resp 11/13/15 1144 14     Temp 11/13/15 1144 98.3 F (36.8 C)     Temp Source 11/13/15 1144 Oral     SpO2 11/13/15 1144 100 %     Weight 11/13/15 1144 183 lb (83.008 kg)     Height 11/13/15 1144 5\' 5"  (1.651 m)     Head Cir --      Peak Flow --      Pain Score --      Pain Loc --      Pain Edu? --      Excl. in GC? --     Constitutional: Alert and oriented. Well appearing and in no  acute distress. Eyes: Conjunctivae are normal. PERRL. EOMI. Head: Atraumatic. Nose: No congestion/rhinnorhea. Mouth/Throat: Mucous membranes are moist.  Oropharynx non-erythematous. Neck: No stridor. Cardiovascular: Normal rate, regular rhythm. Grossly normal heart sounds.  Good peripheral circulation. Respiratory: Normal respiratory effort.  No retractions. Lungs CTAB. Gastrointestinal: Soft and nontender. No distention. No abdominal bruits. No CVA tenderness. Musculoskeletal: Right leg is tender to touch. Trace edema bilaterally.  No joint effusions. Neurologic:  Normal speech and language. No gross focal neurologic deficits are appreciated. No gait instability. Skin:  Skin is warm, dry and intact. No rash noted. Psychiatric: Mood and affect are normal. Speech and behavior are normal.  ____________________________________________   LABS (all labs ordered are listed, but only abnormal results are displayed)  Labs Reviewed   BASIC METABOLIC PANEL - Abnormal; Notable for the following:    Potassium 3.4 (*)    CO2 20 (*)    Glucose, Bld 161 (*)    All other components within normal limits  CK - Abnormal; Notable for the following:    Total CK 1631 (*)    All other components within normal limits  CBC WITH DIFFERENTIAL/PLATELET  TROPONIN I   ____________________________________________  [facility] read and interpreted by me shows normal sinus rhythm rate of 68 normal axis essentially normal EKG. ____________________________________________  RADIOLOGY  Ultrasound shows no evidence of DVT ____________________________________________   PROCEDURES    ____________________________________________   INITIAL IMPRESSION / ASSESSMENT AND PLAN / ED COURSE  Pertinent labs & imaging results that were available during my care of the patient were reviewed by me and considered in my medical decision making (see chart for details). Discussed fact ultrasound was negative with patient patient says previously she had a negative ultrasoundAnd then someone did a MRI that showed a DVT in her legs. I reviewed the old records back to 2005 there is no evidence of an MRI that showed a DVT at this hospital which is where she said this happened. I discussed the patient with radiology also. Radiologist said that the ultrasound is the best test and there is no indication to do an MRI for checking for DVT. We have examined the patient show that her leg is still tender to hurts to move any of the joints in her leg. I am attempting to contact orthopedics. In the meantime we will get the hospitalist to admit her for rhabdomyolysis.  ____________________________________________   FINAL CLINICAL IMPRESSION(S) / ED DIAGNOSES  Final diagnoses:  Pain of right lower extremity  Non-traumatic rhabdomyolysis      NEW MEDICATIONS STARTED DURING THIS VISIT:  New Prescriptions   HYDROCODONE-ACETAMINOPHEN (NORCO/VICODIN) 5-325 MG TABLET     Take 2 tablets by mouth every 6 (six) hours as needed for moderate pain.     Note:  This document was prepared using Dragon voice recognition software and may include unintentional dictation errors.    Arnaldo Natal, MD 11/13/15 1606  ----------------------------------------- 4:29 PM on 11/13/2015 -----------------------------------------  At this time patient still has normal dorsalis pedis pulses the leg is still soft but the patient still does not want to bend her toes or ankle shows it hurts too much Dr. Robynn Pane is going a come in to evaluate her and see if he thinks she could possibly have compartment syndrome. Hospitalist is aware and will admit if she does not.  Arnaldo Natal, MD 11/13/15 1630

## 2015-11-13 NOTE — ED Notes (Signed)
Lab notified to run CK and troponin

## 2015-11-13 NOTE — ED Notes (Signed)
Apologized again to patient about having to move rooms several times.  Explained to patient that a hospital will come to talk to the patient about being admitted. Patient and visitor verbalized understanding.  Warm blanket given and lights dimmed for comfort. [facility]

## 2015-11-14 DIAGNOSIS — F1910 Other psychoactive substance abuse, uncomplicated: Secondary | ICD-10-CM

## 2015-11-14 DIAGNOSIS — M79604 Pain in right leg: Secondary | ICD-10-CM

## 2015-11-14 DIAGNOSIS — S86119A Strain of other muscle(s) and tendon(s) of posterior muscle group at lower leg level, unspecified leg, initial encounter: Secondary | ICD-10-CM

## 2015-11-14 DIAGNOSIS — R739 Hyperglycemia, unspecified: Secondary | ICD-10-CM

## 2015-11-14 DIAGNOSIS — E872 Acidosis, unspecified: Secondary | ICD-10-CM

## 2015-11-14 DIAGNOSIS — E876 Hypokalemia: Secondary | ICD-10-CM

## 2015-11-14 LAB — BASIC METABOLIC PANEL
Anion gap: 5 (ref 5–15)
BUN: 16 mg/dL (ref 6–20)
CALCIUM: 8.3 mg/dL — AB (ref 8.9–10.3)
CO2: 23 mmol/L (ref 22–32)
CREATININE: 0.77 mg/dL (ref 0.44–1.00)
Chloride: 109 mmol/L (ref 101–111)
Glucose, Bld: 109 mg/dL — ABNORMAL HIGH (ref 65–99)
Potassium: 3.7 mmol/L (ref 3.5–5.1)
SODIUM: 137 mmol/L (ref 135–145)

## 2015-11-14 LAB — CK: Total CK: 1095 U/L — ABNORMAL HIGH (ref 38–234)

## 2015-11-14 MED ORDER — DOCUSATE SODIUM 100 MG PO CAPS: 100.0000 mg | ORAL_CAPSULE | Freq: Two times a day (BID) | ORAL | Status: DC

## 2015-11-14 MED ORDER — OXYCODONE HCL 5 MG PO TABS: 5.0000 mg | ORAL_TABLET | ORAL | Status: DC | PRN

## 2015-11-14 MED ORDER — OXYCODONE HCL 5 MG PO TABS
5.0000 mg | ORAL_TABLET | ORAL | Status: DC | PRN
  Administered 2015-11-14: 5 mg via ORAL
  Filled 2015-11-14: qty 1

## 2015-11-14 NOTE — Progress Notes (Signed)
Subjective: Patient still complains of right calf pain, but denies any numbness to her foot. No new complaints.   Objective: Vital signs in last 24 hours: Temp:  [97.7 F (36.5 C)-98.3 F (36.8 C)] 98.2 F (36.8 C) (05/29 0418) Pulse Rate:  [56-68] 64 (05/29 0418) Resp:  [14-19] 18 (05/29 0418) BP: (134-162)/(83-98) 162/98 mmHg (05/29 0418) SpO2:  [99 %-100 %] 100 % (05/29 0418) Weight:  [83.008 kg (183 lb)-105.098 kg (231 lb 11.2 oz)] 105.098 kg (231 lb 11.2 oz) (05/29 0418)  Intake/Output from previous day: 05/28 0701 - 05/29 0700 In: 835 [P.O.:240; I.V.:595] Out: -  Intake/Output this shift:     Recent Labs  11/13/15 1150  HGB 14.4    Recent Labs  11/13/15 1150  WBC 6.9  RBC 4.53  HCT 42.8  PLT 190    Recent Labs  11/13/15 1150  NA 136  K 3.4*  CL 106  CO2 20*  BUN 13  CREATININE 0.70  GLUCOSE 161*  CALCIUM 9.0   No results for input(s): LABPT, INR in the last 72 hours.  Physical Exam: Posterior splint dry and intact. Able to dorsiflex and plantarflex toes without difficulty. Sensation intact to deep peroneal, superficial peroneal and plantar distributions. Good capillary refill to all toes.  Assessment: S/P Right gastroc. Injury.  Plan: PT evaluation for gait, balance, and transfer training nonweightbearing on right lower extremity, using walker or crutches. Patient can be discharged home when cleared by physical therapy and by medicine. Please arrange follow-up appointment for patient to be seen in my office by either myself or Horris LatinoLance McGhee, PA-C, in 7-10 days.  Thank you for asking me to participate in the care of this pleasant woman.    Excell SeltzerJohn J Kenitha Glendinning 11/14/2015, 8:44 AM

## 2015-11-14 NOTE — Progress Notes (Signed)
Diane AcostaSarah A Castillo to be D/C'd Home per MD order.  Discussed with the patient and all questions fully answered.  VSS, Skin clean, dry and intact without evidence of skin break down, no evidence of skin tears noted. IV catheter discontinued intact. Site without signs and symptoms of complications. Dressing and pressure applied.  An After Visit Summary was printed and given to the patient. Patient received prescription.  D/c education completed with patient/family including follow up instructions, medication list, d/c activities limitations if indicated, with other d/c instructions as indicated by MD - patient able to verbalize understanding, all questions fully answered.   Patient instructed to return to ED, call 911, or call MD for any changes in condition.   Patient left with husband.   Harvie HeckMelanie Krystyn Picking 11/14/2015 4:40 PM

## 2015-11-14 NOTE — Evaluation (Signed)
Physical Therapy Evaluation Patient Details Name: Diane Castillo MRN: [id number] DOB: 06-Nov-1970 Today's Date: 11/14/2015   History of Present Illness  45 yo female with gastroc pain and cleared for tib/fib fracture and DVT was referred to PT to assess her for gait and plans for home.  Clinical Impression  Pt went through crutches, walker and verbal cues for completion of gait on steps and floor walking with crutches being her main convenient walking style for home on steps.   Pt is motivated and did try all versions of walking devices on the steps before deciding on the crutches.    Follow Up Recommendations Home health PT;Supervision - Intermittent    Equipment Recommendations  Crutches    Recommendations for Other Services       Precautions / Restrictions Precautions Precautions: Fall Precaution Comments: NWB on RLE Restrictions Other Position/Activity Restrictions: NWB RLE      Mobility  Bed Mobility Overal bed mobility: Modified Independent                Transfers Overall transfer level: Modified independent Equipment used: 1 person hand held assist;Crutches                Ambulation/Gait Ambulation/Gait assistance: Supervision;Min guard Ambulation Distance (Feet): 25 Feet Assistive device: Crutches;1 person hand held assist   Gait velocity: reduced Gait velocity interpretation: Below normal speed for age/gender General Gait Details: hopping on LLE to clear foot on the floor using both RW and crutches to see what is comfortable  Stairs Stairs: Yes Stairs assistance: Supervision Stair Management: No rails;With crutches;With walker Number of Stairs: 4 General stair comments: used No railing on step to see how pt handles since she is only going to have one rail on R on stairs to get in the house  Wheelchair Mobility    Modified Rankin (Stroke Patients Only)       Balance Overall balance assessment: Modified Independent                                            Pertinent Vitals/Pain Pain Assessment: No/denies pain    Home Living Family/patient expects to be discharged to:: Private residence Living Arrangements: Spouse/significant other Available Help at Discharge: Family;Available PRN/intermittently Type of Home: House Home Access: Stairs to enter Entrance Stairs-Rails: Right Entrance Stairs-Number of Steps: 3 Home Layout: One level Home Equipment: None Additional Comments: needs crutches    Prior Function Level of Independence: Independent               Hand Dominance   Dominant Hand: Right    Extremity/Trunk Assessment   Upper Extremity Assessment: Overall WFL for tasks assessed           Lower Extremity Assessment: Overall WFL for tasks assessed      Cervical / Trunk Assessment: Normal  Communication   Communication: No difficulties  Cognition Arousal/Alertness: Awake/alert Behavior During Therapy: WFL for tasks assessed/performed Overall Cognitive Status: Within Functional Limits for tasks assessed                      General Comments General comments (skin integrity, edema, etc.): Pt is able to transition from standing to crutches with no assist as she was steady on LLE    Exercises        Assessment/Plan    PT Assessment Patient needs continued PT services  PT Diagnosis Difficulty walking   PT Problem List Decreased range of motion;Decreased activity tolerance;Decreased mobility;Obesity;Decreased skin integrity;Decreased knowledge of use of DME;Decreased safety awareness  PT Treatment Interventions Gait training;Stair training;DME instruction;Functional mobility training;Therapeutic activities;Therapeutic exercise;Balance training;Neuromuscular re-education;Patient/family education   PT Goals (Current goals can be found in the Care Plan section) Acute Rehab PT Goals Patient Stated Goal: To get home and be safe PT Goal Formulation: With  patient/family Time For Goal Achievement: 11/21/15 Potential to Achieve Goals: Good    Frequency Min 2X/week   Barriers to discharge Inaccessible home environment;Decreased caregiver support home with husband at work    Co-evaluation               End of Session Equipment Utilized During Treatment: Gait belt Activity Tolerance: Patient tolerated treatment well Patient left: in chair;with call bell/phone within reach;with family/visitor present Nurse Communication: Mobility status    Functional Assessment Tool Used: clinical judgement Functional Limitation: Mobility: Walking and moving around Mobility: Walking and Moving Around Current Status [phone](G8978): At least 40 percent but less than 60 percent impaired, limited or restricted Mobility: Walking and Moving Around Goal Status [phone](G8979): At least 1 percent but less than 20 percent impaired, limited or restricted    Time: 1424-1505 PT Time Calculation (min) (ACUTE ONLY): 41 min   Charges:   PT Evaluation $PT Eval Moderate Complexity: 1 Procedure PT Treatments $Gait Training: 23-37 mins   PT G Codes:   PT G-Codes **NOT FOR INPATIENT CLASS** Functional Assessment Tool Used: clinical judgement Functional Limitation: Mobility: Walking and moving around Mobility: Walking and Moving Around Current Status ([phone]): At least 40 percent but less than 60 percent impaired, limited or restricted Mobility: Walking and Moving Around Goal Status [phone]): At least 1 percent but less than 20 percent impaired, limited or restricted    Ivar DrapeStout, Necha Harries E 11/14/2015, 3:45 PM    Samul Dadauth Jenevie Casstevens, PT MS Acute Rehab Dept. [facility]: [facility] [phone] and [facility] [phone]

## 2015-11-14 NOTE — Care Management Note (Addendum)
Case Management Note  Patient Details  Name: Diane AcostaSarah A Castillo MRN: [id number] Date of Birth: 08/16/1970  Subjective/Objective:   Referral for DME crutches and home health PT called to Feliberto GottronJason Hinton at [facility]. Barbara CowerJason reports that Diane Castillo does not have a diagnosis that qualifies her for HH-PT, but that he will deliver crutches to her room.                Action/Plan:   Expected Discharge Date:                  Expected Discharge Plan:     In-House Referral:     Discharge planning Services     Post Acute Care Choice:    Choice offered to:     DME Arranged:    DME Agency:     HH Arranged:    HH Agency:     Status of Service:     Medicare Important Message Given:    Date Medicare IM Given:    Medicare IM give by:    Date Additional Medicare IM Given:    Additional Medicare Important Message give by:     If discussed at Long Length of Stay Meetings, dates discussed:    Additional Comments:  Kiara Mcdowell A, RN 11/14/2015, 3:19 PM

## 2015-11-14 NOTE — Progress Notes (Signed)
  Nov 14, 2015  Patient: Diane Castillo  Date of Birth: 01/18/1971  Date of Visit: 11/13/2015    To Whom It May Concern:  Dub MikesSarah Castillo was seen and treated in our emergency department on 11/13/2015. Diane AcostaSarah A Castillo  may return to work on 11/21/2015.  Sincerely,   Harvie HeckMelanie Guido Comp RN

## 2015-11-14 NOTE — Progress Notes (Signed)
[facility] - [place] at [facility]   PATIENT NAME: Diane Castillo    MR#:  [id number]  DATE OF BIRTH:  1970/08/29  SUBJECTIVE:  CHIEF COMPLAINT:   Chief Complaint  Patient presents with  . Leg Pain  Patient is a 45 year old African-American female with past medical history significant for history of asthma, hypertension, HIV, who presents to the hospital with complaints of right calf pain. Apparently she felt pain in the right calf . One night prior to admission, she tried to stand from sitting position and fell down due to weakness and pain, in the morning she was unable to walk and presented to the hospital for further evaluation. In emergency room, she was seen by the orthopedic surgeon, do to Concern  of compartment syndrome. Dr. bulging. Patient in consultation and felt that it was likely due to medial gastrocnemius injury, which was recommended to be managed nonsurgically, including posterior splint. Patient was admitted to the hospital for IV hydration. Patient complains of significant pain in lower leg, not helped by morphine. Ultrasound of right lower extremity showed no DVT.  Review of Systems  Constitutional: Negative for fever, chills and weight loss.  HENT: Negative for congestion.   Eyes: Negative for blurred vision and double vision.  Respiratory: Negative for cough, sputum production, shortness of breath and wheezing.   Cardiovascular: Negative for chest pain, palpitations, orthopnea, leg swelling and PND.  Gastrointestinal: Negative for nausea, vomiting, abdominal pain, diarrhea, constipation and blood in stool.  Genitourinary: Negative for dysuria, urgency, frequency and hematuria.  Musculoskeletal: Positive for myalgias and falls.  Neurological: Negative for dizziness, tremors, focal weakness and headaches.  Endo/Heme/Allergies: Does not bruise/bleed easily.  Psychiatric/Behavioral: Negative for depression. The patient does not have insomnia.      VITAL SIGNS: Blood pressure 143/95, pulse 59, temperature 98.3 F (36.8 C), temperature source Oral, resp. rate 17, height  (1.651 m), weight 105.098 kg (231 lb 11.2 oz), last menstrual period 11/23/2014, SpO2 100 %.  PHYSICAL EXAMINATION:   GENERAL:  45 y.o.-year-old patient lying in the bed with no acute distress.  EYES: Pupils equal, round, reactive to light and accommodation. No scleral icterus. Extraocular muscles intact.  HEENT: Head atraumatic, normocephalic. Oropharynx and nasopharynx clear.  NECK:  Supple, no jugular venous distention. No thyroid enlargement, no tenderness.  LUNGS: Normal breath sounds bilaterally, no wheezing, rales,rhonchi or crepitation. No use of accessory muscles of respiration.  CARDIOVASCULAR: S1, S2 normal. No murmurs, rubs, or gallops.  ABDOMEN: Soft, nontender, nondistended. Bowel sounds present. No organomegaly or mass.  EXTREMITIES: No pedal edema, cyanosis, or clubbing. Right lower leg is in splint with free right knee, significant pain and gastrocnemius muscle. Palpation, no significant swelling NEUROLOGIC: Cranial nerves II through XII are intact. Muscle strength 5/5 in all extremities. Sensation intact. Gait not checked.  PSYCHIATRIC: The patient is alert and oriented x 3.  SKIN: No obvious rash, lesion, or ulcer.   ORDERS/RESULTS REVIEWED:   CBC  Recent Labs Lab 11/13/15 1150  WBC 6.9  HGB 14.4  HCT 42.8  PLT 190  MCV 94.6  MCH 31.8  MCHC 33.6  RDW 13.4  LYMPHSABS 1.5  MONOABS 0.4  EOSABS 0.1  BASOSABS 0.0   ------------------------------------------------------------------------------------------------------------------  Chemistries   Recent Labs Lab 11/13/15 1150  NA 136  K 3.4*  CL 106  CO2 20*  GLUCOSE 161*  BUN 13  CREATININE 0.70  CALCIUM 9.0   ------------------------------------------------------------------------------------------------------------------ estimated creatinine clearance is 109.1 mL/min  (by C-G formula  based on Cr of 0.7). ------------------------------------------------------------------------------------------------------------------  Recent Labs  11/13/15 1150  TSH 0.695    Cardiac Enzymes  Recent Labs Lab 11/13/15 1150  TROPONINI <0.03   ------------------------------------------------------------------------------------------------------------------ Invalid input(s): POCBNP ---------------------------------------------------------------------------------------------------------------  RADIOLOGY: Dg Tibia/fibula Right  11/13/2015  CLINICAL DATA:  45 year old female with right lower leg pain for the past 1-2 days. EXAM: RIGHT TIBIA AND FIBULA - 2 VIEW COMPARISON:  12/11/2014. FINDINGS: Mild deformity of the distal third of the fibular diaphysis, likely from prior healed fracture. Soft tissue anchor in the proximal tibia, likely from prior ACL repair. No acute displaced fracture of the tibia or fibula. IMPRESSION: 1. No acute radiographic abnormality of the right tibia or fibula. No significant change compared to prior. Electronically Signed   By: Trudie Reedaniel  Entrikin M.D.   On: 11/13/2015 17:31   [place] Venous Img Lower Unilateral Right  11/13/2015  CLINICAL DATA:  45 year old female with history of right-sided leg pain for 1 day. Prior history of deep venous thrombosis. EXAM: RIGHT LOWER EXTREMITY VENOUS DOPPLER ULTRASOUND TECHNIQUE: Gray-scale sonography with graded compression, as well as color Doppler and duplex ultrasound were performed to evaluate the lower extremity deep venous systems from the level of the common femoral vein and including the common femoral, femoral, profunda femoral, popliteal and calf veins including the posterior tibial, peroneal and gastrocnemius veins when visible. The superficial great saphenous vein was also interrogated. Spectral Doppler was utilized to evaluate flow at rest and with distal augmentation maneuvers in the common femoral, femoral  and popliteal veins. COMPARISON:  07/22/2001. FINDINGS: Contralateral Common Femoral Vein: Respiratory phasicity is normal and symmetric with the symptomatic side. No evidence of thrombus. Normal compressibility. Common Femoral Vein: No evidence of thrombus. Normal compressibility, respiratory phasicity and response to augmentation. Saphenofemoral Junction: No evidence of thrombus. Normal compressibility and flow on color Doppler imaging. Profunda Femoral Vein: No evidence of thrombus. Normal compressibility and flow on color Doppler imaging. Femoral Vein: No evidence of thrombus. Normal compressibility, respiratory phasicity and response to augmentation. Popliteal Vein: No evidence of thrombus. Normal compressibility, respiratory phasicity and response to augmentation. Calf Veins: No evidence of thrombus. Normal compressibility and flow on color Doppler imaging. Superficial Great Saphenous Vein: No evidence of thrombus. Normal compressibility and flow on color Doppler imaging. Venous Reflux:  None. Other Findings:  None. IMPRESSION: No evidence of right lower extremity deep venous thrombosis. Electronically Signed   By: Trudie Reedaniel  Entrikin M.D.   On: 11/13/2015 13:29   Dg Knee Complete 4 Views Right  11/13/2015  CLINICAL DATA:  45 year old female with right knee and lower leg pain for the past 1-2 days. Prior history of deep venous thrombosis. EXAM: RIGHT KNEE - COMPLETE 4+ VIEW COMPARISON:  12/11/2014. FINDINGS: Four views of the right knee demonstrate no acute displaced fracture, subluxation, dislocation, joint or soft tissue abnormality. Postoperative changes of prior ACL repair are again noted. There is joint space narrowing, subchondral sclerosis, subchondral cyst formation and osteophyte formation, most severe in the lateral and patellofemoral compartments, compatible with osteoarthritis. IMPRESSION: 1. No acute radiographic abnormality of the right knee. 2. Moderate to severe tricompartmental osteoarthritis,  as above. 3. Status post ACL repair. Electronically Signed   By: Trudie Reedaniel  Entrikin M.D.   On: 11/13/2015 17:30    EKG:  Orders placed or performed during the hospital encounter of 11/13/15  . EKG 12-Lead  . EKG 12-Lead    ASSESSMENT AND PLAN:  Active Problems:   Rhabdomyolysis #1 Right lower extremity  Calf pain Of unclear etiology, likely  medial gastrocnemius muscle injury, get MRI of the muscle. Continue pain medications, adding oxycodone #2. Mild rhabdomyolysis, improving on IV fluid administration, follow creatinine, TSH was normal #3. Hypokalemia, check potassium level today #4. Acidosis, repeat BMP #5. Hyperglycemia, hemoglobin A1c was 5.0, no diabetes #6. Multiple drug abuse including cannabinoids, opiates and cocaine, which is likely reason for abdominal lysis and likely fall, discussed this patient., Counseled, she voiced understanding  Management plans discussed with the patient, family and they are in agreement.   DRUG ALLERGIES:  Allergies  Allergen Reactions  . Penicillins Hives and Swelling    Has patient had a PCN reaction causing immediate rash, facial/tongue/throat swelling, SOB or lightheadedness with hypotension: Yes Has patient had a PCN reaction causing severe rash involving mucus membranes or skin necrosis: No Has patient had a PCN reaction that required hospitalization No Has patient had a PCN reaction occurring within the last 10 years: No If all of the above answers are "NO", then may proceed with Cephalosporin use.  . Tramadol Rash    CODE STATUS:     Code Status Orders        Start     Ordered   11/13/15 2021  Full code   Continuous     11/13/15 2020    Code Status History    Date Active Date Inactive Code Status Order ID Comments User Context   This patient has a current code status but no historical code status.      TOTAL TIME TAKING CARE OF THIS PATIENT: 40 minutes.  Discussed this family  Jahzion Brogden M.D on 11/14/2015 at 9:59  AM  Between 7am to 6pm - Pager - [phone]  After 6pm go to [email or web address] - password [email or web address]  [facility]  Office  [phone]  CC: Primary care physician; Pcp Not In System

## 2015-11-14 NOTE — Progress Notes (Signed)
Patient is requesting to be able to go outside. RN doesn't feel comfortable with allowing the patient to go outside. RN contacted MD Winona LegatoVaickute, MD said that she can discharge her today, so patient will remain in room until discharge.   Harvie HeckMelanie Niya Behler, RN

## 2015-11-14 NOTE — Discharge Summary (Signed)
[facility] - [place] [facility]   PATIENT NAME: Diane Castillo    MR#:  [id number]  DATE OF BIRTH:  02-16-1971  DATE OF ADMISSION:  11/13/2015 ADMITTING PHYSICIAN: Arnaldo Natal, MD  DATE OF DISCHARGE: 11/14/2015  5:52 PM  PRIMARY CARE PHYSICIAN: Pcp Not In System     ADMISSION DIAGNOSIS:  Non-traumatic rhabdomyolysis [M62.82] Pain of right lower extremity [M79.604]  DISCHARGE DIAGNOSIS:  Principal Problem:   Right leg pain Active Problems:   Gastrocnemius muscle strain   Rhabdomyolysis   Hypokalemia   Metabolic acidosis   Hyperglycemia   Drug abuse   SECONDARY DIAGNOSIS:   Past Medical History  Diagnosis Date  . Asthma   . Hypertension   . HIV disease (HCC)     .pro HOSPITAL COURSE:  Patient is a 45 year old African-American female with past medical history significant for history of asthma, hypertension, HIV, who presents to the hospital with complaints of right calf pain. Apparently she felt pain in the right calf . One night prior to admission, she tried to stand from sitting position and fell down due to weakness and pain, in the morning she was unable to walk and presented to the hospital for further evaluation. In emergency room, she was seen by the orthopedic surgeon, do to Concern of compartment syndrome. Dr. bulging. Patient in consultation and felt that it was likely due to medial gastrocnemius injury, which was recommended to be managed nonsurgically, including posterior splint. Patient was admitted to the hospital for IV hydration. Marland Kitchen Ultrasound of right lower extremity showed no DVT. With conservative therapy patient improved, was felt to be stable to be discharged home. She was evaluated by physical therapy recommended home health services, crutches. She is to follow up with DR Poggi in about 1 week/ Discussion by problem: #1 Right lower extremity calf pain , likely medial gastrocnemius muscle injury, per orthopedic surgeon dr.  Joice Lofts. Continue pain control with  Oxycodone, physical therapy, crutches for ambulation, follow up with DR Poggi in about 1 week #2. Mild rhabdomyolysis, improving on IV fluid administration, follow creatinine as outpatient, likely cocaine related, TSH was normal #3. Hypokalemia, potassium level normalized with therapy #4. Acidosis, resolved on IVF #5. Hyperglycemia, hemoglobin A1c was 5.0, no diabetes #6. Multiple drug abuse including cannabinoids, opiates and cocaine, which is likely reason for rabdomyolysis and likely fall, discussed with the patient., Counseled, she voiced understanding   DISCHARGE CONDITIONS:   stable  CONSULTS OBTAINED:  Treatment Team:  Christena Flake, MD  DRUG ALLERGIES:   Allergies  Allergen Reactions  . Penicillins Hives and Swelling    Has patient had a PCN reaction causing immediate rash, facial/tongue/throat swelling, SOB or lightheadedness with hypotension: Yes Has patient had a PCN reaction causing severe rash involving mucus membranes or skin necrosis: No Has patient had a PCN reaction that required hospitalization No Has patient had a PCN reaction occurring within the last 10 years: No If all of the above answers are "NO", then may proceed with Cephalosporin use.  . Tramadol Rash    DISCHARGE MEDICATIONS:   Discharge Medication List as of 11/14/2015  3:33 PM    START taking these medications   Details  docusate sodium (COLACE) 100 MG capsule Take 1 capsule (100 mg total) by mouth 2 (two) times daily., Starting 11/14/2015, Until Discontinued, Normal    oxyCODONE (OXY IR/ROXICODONE) 5 MG immediate release tablet Take 1 tablet (5 mg total) by mouth every 4 (four) hours as needed for  moderate pain, severe pain or breakthrough pain., Starting 11/14/2015, Until Discontinued, Print      CONTINUE these medications which have NOT CHANGED   Details  albuterol (PROVENTIL HFA) 108 (90 Base) MCG/ACT inhaler Inhale 2 puffs into the lungs every 6 (six) hours as  needed. For nausea and/or vomiting., Starting 10/03/2015, Until Discontinued, Historical Med    aspirin EC 81 MG tablet Take 81 mg by mouth daily. , Until Discontinued, Historical Med    dolutegravir (TIVICAY) 50 MG tablet Take 50 mg by mouth daily., Starting 05/16/2015, Until Discontinued, Historical Med    emtricitabine-tenofovir (TRUVADA) 200-300 MG tablet Take 1 tablet by mouth daily., Starting 05/16/2015, Until Discontinued, Historical Med    valACYclovir (VALTREX) 500 MG tablet Take 500 mg by mouth 2 (two) times daily., Starting 10/03/2015, Until Discontinued, Historical Med      STOP taking these medications     naproxen (NAPROSYN) 500 MG tablet      oxyCODONE-acetaminophen (ROXICET) 5-325 MG tablet          DISCHARGE INSTRUCTIONS:    Follow up with ortho surgeon in about 1 week  If you experience worsening of your admission symptoms, develop shortness of breath, life threatening emergency, suicidal or homicidal thoughts you must seek medical attention immediately by calling 911 or calling your MD immediately  if symptoms less severe.  You Must read complete instructions/literature along with all the possible adverse reactions/side effects for all the Medicines you take and that have been prescribed to you. Take any new Medicines after you have completely understood and accept all the possible adverse reactions/side effects.   Please note  You were cared for by a hospitalist during your hospital stay. If you have any questions about your discharge medications or the care you received while you were in the hospital after you are discharged, you can call the unit and asked to speak with the hospitalist on call if the hospitalist that took care of you is not available. Once you are discharged, your primary care physician will handle any further medical issues. Please note that NO REFILLS for any discharge medications will be authorized once you are discharged, as it is imperative  that you return to your primary care physician (or establish a relationship with a primary care physician if you do not have one) for your aftercare needs so that they can reassess your need for medications and monitor your lab values.    Today   CHIEF COMPLAINT:   Chief Complaint  Patient presents with  . Leg Pain    HISTORY OF PRESENT ILLNESS:  Diane Castillo  is a 45 y.o. female with a known history of asthma, hypertension, HIV, who presents to the hospital with complaints of right calf pain. Apparently she felt pain in the right calf . One night prior to admission, she tried to stand from sitting position and fell down due to weakness and pain, in the morning she was unable to walk and presented to the hospital for further evaluation. In emergency room, she was seen by the orthopedic surgeon, do to Concern of compartment syndrome. Dr. bulging. Patient in consultation and felt that it was likely due to medial gastrocnemius injury, which was recommended to be managed nonsurgically, including posterior splint. Patient was admitted to the hospital for IV hydration. Marland Kitchen. Ultrasound of right lower extremity showed no DVT. With conservative therapy patient improved, was felt to be stable to be discharged home. She was evaluated by physical therapy recommended home health services,  crutches. She is to follow up with DR Poggi in about 1 week/ Discussion by problem: #1 Right lower extremity calf pain , likely medial gastrocnemius muscle injury, per orthopedic surgeon dr. Joice Lofts. Continue pain control with  Oxycodone, physical therapy, crutches for ambulation, follow up with DR Poggi in about 1 week #2. Mild rhabdomyolysis, improving on IV fluid administration, follow creatinine as outpatient, likely cocaine related, TSH was normal #3. Hypokalemia, potassium level normalized with therapy #4. Acidosis, resolved on IVF #5. Hyperglycemia, hemoglobin A1c was 5.0, no diabetes #6. Multiple drug abuse including  cannabinoids, opiates and cocaine, which is likely reason for rabdomyolysis and likely fall, discussed with the patient., Counseled, she voiced understanding    VITAL SIGNS:  Blood pressure 152/97, pulse 57, temperature 97.9 F (36.6 C), temperature source Oral, resp. rate 17, height 5\' 5"  (1.651 m), weight 105.098 kg (231 lb 11.2 oz), last menstrual period 11/23/2014, SpO2 99 %.  I/O:   Intake/Output Summary (Last 24 hours) at 11/14/15 2010 Last data filed at 11/14/15 0941  Gross per 24 hour  Intake   1460 ml  Output      0 ml  Net   1460 ml    PHYSICAL EXAMINATION:  GENERAL:  45 y.o.-year-old patient lying in the bed with no acute distress.  EYES: Pupils equal, round, reactive to light and accommodation. No scleral icterus. Extraocular muscles intact.  HEENT: Head atraumatic, normocephalic. Oropharynx and nasopharynx clear.  NECK:  Supple, no jugular venous distention. No thyroid enlargement, no tenderness.  LUNGS: Normal breath sounds bilaterally, no wheezing, rales,rhonchi or crepitation. No use of accessory muscles of respiration.  CARDIOVASCULAR: S1, S2 normal. No murmurs, rubs, or gallops.  ABDOMEN: Soft, non-tender, non-distended. Bowel sounds present. No organomegaly or mass.  EXTREMITIES: No pedal edema, cyanosis, or clubbing. Distal RLE in splint, tender palpation in the calf, no swelling NEUROLOGIC: Cranial nerves II through XII are intact. Muscle strength 5/5 in all extremities. Sensation intact. Gait not checked.  PSYCHIATRIC: The patient is alert and oriented x 3.  SKIN: No obvious rash, lesion, or ulcer.   DATA REVIEW:   CBC  Recent Labs Lab 11/13/15 1150  WBC 6.9  HGB 14.4  HCT 42.8  PLT 190    Chemistries   Recent Labs Lab 11/14/15 0359  NA 137  K 3.7  CL 109  CO2 23  GLUCOSE 109*  BUN 16  CREATININE 0.77  CALCIUM 8.3*    Cardiac Enzymes  Recent Labs Lab 11/13/15 1150  TROPONINI <0.03    Microbiology Results  No results found for  this or any previous visit.  RADIOLOGY:  Dg Tibia/fibula Right  11/13/2015  CLINICAL DATA:  45 year old female with right lower leg pain for the past 1-2 days. EXAM: RIGHT TIBIA AND FIBULA - 2 VIEW COMPARISON:  12/11/2014. FINDINGS: Mild deformity of the distal third of the fibular diaphysis, likely from prior healed fracture. Soft tissue anchor in the proximal tibia, likely from prior ACL repair. No acute displaced fracture of the tibia or fibula. IMPRESSION: 1. No acute radiographic abnormality of the right tibia or fibula. No significant change compared to prior. Electronically Signed   By: Trudie Reed M.D.   On: 11/13/2015 17:31   US Venous Img Lower Unilateral Right  11/13/2015  CLINICAL DATA:  45 year old female with history of right-sided leg pain for 1 day. Prior history of deep venous thrombosis. EXAM: RIGHT LOWER EXTREMITY VENOUS DOPPLER ULTRASOUND TECHNIQUE: Gray-scale sonography with graded compression, as well as color Doppler and  duplex ultrasound were performed to evaluate the lower extremity deep venous systems from the level of the common femoral vein and including the common femoral, femoral, profunda femoral, popliteal and calf veins including the posterior tibial, peroneal and gastrocnemius veins when visible. The superficial great saphenous vein was also interrogated. Spectral Doppler was utilized to evaluate flow at rest and with distal augmentation maneuvers in the common femoral, femoral and popliteal veins. COMPARISON:  07/22/2001. FINDINGS: Contralateral Common Femoral Vein: Respiratory phasicity is normal and symmetric with the symptomatic side. No evidence of thrombus. Normal compressibility. Common Femoral Vein: No evidence of thrombus. Normal compressibility, respiratory phasicity and response to augmentation. Saphenofemoral Junction: No evidence of thrombus. Normal compressibility and flow on color Doppler imaging. Profunda Femoral Vein: No evidence of thrombus. Normal  compressibility and flow on color Doppler imaging. Femoral Vein: No evidence of thrombus. Normal compressibility, respiratory phasicity and response to augmentation. Popliteal Vein: No evidence of thrombus. Normal compressibility, respiratory phasicity and response to augmentation. Calf Veins: No evidence of thrombus. Normal compressibility and flow on color Doppler imaging. Superficial Great Saphenous Vein: No evidence of thrombus. Normal compressibility and flow on color Doppler imaging. Venous Reflux:  None. Other Findings:  None. IMPRESSION: No evidence of right lower extremity deep venous thrombosis. Electronically Signed   By: Trudie Reed M.D.   On: 11/13/2015 13:29   Dg Knee Complete 4 Views Right  11/13/2015  CLINICAL DATA:  45 year old female with right knee and lower leg pain for the past 1-2 days. Prior history of deep venous thrombosis. EXAM: RIGHT KNEE - COMPLETE 4+ VIEW COMPARISON:  12/11/2014. FINDINGS: Four views of the right knee demonstrate no acute displaced fracture, subluxation, dislocation, joint or soft tissue abnormality. Postoperative changes of prior ACL repair are again noted. There is joint space narrowing, subchondral sclerosis, subchondral cyst formation and osteophyte formation, most severe in the lateral and patellofemoral compartments, compatible with osteoarthritis. IMPRESSION: 1. No acute radiographic abnormality of the right knee. 2. Moderate to severe tricompartmental osteoarthritis, as above. 3. Status post ACL repair. Electronically Signed   By: Trudie Reed M.D.   On: 11/13/2015 17:30    EKG:   Orders placed or performed during the hospital encounter of 11/13/15  . EKG 12-Lead  . EKG 12-Lead      Management plans discussed with the patient, family and they are in agreement.  CODE STATUS:     Code Status Orders        Start     Ordered   11/13/15 2021  Full code   Continuous     11/13/15 2020    Code Status History    Date Active Date  Inactive Code Status Order ID Comments User Context   This patient has a current code status but no historical code status.      TOTAL TIME TAKING CARE OF THIS PATIENT: 40 minutes.    Katharina Caper M.D on 11/14/2015 at 8:10 PM  Between 7am to 6pm - Pager - [phone]  After 6pm go to [email or web address] - password [email or web address]  [facility]  Office  [phone]  CC: Primary care physician; Pcp Not In System

## 2015-11-15 DIAGNOSIS — S86111A Strain of other muscle(s) and tendon(s) of posterior muscle group at lower leg level, right leg, initial encounter: Secondary | ICD-10-CM | POA: Insufficient documentation

## 2015-11-16 ENCOUNTER — Encounter: Payer: Self-pay | Admitting: Emergency Medicine

## 2015-11-16 ENCOUNTER — Emergency Department
Admission: EM | Admit: 2015-11-16 | Discharge: 2015-11-16 | Disposition: A | Discharge status: Home / Self Care | Payer: Medicaid Other | Attending: Emergency Medicine | Admitting: Emergency Medicine

## 2015-11-16 DIAGNOSIS — J45909 Unspecified asthma, uncomplicated: Secondary | ICD-10-CM | POA: Diagnosis not present

## 2015-11-16 DIAGNOSIS — I10 Essential (primary) hypertension: Secondary | ICD-10-CM | POA: Insufficient documentation

## 2015-11-16 DIAGNOSIS — Y999 Unspecified external cause status: Secondary | ICD-10-CM | POA: Insufficient documentation

## 2015-11-16 DIAGNOSIS — M79604 Pain in right leg: Secondary | ICD-10-CM | POA: Diagnosis present

## 2015-11-16 DIAGNOSIS — Z79899 Other long term (current) drug therapy: Secondary | ICD-10-CM | POA: Insufficient documentation

## 2015-11-16 DIAGNOSIS — X58XXXA Exposure to other specified factors, initial encounter: Secondary | ICD-10-CM | POA: Insufficient documentation

## 2015-11-16 DIAGNOSIS — F1219 Cannabis abuse with unspecified cannabis-induced disorder: Secondary | ICD-10-CM | POA: Diagnosis not present

## 2015-11-16 DIAGNOSIS — Z87891 Personal history of nicotine dependence: Secondary | ICD-10-CM | POA: Diagnosis not present

## 2015-11-16 DIAGNOSIS — S86811A Strain of other muscle(s) and tendon(s) at lower leg level, right leg, initial encounter: Secondary | ICD-10-CM | POA: Insufficient documentation

## 2015-11-16 DIAGNOSIS — Z7982 Long term (current) use of aspirin: Secondary | ICD-10-CM | POA: Insufficient documentation

## 2015-11-16 DIAGNOSIS — M6282 Rhabdomyolysis: Secondary | ICD-10-CM | POA: Diagnosis not present

## 2015-11-16 DIAGNOSIS — Y939 Activity, unspecified: Secondary | ICD-10-CM | POA: Diagnosis not present

## 2015-11-16 DIAGNOSIS — S86111D Strain of other muscle(s) and tendon(s) of posterior muscle group at lower leg level, right leg, subsequent encounter: Secondary | ICD-10-CM

## 2015-11-16 DIAGNOSIS — B20 Human immunodeficiency virus [HIV] disease: Secondary | ICD-10-CM | POA: Diagnosis not present

## 2015-11-16 DIAGNOSIS — Y929 Unspecified place or not applicable: Secondary | ICD-10-CM | POA: Insufficient documentation

## 2015-11-16 LAB — BASIC METABOLIC PANEL
Anion gap: 6 (ref 5–15)
BUN: 15 mg/dL (ref 6–20)
CALCIUM: 9.2 mg/dL (ref 8.9–10.3)
CO2: 27 mmol/L (ref 22–32)
CREATININE: 0.81 mg/dL (ref 0.44–1.00)
Chloride: 104 mmol/L (ref 101–111)
GFR calc Af Amer: >60 mL/min (ref >60)
Glucose, Bld: 102 mg/dL — ABNORMAL HIGH (ref 65–99)
Potassium: 3.7 mmol/L (ref 3.5–5.1)
SODIUM: 137 mmol/L (ref 135–145)

## 2015-11-16 LAB — CBC WITH DIFFERENTIAL/PLATELET
BASOS ABS: 0 10*3/uL (ref 0–0.1)
EOS ABS: 0.1 10*3/uL (ref 0–0.7)
HCT: 43.6 % (ref 35.0–47.0)
Hemoglobin: 14.3 g/dL (ref 12.0–16.0)
Lymphocytes Relative: 36 %
Lymphs Abs: 1.3 10*3/uL (ref 1.0–3.6)
MCH: 31.6 pg (ref 26.0–34.0)
MCHC: 32.9 g/dL (ref 32.0–36.0)
MCV: 96 fL (ref 80.0–100.0)
Monocytes Absolute: 0.3 10*3/uL (ref 0.2–0.9)
Monocytes Relative: 9 %
Neutro Abs: 1.8 10*3/uL (ref 1.4–6.5)
Neutrophils Relative %: 50 %
PLATELETS: 199 10*3/uL (ref 150–440)
RBC: 4.54 MIL/uL (ref 3.80–5.20)
RDW: 13.4 % (ref 11.5–14.5)
WBC: 3.6 10*3/uL (ref 3.6–11.0)

## 2015-11-16 LAB — URINALYSIS COMPLETE WITH MICROSCOPIC (ARMC ONLY)
BILIRUBIN URINE: NEGATIVE
Bacteria, UA: NONE SEEN
GLUCOSE, UA: NEGATIVE mg/dL
HGB URINE DIPSTICK: NEGATIVE
Ketones, ur: NEGATIVE mg/dL
LEUKOCYTES UA: NEGATIVE
NITRITE: POSITIVE — AB
Protein, ur: 100 mg/dL — AB
Specific Gravity, Urine: 1.026 (ref 1.005–1.030)
pH: 6 (ref 5.0–8.0)

## 2015-11-16 LAB — CK: CK TOTAL: 582 U/L — AB (ref 38–234)

## 2015-11-16 MED ORDER — OXYCODONE-ACETAMINOPHEN 5-325 MG PO TABS
1.0000 | ORAL_TABLET | Freq: Once | ORAL | Status: AC
  Administered 2015-11-16: 1 via ORAL
  Filled 2015-11-16: qty 1

## 2015-11-16 NOTE — ED Notes (Signed)
Pt having R calf pain.  She was admitted on 5/28 for rhabdomylisis and was discharged on 5/29 with pain medication.  Pt back to ED today stating that she cannot take the oxycodone due to itching and she cannot control the pain.

## 2015-11-16 NOTE — ED Provider Notes (Signed)
-----------------------------------------   3:38 PM on 11/16/2015 -----------------------------------------  I have personally seen and evaluated this patient. She is returning complaining of right calf pain. Patient was admitted recently for rhabdomyolysis, with right calf pain. Patient was seen by orthopedics twice during this day, compartment pressures were measured and were within normal limits. Patient's labs appear improved, CK is down trending. Patient continues to stay pain. The patient's main complaint is she attempted to arrange for orthopedic follow-up appointment but they told her she needed to see her primary care doctor first. We will call orthopedics to see if we get the patient in for a follow-up appointment this week. Patient was given pain medication yesterday, which she still has at home. We have taken the splint off the patient, she'll be weightbearing as tolerated with crutches and orthopedic follow-up. On exam the patient has soft With mild to moderate tenderness to palpation. 2+ DP pulse with normal sensation in the leg and foot. Patient does have pain with attempted ambulation. No concern at this time for compartment syndrome. As her CKs downtrending I will the patient to continue to drink lots of fluids, take her pain medication as prescribed, and follow up with orthopedics.  Minna AntisKevin Braylei Totino, MD 11/16/15 1540

## 2015-11-16 NOTE — ED Notes (Signed)
[facility] was [facility] for RLE pain that has not been relieved with oral narcotics. Patient c/o itching with the medication. Patient has not followed up with Dr. Joice LoftsPoggi.

## 2015-11-16 NOTE — Discharge Instructions (Signed)
Use crutches as needed to limit weight bearing on right leg.   Rhabdomyolysis Rhabdomyolysis is a condition that results when muscle cells break down and release substances into the blood that can damage the kidneys. It happens because of damage to the muscles that move bones (skeletal muscle). When you damage this type of muscle, substances inside of your muscle cells are released into your blood. This includes a certain protein called myoglobin. Your kidneys must filter myoglobin from your blood. Large amounts of myoglobin can cause kidney damage or kidney failure. Other substances that are released by muscle cells may upset the balance of the minerals (electrolytes) in your blood. This makes your blood become too acidic (acidosis). CAUSES This condition is caused by muscle damage. Muscle damage often results from:  Extreme overuse of the muscles.  An injury that crushes or compresses a muscle.  Use of illegal drugs, especially cocaine.  Alcohol abuse. Other possible causes include:  Prescription medicines, such as statins, amphetamines, and opiates.  Infections.  Inherited muscle diseases.  High fever.  Heatstroke.  Dehydration.  Seizures.  Surgery. RISK FACTORS This condition is more likely to develop in:  People who have a family history of muscle disease.  People who participate in extreme sports, such as marathon running.  People who have diabetes.  Older people.  People who abuse drugs or alcohol. SYMPTOMS Symptoms of this condition vary. Some people have very few symptoms, while others have many symptoms. The most common symptoms include:  Muscle pain and swelling.  Muscle weakness.  Dark urine.  Feeling weak and tired. Other symptoms include:  Nausea and vomiting.  Fever.  Pain in the abdomen.  Joint pain. Signs and symptoms of complications from rhabdomyolysis may include:  Heart rhythm abnormalities (arrhythmias).  Seizures.  Reduced  urine production because of kidney failure.  Very low blood pressure (shock).  Uncontrolled bleeding. DIAGNOSIS This condition may be diagnosed based on:  Your symptoms and medical history.  A physical exam.  Blood tests to check for:  Muscle breakdown products in the blood (creatine kinase).  Myoglobin.  Acidosis.  Electrolyte imbalances.  Urine tests to check for myoglobin. You may also have other tests to check for causes of muscle damage and to check for complications. TREATMENT Treatment for this condition focuses on keeping up your fluid level, reversing acidosis, and protecting your kidneys. Treatment may include:  Fluids and medicines given through an IV tube that is inserted into one of your veins.  Medicines, such as:  Sodium bicarbonate to reduce acidosis.  Electrolytes to restore the balance of these minerals in your body.  Hemodialysis. This treatment uses an artificial kidney machine to filter your blood while you recover. You may have this if other treatments are not helping. HOME CARE INSTRUCTIONS  Take medicines only as directed by your health care provider.  Rest at home until your health care provider says that you can return to your normal activities.  Drink enough fluid to keep your urine clear or pale yellow.  Do not exercise with great energy and effort (strenuously). Ask your health care provider what level of exercise is safe for you.  Do not abuse drugs or alcohol. If you are struggling with drug or alcohol use, ask your health care provider for help.  Keep all follow-up visits as directed by your health care provider. This is important. SEEK MEDICAL CARE IF:  You develop symptoms of rhabdomyolysis at home after treatment. SEEK IMMEDIATE MEDICAL CARE IF:  You  have a seizure.  You bleed easily or cannot control bleeding.  You cannot make urine.  You have chest pain.  You have trouble breathing.   This information is not intended  to replace advice given to you by your health care provider. Make sure you discuss any questions you have with your health care provider.   Document Released: 05/17/2004 Document Revised: 10/19/2014 Document Reviewed: 06/09/2014 Elsevier Interactive Patient Education [employer]

## 2015-11-16 NOTE — ED Provider Notes (Signed)
[facility] Emergency Department Provider Note  ____________________________________________  Time seen: Approximately 2:13 PM  I have reviewed the triage vital signs and the nursing notes.   HISTORY  Chief Complaint Leg Pain    HPI Diane Castillo is a 45 y.o. female, NAD, who presents to the emergency department with worsening right lower leg pain. She was admitted to [facility] for acute rhabdomyolysis on 11/13/15 and discharged on 11/14/15 with instructions to follow up with Dr. Joice Lofts in orthopedics. She has not been able to follow up with Dr. Joice Lofts due to [facility] paperwork and referral issues. She claims worsening pain that has spread to include her right knee and the top of her foot, swelling, tightness, and cramping. She has been following discharge PT instructions, but has stopped taking the Oxycodone because it makes her feel itchy. She denies fever, chills, numbness, tingling, changes in temperature, or loss of ROM in her right extremity. Has not noted any skin sores, open wounds, oozing or weeping.   Past Medical History  Diagnosis Date  . Asthma   . Hypertension   . HIV disease [facility])     Patient Active Problem List   Diagnosis Date Noted  . Right leg pain 11/14/2015  . Gastrocnemius muscle strain 11/14/2015  . Hypokalemia 11/14/2015  . Metabolic acidosis 11/14/2015  . Hyperglycemia 11/14/2015  . Drug abuse 11/14/2015  . Rhabdomyolysis 11/13/2015    Past Surgical History  Procedure Laterality Date  . Knee surgery    . Hand surgery    . Abdominal hysterectomy    . Hernia repair      Current Outpatient Rx  Name  Route  Sig  Dispense  Refill  . albuterol (PROVENTIL HFA) 108 (90 Base) MCG/ACT inhaler   Inhalation   Inhale 2 puffs into the lungs every 6 (six) hours as needed. For nausea and/or vomiting.         Marland Kitchen aspirin EC 81 MG tablet   Oral   Take 81 mg by mouth daily.          Marland Kitchen docusate sodium (COLACE) 100 MG capsule   Oral   Take  1 capsule (100 mg total) by mouth 2 (two) times daily.   10 capsule   0   . dolutegravir (TIVICAY) 50 MG tablet   Oral   Take 50 mg by mouth daily.         Marland Kitchen emtricitabine-tenofovir (TRUVADA) 200-300 MG tablet   Oral   Take 1 tablet by mouth daily.         Marland Kitchen oxyCODONE (OXY IR/ROXICODONE) 5 MG immediate release tablet   Oral   Take 1 tablet (5 mg total) by mouth every 4 (four) hours as needed for moderate pain, severe pain or breakthrough pain.   30 tablet   0   . valACYclovir (VALTREX) 500 MG tablet   Oral   Take 500 mg by mouth 2 (two) times daily.           Allergies Penicillins and Tramadol  Family History  Problem Relation Age of Onset  . Heart disease Mother     Social History Social History  Substance Use Topics  . Smoking status: Former Smoker -- 0.50 packs/day    Types: Cigarettes    Quit date: 11/13/2015  . Smokeless tobacco: Current User  . Alcohol Use: No     Comment: socially     Review of Systems  Constitutional: No fever/chills, fatigue ENT: No sore throat. Cardiovascular: No chest  pain. Respiratory: No cough. No shortness of breath. No wheezing.  Gastrointestinal: No abdominal pain.  No nausea, vomiting.   Musculoskeletal: Positive right calf pain. Negative for back pain.  Skin: Positive swelling right calf. Negative for rash, skin sores, open wounds, oozing or weeping. Neurological: Negative for headaches, focal weakness or numbness. No tingling 10-point ROS otherwise negative.  ____________________________________________   PHYSICAL EXAM:  VITAL SIGNS: ED Triage Vitals  Enc Vitals Group     BP 11/16/15 1312 129/99 mmHg     Pulse Rate 11/16/15 1312 65     Resp 11/16/15 1312 20     Temp 11/16/15 1312 98.3 F (36.8 C)     Temp Source 11/16/15 1312 Oral     SpO2 11/16/15 1312 100 %     Weight 11/16/15 1312 203 lb (92.08 kg)     Height 11/16/15 1312 5\' 5"  (1.651 m)     Head Cir --      [place] Flow --      Pain Score 11/16/15  1312 9     Pain Loc --      Pain Edu? --      Excl. in GC? --      Constitutional: Alert and oriented. Well appearing and in no acute distress. Eyes: Conjunctivae are normal.  Head: Atraumatic. Cardiovascular:  Good peripheral circulation with 2+ pulses in the right lower extremity. Respiratory: Normal respiratory effort without tachypnea or retractions.  Musculoskeletal: Improved ROM of the right ankle, foot, toes as compared to note in the initial visit. No compartment tightening on palpation of the right calf. No muscular deformities about the right calf. Tenderness to palpation diffusely about the right calf. No abnormal warmth about the right calf. No lower extremity edema.  No joint effusions. Neurologic:  Normal speech and language. No gross focal neurologic deficits are appreciated.  Skin:  Skin is warm, dry and intact. No rash noted. Psychiatric: Mood and affect are normal. Speech and behavior are normal. Patient exhibits appropriate insight and judgement.   ____________________________________________   LABS (all labs ordered are listed, but only abnormal results are displayed)  Labs Reviewed  BASIC METABOLIC PANEL - Abnormal; Notable for the following:    Glucose, Bld 102 (*)    All other components within normal limits  CK - Abnormal; Notable for the following:    Total CK 582 (*)    All other components within normal limits  URINALYSIS COMPLETEWITH MICROSCOPIC ([facility] ONLY) - Abnormal; Notable for the following:    Color, Urine YELLOW (*)    APPearance HAZY (*)    Protein, ur 100 (*)    Nitrite POSITIVE (*)    Squamous Epithelial / LPF 6-30 (*)    All other components within normal limits  CBC WITH DIFFERENTIAL/PLATELET   ____________________________________________  EKG  None ____________________________________________  RADIOLOGY  None ____________________________________________    PROCEDURES  Procedure(s) performed: None    Medications   oxyCODONE-acetaminophen (PERCOCET/ROXICET) 5-325 MG per tablet 1 tablet (1 tablet Oral Given 11/16/15 1419)     ____________________________________________   INITIAL IMPRESSION / ASSESSMENT AND PLAN / ED COURSE  Pertinent lab results that were available during my care of the patient were reviewed by me and considered in my medical decision making (see chart for details).  Patient's history, physical exam and lab results were discussed with Dr. Lenard Lance who in turn and examined and conversed with the patient in person.  Patient's diagnosis is consistent with nontraumatic rhabdomyolysis that is improving, right leg pain  and gastrocnemius muscle strain on the right. Patient will be discharged home with instructions to continue the oxycodone in which she was prescribed on May 29. She may add a 325 mg Tylenol to that medication for better pain control. Patient is to follow up with Dr. Joice LoftsPoggi in orthopedics on 11/28/2015 as currently scheduled. Patient may follow-up with her primary care provider if she needs any further assistance with pain medication refills prior seeing Dr. Joice LoftsPoggi in follow-up. Patient is given ED precautions to return to the ED for any worsening or new symptoms.    ____________________________________________  FINAL CLINICAL IMPRESSION(S) / ED DIAGNOSES  Final diagnoses:  Non-traumatic rhabdomyolysis  Right leg pain  Gastrocnemius muscle strain, right, subsequent encounter      NEW MEDICATIONS STARTED DURING THIS VISIT:  New Prescriptions   No medications on file         Hope PigeonJami L Chabeli Barsamian, PA-C 11/16/15 1554  Minna AntisKevin Paduchowski, MD 11/16/15 2028

## 2016-01-02 ENCOUNTER — Other Ambulatory Visit: Payer: Self-pay | Admitting: Orthopedic Surgery

## 2016-01-02 DIAGNOSIS — M2341 Loose body in knee, right knee: Secondary | ICD-10-CM

## 2016-01-17 ENCOUNTER — Ambulatory Visit: Payer: Medicaid Other

## 2016-01-27 ENCOUNTER — Encounter: Payer: Self-pay | Admitting: Emergency Medicine

## 2016-01-27 ENCOUNTER — Emergency Department
Admission: EM | Admit: 2016-01-27 | Discharge: 2016-01-27 | Disposition: A | Discharge status: Home / Self Care | Payer: Medicaid Other | Attending: Emergency Medicine | Admitting: Emergency Medicine

## 2016-01-27 DIAGNOSIS — Z79899 Other long term (current) drug therapy: Secondary | ICD-10-CM | POA: Diagnosis not present

## 2016-01-27 DIAGNOSIS — I10 Essential (primary) hypertension: Secondary | ICD-10-CM | POA: Insufficient documentation

## 2016-01-27 DIAGNOSIS — F17200 Nicotine dependence, unspecified, uncomplicated: Secondary | ICD-10-CM | POA: Diagnosis not present

## 2016-01-27 DIAGNOSIS — J45909 Unspecified asthma, uncomplicated: Secondary | ICD-10-CM | POA: Diagnosis not present

## 2016-01-27 DIAGNOSIS — M25561 Pain in right knee: Secondary | ICD-10-CM | POA: Insufficient documentation

## 2016-01-27 DIAGNOSIS — G8929 Other chronic pain: Secondary | ICD-10-CM | POA: Diagnosis not present

## 2016-01-27 DIAGNOSIS — Z7982 Long term (current) use of aspirin: Secondary | ICD-10-CM | POA: Diagnosis not present

## 2016-01-27 DIAGNOSIS — Z21 Asymptomatic human immunodeficiency virus [HIV] infection status: Secondary | ICD-10-CM | POA: Diagnosis not present

## 2016-01-27 DIAGNOSIS — F1290 Cannabis use, unspecified, uncomplicated: Secondary | ICD-10-CM | POA: Diagnosis not present

## 2016-01-27 MED ORDER — OXYCODONE-ACETAMINOPHEN 5-325 MG PO TABS
1.0000 | ORAL_TABLET | Freq: Once | ORAL | Status: AC
  Administered 2016-01-27: 1 via ORAL
  Filled 2016-01-27: qty 1

## 2016-01-27 MED ORDER — MELOXICAM 15 MG PO TABS: 15.0000 mg | ORAL_TABLET | Freq: Every day | ORAL | 0 refills | Status: DC

## 2016-01-27 NOTE — ED Triage Notes (Signed)
Pt has chronic R knee pain that is presently exacerbated. Pt states needs a knee replacement and is followed in [facility] for this.

## 2016-01-27 NOTE — ED Provider Notes (Signed)
[facility] Emergency Department Provider Note  ____________________________________________  Time seen: Approximately 9:46 PM  I have reviewed the triage vital signs and the nursing notes.   HISTORY  Chief Complaint Knee Pain    HPI Diane Castillo is a 45 y.o. female who presents emergency department complaining of right knee pain. Patient states that she has had chronic knee pain for several years. Patient had an injury where she fell into a manhole and tore her anterior cruciate ligament, both meniscus, and LCL. Patient reports that she has had multiple surgeries on her knees and that she now has severe osteoarthritis. Patient reports that she is supposed to have surgery in October but her pain increased this evening. She states that she called her orthopedic surgeon but they were closed. Patient denies any other complaints. No recent injury to this knee. No numbness or tingling distal to knee. Pain is sharp, worse with ambulation, intermittent nature.   Past Medical History:  Diagnosis Date  . Asthma   . HIV disease (HCC)   . Hypertension     Patient Active Problem List   Diagnosis Date Noted  . Right leg pain 11/14/2015  . Gastrocnemius muscle strain 11/14/2015  . Hypokalemia 11/14/2015  . Metabolic acidosis 11/14/2015  . Hyperglycemia 11/14/2015  . Drug abuse 11/14/2015  . Rhabdomyolysis 11/13/2015    Past Surgical History:  Procedure Laterality Date  . ABDOMINAL HYSTERECTOMY    . HAND SURGERY    . HERNIA REPAIR    . KNEE SURGERY      Prior to Admission medications   Medication Sig Start Date End Date Taking? Authorizing Provider  albuterol (PROVENTIL HFA) 108 (90 Base) MCG/ACT inhaler Inhale 2 puffs into the lungs every 6 (six) hours as needed. For nausea and/or vomiting. 10/03/15   Historical Provider, MD  aspirin EC 81 MG tablet Take 81 mg by mouth daily.     Historical Provider, MD  docusate sodium (COLACE) 100 MG capsule Take 1  capsule (100 mg total) by mouth 2 (two) times daily. 11/14/15   Katharina Caperima Vaickute, MD  dolutegravir (TIVICAY) 50 MG tablet Take 50 mg by mouth daily. 05/16/15   Historical Provider, MD  emtricitabine-tenofovir (TRUVADA) 200-300 MG tablet Take 1 tablet by mouth daily. 05/16/15   Historical Provider, MD  meloxicam (MOBIC) 15 MG tablet Take 1 tablet (15 mg total) by mouth daily. 01/27/16   Delorise RoyalsJonathan D Kylani Wires, PA-C  oxyCODONE (OXY IR/ROXICODONE) 5 MG immediate release tablet Take 1 tablet (5 mg total) by mouth every 4 (four) hours as needed for moderate pain, severe pain or breakthrough pain. 11/14/15   Katharina Caperima Vaickute, MD  valACYclovir (VALTREX) 500 MG tablet Take 500 mg by mouth 2 (two) times daily. 10/03/15   Historical Provider, MD    Allergies Penicillins and Tramadol  Family History  Problem Relation Age of Onset  . Heart disease Mother     Social History Social History  Substance Use Topics  . Smoking status: Former Smoker    Packs/day: 0.50    Types: Cigarettes    Quit date: 11/13/2015  . Smokeless tobacco: Current User  . Alcohol use No     Comment: socially     Review of Systems  Constitutional: No fever/chills Cardiovascular: no chest pain. Respiratory: no cough. No SOB. Musculoskeletal: Positive for right knee pain Skin: Negative for rash, abrasions, lacerations, ecchymosis. Neurological: Negative for headaches, focal weakness or numbness. 10-point ROS otherwise negative.  ____________________________________________   PHYSICAL EXAM:  VITAL SIGNS:  ED Triage Vitals  Enc Vitals Group     BP 01/27/16 2115 120/74     Pulse Rate 01/27/16 2115 80     Resp 01/27/16 2115 16     Temp 01/27/16 2115 98.7 F (37.1 C)     Temp Source 01/27/16 2115 Oral     SpO2 01/27/16 2115 97 %     Weight 01/27/16 2116 185 lb (83.9 kg)     Height 01/27/16 2116  (1.651 m)     Head Circumference --      Peak Flow --      Pain Score --      Pain Loc --      Pain Edu? --      Excl. in  GC? --      Constitutional: Alert and oriented. Well appearing and in no acute distress. Eyes: Conjunctivae are normal. PERRL. EOMI. Head: Atraumatic. Cardiovascular: Normal rate, regular rhythm. Normal S1 and S2.  Good peripheral circulation. Respiratory: Normal respiratory effort without tachypnea or retractions. Lungs CTAB. Good air entry to the bases with no decreased or absent breath sounds. Musculoskeletal: Full range of motion to all extremities. No gross deformities appreciated. Patient has multiple scars from previous surgeries to right knee. Full range of motion right knee. Patient is mildly tender palpation over bilateral anterior joint lines. No palpable abnormality. Varus, valgus, Lachman's is unremarkable. Pulses and sensation are intact distally Neurologic:  Normal speech and language. No gross focal neurologic deficits are appreciated.  Skin:  Skin is warm, dry and intact. No rash noted. Psychiatric: Mood and affect are normal. Speech and behavior are normal. Patient exhibits appropriate insight and judgement.   ____________________________________________   LABS (all labs ordered are listed, but only abnormal results are displayed)  Labs Reviewed - No data to display ____________________________________________  EKG   ____________________________________________  RADIOLOGY   No results found.  ____________________________________________    PROCEDURES  Procedure(s) performed:    Procedures    Medications  oxyCODONE-acetaminophen (PERCOCET/ROXICET) 5-325 MG per tablet 1 tablet (not administered)     ____________________________________________   INITIAL IMPRESSION / ASSESSMENT AND PLAN / ED COURSE  Pertinent labs & imaging results that were available during my care of the patient were reviewed by me and considered in my medical decision making (see chart for details).  Clinical Course    Patient's diagnosis is consistent with Right knee  pain. This is chronic in nature and patient states that it has increased in severity today. Patient tried to contact orthopedics for prescription for pain medication and office was already closed. Patient is agreeable with taking 1 Percocet here in the emergency department and being placed on anti-inflammatories until she can call orthopedics on Monday morning.. Patient will be discharged home with prescriptions for anti-inflammatories for symptom control. Patient is to follow up with orthopedics in 3 days. Patient is given ED precautions to return to the ED for any worsening or new symptoms.     ____________________________________________  FINAL CLINICAL IMPRESSION(S) / ED DIAGNOSES  Final diagnoses:  Chronic knee pain, right      NEW MEDICATIONS STARTED DURING THIS VISIT:  New Prescriptions   MELOXICAM (MOBIC) 15 MG TABLET    Take 1 tablet (15 mg total) by mouth daily.        This chart was dictated using voice recognition software/Dragon. Despite best efforts to proofread, errors can occur which can change the meaning. Any change was purely unintentional.    Racheal Patches, PA-C 01/27/16  2206    Myrna Blazer, MD 01/27/16 [phone]

## 2016-02-07 ENCOUNTER — Emergency Department
Admission: EM | Admit: 2016-02-07 | Discharge: 2016-02-07 | Disposition: A | Discharge status: Home / Self Care | Payer: Medicaid Other | Attending: Emergency Medicine | Admitting: Emergency Medicine

## 2016-02-07 ENCOUNTER — Emergency Department: Payer: Medicaid Other

## 2016-02-07 ENCOUNTER — Ambulatory Visit: Payer: Medicaid Other

## 2016-02-07 ENCOUNTER — Encounter: Payer: Self-pay | Admitting: Emergency Medicine

## 2016-02-07 DIAGNOSIS — Y929 Unspecified place or not applicable: Secondary | ICD-10-CM | POA: Diagnosis not present

## 2016-02-07 DIAGNOSIS — I10 Essential (primary) hypertension: Secondary | ICD-10-CM | POA: Diagnosis not present

## 2016-02-07 DIAGNOSIS — Y999 Unspecified external cause status: Secondary | ICD-10-CM | POA: Insufficient documentation

## 2016-02-07 DIAGNOSIS — Z87891 Personal history of nicotine dependence: Secondary | ICD-10-CM | POA: Diagnosis not present

## 2016-02-07 DIAGNOSIS — W1839XA Other fall on same level, initial encounter: Secondary | ICD-10-CM | POA: Insufficient documentation

## 2016-02-07 DIAGNOSIS — M25561 Pain in right knee: Secondary | ICD-10-CM

## 2016-02-07 DIAGNOSIS — Z21 Asymptomatic human immunodeficiency virus [HIV] infection status: Secondary | ICD-10-CM | POA: Insufficient documentation

## 2016-02-07 DIAGNOSIS — J45909 Unspecified asthma, uncomplicated: Secondary | ICD-10-CM | POA: Insufficient documentation

## 2016-02-07 DIAGNOSIS — Y9389 Activity, other specified: Secondary | ICD-10-CM | POA: Insufficient documentation

## 2016-02-07 DIAGNOSIS — S8001XA Contusion of right knee, initial encounter: Secondary | ICD-10-CM | POA: Diagnosis not present

## 2016-02-07 DIAGNOSIS — S8991XA Unspecified injury of right lower leg, initial encounter: Secondary | ICD-10-CM | POA: Diagnosis present

## 2016-02-07 DIAGNOSIS — Z7982 Long term (current) use of aspirin: Secondary | ICD-10-CM | POA: Insufficient documentation

## 2016-02-07 MED ORDER — HYDROMORPHONE HCL 1 MG/ML IJ SOLN
1.0000 mg | Freq: Once | INTRAMUSCULAR | Status: AC
  Administered 2016-02-07: 1 mg via INTRAVENOUS
  Filled 2016-02-07: qty 1

## 2016-02-07 MED ORDER — ONDANSETRON 4 MG PO TBDP
4.0000 mg | ORAL_TABLET | Freq: Once | ORAL | Status: AC
  Administered 2016-02-07: 4 mg via ORAL
  Filled 2016-02-07: qty 1

## 2016-02-07 MED ORDER — OXYCODONE-ACETAMINOPHEN 5-325 MG PO TABS: 1.0000 | ORAL_TABLET | ORAL | 0 refills | Status: DC | PRN

## 2016-02-07 NOTE — ED Notes (Signed)
States she fell this am  Landed on right knee  Swelling noted to upper lip

## 2016-02-07 NOTE — ED Provider Notes (Signed)
[facility] Emergency Department Provider Note  ____________________________________________  Time seen: Approximately 4:07 PM  I have reviewed the triage vital signs and the nursing notes.   HISTORY  Chief Complaint Knee Pain    HPI Diane Castillo is a 45 y.o. female presents for evaluation of right knee pain. Patient reports that she fell on her knee prior to arrival. Has a past medical history of an anterior cruciate ligament tear and is awaiting surgery in September.   Past Medical History:  Diagnosis Date  . Asthma   . HIV disease (HCC)   . Hypertension     Patient Active Problem List   Diagnosis Date Noted  . Right leg pain 11/14/2015  . Gastrocnemius muscle strain 11/14/2015  . Hypokalemia 11/14/2015  . Metabolic acidosis 11/14/2015  . Hyperglycemia 11/14/2015  . Drug abuse 11/14/2015  . Rhabdomyolysis 11/13/2015    Past Surgical History:  Procedure Laterality Date  . ABDOMINAL HYSTERECTOMY    . HAND SURGERY    . HERNIA REPAIR    . KNEE SURGERY      Prior to Admission medications   Medication Sig Start Date End Date Taking? Authorizing Provider  albuterol (PROVENTIL HFA) 108 (90 Base) MCG/ACT inhaler Inhale 2 puffs into the lungs every 6 (six) hours as needed. For nausea and/or vomiting. 10/03/15   Historical Provider, MD  aspirin EC 81 MG tablet Take 81 mg by mouth daily.     Historical Provider, MD  docusate sodium (COLACE) 100 MG capsule Take 1 capsule (100 mg total) by mouth 2 (two) times daily. 11/14/15   Katharina Caperima Vaickute, MD  dolutegravir (TIVICAY) 50 MG tablet Take 50 mg by mouth daily. 05/16/15   Historical Provider, MD  emtricitabine-tenofovir (TRUVADA) 200-300 MG tablet Take 1 tablet by mouth daily. 05/16/15   Historical Provider, MD  meloxicam (MOBIC) 15 MG tablet Take 1 tablet (15 mg total) by mouth daily. 01/27/16   Delorise RoyalsJonathan D Cuthriell, PA-C  oxyCODONE (OXY IR/ROXICODONE) 5 MG immediate release tablet Take 1 tablet (5 mg  total) by mouth every 4 (four) hours as needed for moderate pain, severe pain or breakthrough pain. 11/14/15   Katharina Caperima Vaickute, MD  oxyCODONE-acetaminophen (ROXICET) 5-325 MG tablet Take 1-2 tablets by mouth every 4 (four) hours as needed for severe pain. 02/07/16   Charmayne Sheerharles M Beers, PA-C  valACYclovir (VALTREX) 500 MG tablet Take 500 mg by mouth 2 (two) times daily. 10/03/15   Historical Provider, MD    Allergies Penicillins and Tramadol  Family History  Problem Relation Age of Onset  . Heart disease Mother     Social History Social History  Substance Use Topics  . Smoking status: Former Smoker    Packs/day: 0.50    Types: Cigarettes    Quit date: 11/13/2015  . Smokeless tobacco: Current User  . Alcohol use No     Comment: socially    Review of Systems Constitutional: No fever/chills Musculoskeletal: positive for right knee pain. Skin: Negative for rash. Neurological: Negative for headaches, focal weakness or numbness.  10-point ROS otherwise negative.  ____________________________________________   PHYSICAL EXAM:  VITAL SIGNS: ED Triage Vitals [02/07/16 1504]  Enc Vitals Group     BP (!) 152/99     Pulse Rate 70     Resp 20     Temp 98.1 F (36.7 C)     Temp Source Oral     SpO2 100 %     Weight 185 lb (83.9 kg)  Height 5\' 5"  (1.651 m)     Head Circumference      Peak Flow      Pain Score 9     Pain Loc      Pain Edu?      Excl. in GC?     Constitutional: Alert and oriented. Well appearing and in no acute distress.  Cardiovascular: Normal rate, regular rhythm. Grossly normal heart sounds.  Good peripheral circulation. Respiratory: Normal respiratory effort.  No retractions. Lungs CTAB. Musculoskeletal: positive for right knee tenderness pain and swelling. Positive warmth. Neurologic:  Normal speech and language. No gross focal neurologic deficits are appreciated. No gait instability. Skin:  Skin is warm, dry and intact. No rash noted. Psychiatric: Mood  and affect are normal. Speech and behavior are normal.  ____________________________________________   LABS (all labs ordered are listed, but only abnormal results are displayed)  Labs Reviewed - No data to display ____________________________________________  EKG   ____________________________________________  RADIOLOGY  IMPRESSION:  Prior ACL surgery. Extensive osteoarthritis in all 3 compartments  with large loose body in the joint. Negative for fracture.    ____________________________________________   PROCEDURES  Procedure(s) performed: None  Critical Care performed: No  ____________________________________________   INITIAL IMPRESSION / ASSESSMENT AND PLAN / ED COURSE  Pertinent labs & imaging results that were available during my care of the patient were reviewed by me and considered in my medical decision making (see chart for details). Review of the [place] CSRS was performed in accordance of the [facility] prior to dispensing any controlled drugs.  Acute right knee contusion with chronic right knee changes. Patient follow-up with orthopedic doctor scheduled in September Rx given for Percocet 5/325 for many pain she voices no other emergency complaints at this time  Clinical Course    ____________________________________________   FINAL CLINICAL IMPRESSION(S) / ED DIAGNOSES  Final diagnoses:  Right knee pain  Knee contusion, right, initial encounter     This chart was dictated using voice recognition software/Dragon. Despite best efforts to proofread, errors can occur which can change the meaning. Any change was purely unintentional.    Evangeline Dakinharles M Beers, PA-C 02/07/16 1635    Loleta Roseory Forbach, MD 02/07/16 [phone]

## 2016-02-07 NOTE — ED Triage Notes (Signed)
Pt reports right chronic knee pain. Pt reports she fell carrying groceries into the house today and pain has increased. Pt also reports hitting mouth on ground, swelling noted to top and bottom lips. Pt airway in tact, no distress noted.

## 2016-02-27 ENCOUNTER — Ambulatory Visit
Admission: RE | Admit: 2016-02-27 | Discharge: 2016-02-27 | Disposition: A | Discharge status: Home / Self Care | Payer: Medicaid Other | Source: Ambulatory Visit | Attending: Orthopedic Surgery | Admitting: Orthopedic Surgery

## 2016-02-27 DIAGNOSIS — M65861 Other synovitis and tenosynovitis, right lower leg: Secondary | ICD-10-CM | POA: Diagnosis not present

## 2016-02-27 DIAGNOSIS — M25461 Effusion, right knee: Secondary | ICD-10-CM | POA: Insufficient documentation

## 2016-02-27 DIAGNOSIS — M2341 Loose body in knee, right knee: Secondary | ICD-10-CM | POA: Insufficient documentation

## 2016-02-27 DIAGNOSIS — M25761 Osteophyte, right knee: Secondary | ICD-10-CM | POA: Insufficient documentation

## 2016-02-27 DIAGNOSIS — M1711 Unilateral primary osteoarthritis, right knee: Secondary | ICD-10-CM | POA: Insufficient documentation

## 2016-02-28 ENCOUNTER — Emergency Department
Admission: EM | Admit: 2016-02-28 | Discharge: 2016-02-28 | Disposition: A | Discharge status: Home / Self Care | Payer: Medicaid Other | Attending: Emergency Medicine | Admitting: Emergency Medicine

## 2016-02-28 ENCOUNTER — Encounter: Payer: Self-pay | Admitting: Emergency Medicine

## 2016-02-28 DIAGNOSIS — Z7982 Long term (current) use of aspirin: Secondary | ICD-10-CM | POA: Diagnosis not present

## 2016-02-28 DIAGNOSIS — G8929 Other chronic pain: Secondary | ICD-10-CM

## 2016-02-28 DIAGNOSIS — B20 Human immunodeficiency virus [HIV] disease: Secondary | ICD-10-CM | POA: Diagnosis not present

## 2016-02-28 DIAGNOSIS — I10 Essential (primary) hypertension: Secondary | ICD-10-CM | POA: Insufficient documentation

## 2016-02-28 DIAGNOSIS — J45909 Unspecified asthma, uncomplicated: Secondary | ICD-10-CM | POA: Insufficient documentation

## 2016-02-28 DIAGNOSIS — M25561 Pain in right knee: Secondary | ICD-10-CM

## 2016-02-28 DIAGNOSIS — Z87891 Personal history of nicotine dependence: Secondary | ICD-10-CM | POA: Diagnosis not present

## 2016-02-28 DIAGNOSIS — M1731 Unilateral post-traumatic osteoarthritis, right knee: Secondary | ICD-10-CM | POA: Diagnosis not present

## 2016-02-28 MED ORDER — GABAPENTIN 300 MG PO CAPS: 300.0000 mg | ORAL_CAPSULE | Freq: Three times a day (TID) | ORAL | 0 refills | Status: DC

## 2016-02-28 MED ORDER — CYCLOBENZAPRINE HCL 5 MG PO TABS: 5.0000 mg | ORAL_TABLET | Freq: Three times a day (TID) | ORAL | 0 refills | Status: DC | PRN

## 2016-02-28 MED ORDER — GABAPENTIN 600 MG PO TABS
300.0000 mg | ORAL_TABLET | Freq: Once | ORAL | Status: AC
  Administered 2016-02-28: 300 mg via ORAL
  Filled 2016-02-28: qty 2

## 2016-02-28 MED ORDER — CYCLOBENZAPRINE HCL 10 MG PO TABS
10.0000 mg | ORAL_TABLET | Freq: Once | ORAL | Status: AC
  Administered 2016-02-28: 10 mg via ORAL
  Filled 2016-02-28: qty 1

## 2016-02-28 MED ORDER — TRAMADOL HCL 50 MG PO TABS: 100.0000 mg | ORAL_TABLET | Freq: Three times a day (TID) | ORAL | 0 refills | Status: DC | PRN

## 2016-02-28 MED ORDER — TRAMADOL HCL 50 MG PO TABS
100.0000 mg | ORAL_TABLET | Freq: Once | ORAL | Status: AC
  Administered 2016-02-28: 100 mg via ORAL
  Filled 2016-02-28: qty 2

## 2016-02-28 NOTE — Discharge Instructions (Signed)
Take the pain medicine as needed. Take muscle relaxant as needed. Take the Gabapentin on schedule. Follow-up with [facility] for surgical referral.

## 2016-02-28 NOTE — ED Provider Notes (Signed)
[facility] Emergency Department Provider Note ____________________________________________  Time seen: 1557  I have reviewed the triage vital signs and the nursing notes.  HISTORY  Chief Complaint  Knee Pain  HPI Diane Castillo is a 45 y.o. female presents to the ED one day status post an MRI for chronic knee pain. The patient has a history of osteoarthritis and atraumatic anterior cruciate ligament injury with graft in 2013. Since that time she's had increasing knee pain with underlying dental joint disease. Her MRI showedchronic changes and she is in the process of being referred to orthopedic surgery for a total knee arthroplasty. She presents today with request for pain management. She denies any recent injury, accident, trauma. She was seen by orthopedic provider this morning, but was not discharged with any particular medicines. She's been on multiple medications in the past but currently has no prescriptions other than Mobic. She verbalizes a drug sensitivity to tramadol, but notes she can take Ultram without difficulty.  Past Medical History:  Diagnosis Date  . Asthma   . HIV disease (HCC)   . Hypertension     Patient Active Problem List   Diagnosis Date Noted  . Right leg pain 11/14/2015  . Gastrocnemius muscle strain 11/14/2015  . Hypokalemia 11/14/2015  . Metabolic acidosis 11/14/2015  . Hyperglycemia 11/14/2015  . Drug abuse 11/14/2015  . Rhabdomyolysis 11/13/2015    Past Surgical History:  Procedure Laterality Date  . ABDOMINAL HYSTERECTOMY    . HAND SURGERY    . HERNIA REPAIR    . KNEE SURGERY      Prior to Admission medications   Medication Sig Start Date End Date Taking? Authorizing Provider  albuterol (PROVENTIL HFA) 108 (90 Base) MCG/ACT inhaler Inhale 2 puffs into the lungs every 6 (six) hours as needed. For nausea and/or vomiting. 10/03/15   Historical Provider, MD  aspirin EC 81 MG tablet Take 81 mg by mouth daily.     Historical  Provider, MD  cyclobenzaprine (FLEXERIL) 5 MG tablet Take 1 tablet (5 mg total) by mouth 3 (three) times daily as needed for muscle spasms. 02/28/16   Nicolas Sisler V Bacon Cathleen Yagi, PA-C  docusate sodium (COLACE) 100 MG capsule Take 1 capsule (100 mg total) by mouth 2 (two) times daily. 11/14/15   Katharina Caper, MD  dolutegravir (TIVICAY) 50 MG tablet Take 50 mg by mouth daily. 05/16/15   Historical Provider, MD  emtricitabine-tenofovir (TRUVADA) 200-300 MG tablet Take 1 tablet by mouth daily. 05/16/15   Historical Provider, MD  gabapentin (NEURONTIN) 300 MG capsule Take 1 capsule (300 mg total) by mouth 3 (three) times daily. 02/28/16 02/27/17  Madisyn Mawhinney V Bacon Camrin Lapre, PA-C  meloxicam (MOBIC) 15 MG tablet Take 1 tablet (15 mg total) by mouth daily. 01/27/16   Delorise Royals Cuthriell, PA-C  oxyCODONE (OXY IR/ROXICODONE) 5 MG immediate release tablet Take 1 tablet (5 mg total) by mouth every 4 (four) hours as needed for moderate pain, severe pain or breakthrough pain. 11/14/15   Katharina Caper, MD  oxyCODONE-acetaminophen (ROXICET) 5-325 MG tablet Take 1-2 tablets by mouth every 4 (four) hours as needed for severe pain. 02/07/16   Charmayne Sheer Beers, PA-C  traMADol (ULTRAM) 50 MG tablet Take 2 tablets (100 mg total) by mouth 3 (three) times daily as needed. 02/28/16   Keyarra Rendall V Bacon Searcy Miyoshi, PA-C  valACYclovir (VALTREX) 500 MG tablet Take 500 mg by mouth 2 (two) times daily. 10/03/15   Historical Provider, MD    Allergies Penicillins and Tramadol  Family History  Problem Relation Age of Onset  . Heart disease Mother     Social History Social History  Substance Use Topics  . Smoking status: Former Smoker    Packs/day: 0.50    Types: Cigarettes    Quit date: 11/13/2015  . Smokeless tobacco: Current User  . Alcohol use No     Comment: socially    Review of Systems  Constitutional: Negative for fever. Musculoskeletal: Negative for back pain. Reports chronic right knee pain as above. Skin: Negative for  rash. Neurological: Negative for headaches, focal weakness or numbness. ____________________________________________  PHYSICAL EXAM:  VITAL SIGNS: ED Triage Vitals  Enc Vitals Group     BP 02/28/16 1436 (!) 159/118     Pulse Rate 02/28/16 1436 76     Resp 02/28/16 1436 20     Temp 02/28/16 1440 98.4 F (36.9 C)     Temp Source 02/28/16 1440 Oral     SpO2 02/28/16 1436 99 %     Weight 02/28/16 1438 183 lb (83 kg)     Height 02/28/16 1438 5\' 5"  (1.651 m)     Head Circumference --      Peak Flow --      Pain Score 02/28/16 1450 10     Pain Loc --      Pain Edu? --      Excl. in GC? --    Constitutional: Alert and oriented. Well appearing and in no distress. Head: Normocephalic and atraumatic. Cardiovascular: Normal distal pulses. Respiratory: Normal respiratory effort.  Musculoskeletal: Right knee with obvious deformity, effusion, or edema. Chronic bony changes noted. Nontender with normal range of motion in all extremities.  Skin:  Skin is warm, dry and intact. No rash noted. ____________________________________________  PROCEDURES  Gabapentin 300 mg PO Flexeril 10 mg PO Ultram 100 mg PO ____________________________________________  INITIAL IMPRESSION / ASSESSMENT AND PLAN / ED COURSE  Patient with chronic right knee pain secondary to post medic osteoarthritis. She is Discharged with Prescriptions for Gabapentin, Flexeril, and Ultram. Patient Is Advised That She Will Likely Undergo Total Knee Arthroplasty Next 2-4 Weeks. As Such, She Should Avoid Dosing High Quantities of Narcotic Pain Medicines in the Interim. Patient Agrees with the Plan and [facility].  Clinical Course   ____________________________________________  FINAL CLINICAL IMPRESSION(S) / ED DIAGNOSES  Final diagnoses:  Chronic knee pain, right  Post-traumatic osteoarthritis of right knee      Lissa HoardJenise V Bacon Talor Cheema, PA-C 02/28/16 1913    Arnaldo NatalPaul F Malinda,  MD 02/29/16 0006

## 2016-02-28 NOTE — ED Triage Notes (Signed)
Pt to ed with c/o right knee pain.  Pt states MRI yesterday, referred to see surgeon but states pain is overwhelming and unbearable.

## 2016-02-28 NOTE — ED Notes (Signed)
Pt reports right knee pain - pt states she has to have a total [facility] - pt states she is here for pain control - pt states [facility] and they denied her and told her she had to follow up with surgeon next week (surgery scheduled for 2nd week in October)

## 2016-03-20 ENCOUNTER — Other Ambulatory Visit: Payer: Self-pay | Admitting: Orthopedic Surgery

## 2016-03-20 DIAGNOSIS — M1711 Unilateral primary osteoarthritis, right knee: Secondary | ICD-10-CM

## 2016-04-10 ENCOUNTER — Ambulatory Visit: Payer: Medicaid Other

## 2016-04-10 ENCOUNTER — Encounter: Payer: Self-pay | Admitting: *Deleted

## 2016-04-10 ENCOUNTER — Emergency Department
Admission: EM | Admit: 2016-04-10 | Discharge: 2016-04-10 | Disposition: A | Discharge status: Home / Self Care | Payer: Medicaid Other | Attending: Emergency Medicine | Admitting: Emergency Medicine

## 2016-04-10 ENCOUNTER — Emergency Department: Payer: Medicaid Other

## 2016-04-10 DIAGNOSIS — Y929 Unspecified place or not applicable: Secondary | ICD-10-CM | POA: Insufficient documentation

## 2016-04-10 DIAGNOSIS — F17200 Nicotine dependence, unspecified, uncomplicated: Secondary | ICD-10-CM | POA: Diagnosis not present

## 2016-04-10 DIAGNOSIS — Y939 Activity, unspecified: Secondary | ICD-10-CM | POA: Insufficient documentation

## 2016-04-10 DIAGNOSIS — W2203XA Walked into furniture, initial encounter: Secondary | ICD-10-CM | POA: Diagnosis not present

## 2016-04-10 DIAGNOSIS — Z21 Asymptomatic human immunodeficiency virus [HIV] infection status: Secondary | ICD-10-CM | POA: Diagnosis not present

## 2016-04-10 DIAGNOSIS — M25561 Pain in right knee: Secondary | ICD-10-CM

## 2016-04-10 DIAGNOSIS — Z7982 Long term (current) use of aspirin: Secondary | ICD-10-CM | POA: Insufficient documentation

## 2016-04-10 DIAGNOSIS — I10 Essential (primary) hypertension: Secondary | ICD-10-CM | POA: Insufficient documentation

## 2016-04-10 DIAGNOSIS — Z79899 Other long term (current) drug therapy: Secondary | ICD-10-CM | POA: Diagnosis not present

## 2016-04-10 DIAGNOSIS — Y999 Unspecified external cause status: Secondary | ICD-10-CM | POA: Insufficient documentation

## 2016-04-10 DIAGNOSIS — M1711 Unilateral primary osteoarthritis, right knee: Secondary | ICD-10-CM | POA: Diagnosis not present

## 2016-04-10 DIAGNOSIS — J45909 Unspecified asthma, uncomplicated: Secondary | ICD-10-CM | POA: Diagnosis not present

## 2016-04-10 MED ORDER — KETOROLAC TROMETHAMINE 30 MG/ML IJ SOLN
30.0000 mg | Freq: Once | INTRAMUSCULAR | Status: AC
Start: 2016-04-10 — End: 2016-04-10
  Administered 2016-04-10: 30 mg via INTRAMUSCULAR
  Filled 2016-04-10: qty 1

## 2016-04-10 MED ORDER — OXYCODONE-ACETAMINOPHEN 5-325 MG PO TABS
1.0000 | ORAL_TABLET | Freq: Once | ORAL | Status: AC
  Administered 2016-04-10: 1 via ORAL
  Filled 2016-04-10: qty 1

## 2016-04-10 MED ORDER — LIDOCAINE 5 % EX PTCH: 1.0000 | MEDICATED_PATCH | Freq: Two times a day (BID) | CUTANEOUS | 0 refills | Status: DC

## 2016-04-10 NOTE — ED Triage Notes (Signed)
Pt complains of right knee pain, pt is awaiting knee surgery per pt, pt hit knee on coffee table today

## 2016-04-10 NOTE — ED Provider Notes (Signed)
[facility] Emergency Department Provider Note  ____________________________________________  Time seen: Approximately 6:51 PM  I have reviewed the triage vital signs and the nursing notes.   HISTORY  Chief Complaint Knee Pain    HPI Diane Castillo is a 45 y.o. female presents to the ED with anterior and posterior right knee pain for two hours after she injured it from hitting her knee on an end table. She is being followed by orthopedics for a right knee replacement. She is unable to fully bear weight on her right knee.     Past Medical History:  Diagnosis Date  . Asthma   . HIV disease (HCC)   . Hypertension     Patient Active Problem List   Diagnosis Date Noted  . Right leg pain 11/14/2015  . Gastrocnemius muscle strain 11/14/2015  . Hypokalemia 11/14/2015  . Metabolic acidosis 11/14/2015  . Hyperglycemia 11/14/2015  . Drug abuse 11/14/2015  . Rhabdomyolysis 11/13/2015    Past Surgical History:  Procedure Laterality Date  . ABDOMINAL HYSTERECTOMY    . HAND SURGERY    . HERNIA REPAIR    . KNEE SURGERY      Prior to Admission medications   Medication Sig Start Date End Date Taking? Authorizing Provider  albuterol (PROVENTIL HFA) 108 (90 Base) MCG/ACT inhaler Inhale 2 puffs into the lungs every 6 (six) hours as needed. For nausea and/or vomiting. 10/03/15   Historical Provider, MD  aspirin EC 81 MG tablet Take 81 mg by mouth daily.     Historical Provider, MD  cyclobenzaprine (FLEXERIL) 5 MG tablet Take 1 tablet (5 mg total) by mouth 3 (three) times daily as needed for muscle spasms. 02/28/16   Jenise V Bacon Menshew, PA-C  docusate sodium (COLACE) 100 MG capsule Take 1 capsule (100 mg total) by mouth 2 (two) times daily. 11/14/15   Katharina Caper, MD  dolutegravir (TIVICAY) 50 MG tablet Take 50 mg by mouth daily. 05/16/15   Historical Provider, MD  emtricitabine-tenofovir (TRUVADA) 200-300 MG tablet Take 1 tablet by mouth daily. 05/16/15    Historical Provider, MD  gabapentin (NEURONTIN) 300 MG capsule Take 1 capsule (300 mg total) by mouth 3 (three) times daily. 02/28/16 02/27/17  Jenise V Bacon Menshew, PA-C  lidocaine (LIDODERM) 5 % Place 1 patch onto the skin every 12 (twelve) hours. Remove & Discard patch within 12 hours or as directed by MD 04/10/16   Delorise Royals Cuthriell, PA-C  meloxicam (MOBIC) 15 MG tablet Take 1 tablet (15 mg total) by mouth daily. 01/27/16   Delorise Royals Cuthriell, PA-C  oxyCODONE (OXY IR/ROXICODONE) 5 MG immediate release tablet Take 1 tablet (5 mg total) by mouth every 4 (four) hours as needed for moderate pain, severe pain or breakthrough pain. 11/14/15   Katharina Caper, MD  oxyCODONE-acetaminophen (ROXICET) 5-325 MG tablet Take 1-2 tablets by mouth every 4 (four) hours as needed for severe pain. 02/07/16   Charmayne Sheer Beers, PA-C  traMADol (ULTRAM) 50 MG tablet Take 2 tablets (100 mg total) by mouth 3 (three) times daily as needed. 02/28/16   Jenise V Bacon Menshew, PA-C  valACYclovir (VALTREX) 500 MG tablet Take 500 mg by mouth 2 (two) times daily. 10/03/15   Historical Provider, MD    Allergies Penicillins and Tramadol  Family History  Problem Relation Age of Onset  . Heart disease Mother     Social History Social History  Substance Use Topics  . Smoking status: Former Smoker    Packs/day: 0.50  Types: Cigarettes    Quit date: 11/13/2015  . Smokeless tobacco: Current User  . Alcohol use No     Comment: socially     Review of Systems  Constitutional: No fever/chills Eyes: No visual changes. No discharge ENT: No upper respiratory complaints. Cardiovascular: no chest pain. Respiratory: no cough. No SOB. Gastrointestinal: No abdominal pain.  No nausea, no vomiting.  No diarrhea.  No constipation. Musculoskeletal: positive for right knee pain and weakness. Denies N/T. Skin: Negative for rash, abrasions, lacerations, ecchymosis. Neurological: Negative for headaches, focal weakness or  numbness. 10-point ROS otherwise negative.  ____________________________________________   PHYSICAL EXAM:  VITAL SIGNS: ED Triage Vitals  Enc Vitals Group     BP 04/10/16 1734 (!) 133/95     Pulse Rate 04/10/16 1734 88     Resp 04/10/16 1734 20     Temp 04/10/16 1734 98.4 F (36.9 C)     Temp Source 04/10/16 1734 Oral     SpO2 04/10/16 1734 99 %     Weight 04/10/16 1735 183 lb (83 kg)     Height 04/10/16 1735 5\' 5"  (1.651 m)     Head Circumference --      Peak Flow --      Pain Score 04/10/16 1735 10     Pain Loc --      Pain Edu? --      Excl. in GC? --      Constitutional: Alert and oriented. Well appearing and in no acute distress. Eyes: Conjunctivae are normal. PERRL. EOMI. Head: Atraumatic. Cardiovascular: Normal rate, regular rhythm. Normal S1 and S2.  Good peripheral circulation. Distal pulses 2+ BIL.  Respiratory: Normal respiratory effort without tachypnea or retractions. Lungs CTAB. Good air entry to the bases with no decreased or absent breath sounds. Musculoskeletal: AROM normal in right hip flexion, extension, abduction and adduction.  Limited range of motion in right knee flexion. AROM normal in right knee extension.  AROM normal in right ankle flexion, extension, eversion and inversion.  Muscle strength 5/5 for right hip flexion, abduction and adduction.  Muscle weakness 4/5 for right knee flexion and extension.  Muscle strength 5/5 for right ankle flexion and extension.  Nontender to palpation along medial and lateral meniscus, patella, patella tendon, and anterior and posterior portion of the right knee. Negative Lachmans test.  No erythema and no edema.  Neurologic:  Normal speech and language. No gross focal neurologic deficits are appreciated. Sensation normal on lower extremities BIL.  Skin:  Skin is warm, dry and intact. No rash noted. Psychiatric: Mood and affect are normal. Speech and behavior are normal. Patient exhibits appropriate insight and  judgement.   ____________________________________________   LABS (all labs ordered are listed, but only abnormal results are displayed)  Labs Reviewed - No data to display ____________________________________________  EKG   ____________________________________________  RADIOLOGY Festus BarrenI, Jonathan D Cuthriell, personally viewed and evaluated these images (plain radiographs) as part of my medical decision making, as well as reviewing the written report by the radiologist.  Dg Knee Complete 4 Views Right  Result Date: 04/10/2016 CLINICAL DATA:  45 y/o  F; knee pain. EXAM: RIGHT KNEE - COMPLETE 4+ VIEW COMPARISON:  02/27/2016 MRI of the knee 02/07/2016 CT of the knee. FINDINGS: Postsurgical changes related to ACL repair with anchor along the medial proximal tibial cortex. Moderate bilateral femorotibial compartment and patellofemoral compartment joint space narrowing and small periarticular osteophytes. Density projecting over the posterior joint space may represent a intra-articular body. No significant  interval change in comparison with prior knee radiographs. IMPRESSION: Stable tricompartmental osteoarthrosis of the knee, intra-articular body, and postsurgical changes related to anterior cruciate ligament repair. No acute fracture identified. Electronically Signed   By: Mitzi Hansen M.D.   On: 04/10/2016 21:00    ____________________________________________    PROCEDURES  Procedure(s) performed:    Procedures    Medications  oxyCODONE-acetaminophen (PERCOCET/ROXICET) 5-325 MG per tablet 1 tablet (1 tablet Oral Given 04/10/16 1959)  ketorolac (TORADOL) 30 MG/ML injection 30 mg (30 mg Intramuscular Given 04/10/16 2000)     ____________________________________________   INITIAL IMPRESSION / ASSESSMENT AND PLAN / ED COURSE  Pertinent labs & imaging results that were available during my care of the patient were reviewed by me and considered in my medical decision  making (see chart for details).  Review of the [place] CSRS was performed in accordance of the [facility] prior to dispensing any controlled drugs.  Clinical Course    Patient's diagnosis is consistent with Severe osteoarthritis to the right knee with increased right knee pain. No indication of new, acute injury at this time. Patient is already on meloxicam for this complaint. She is awaiting approval for knee replacement surgery. Patient will be given prescription for lidocaine patch in addition to her other medications. Patient will follow-up with orthopedics as needed..  Patient is given ED precautions to return to the ED for any worsening or new symptoms.     ____________________________________________  FINAL CLINICAL IMPRESSION(S) / ED DIAGNOSES  Final diagnoses:  Acute pain of right knee  Primary osteoarthritis of right knee      NEW MEDICATIONS STARTED DURING THIS VISIT:  Discharge Medication List as of 04/10/2016  9:14 PM    START taking these medications   Details  lidocaine (LIDODERM) 5 % Place 1 patch onto the skin every 12 (twelve) hours. Remove & Discard patch within 12 hours or as directed by MD, Starting Tue 04/10/2016, Print            This chart was dictated using voice recognition software/Dragon. Despite best efforts to proofread, errors can occur which can change the meaning. Any change was purely unintentional.    Racheal Patches, PA-C 04/10/16 2151    Charlynne Pander, MD 04/10/16 2329

## 2016-05-08 ENCOUNTER — Emergency Department
Admission: EM | Admit: 2016-05-08 | Discharge: 2016-05-08 | Disposition: A | Discharge status: Home / Self Care | Payer: Medicaid Other | Attending: Emergency Medicine | Admitting: Emergency Medicine

## 2016-05-08 ENCOUNTER — Encounter: Payer: Self-pay | Admitting: *Deleted

## 2016-05-08 ENCOUNTER — Emergency Department: Payer: Medicaid Other

## 2016-05-08 DIAGNOSIS — Y929 Unspecified place or not applicable: Secondary | ICD-10-CM | POA: Insufficient documentation

## 2016-05-08 DIAGNOSIS — S8001XA Contusion of right knee, initial encounter: Secondary | ICD-10-CM | POA: Insufficient documentation

## 2016-05-08 DIAGNOSIS — I10 Essential (primary) hypertension: Secondary | ICD-10-CM | POA: Insufficient documentation

## 2016-05-08 DIAGNOSIS — Z79899 Other long term (current) drug therapy: Secondary | ICD-10-CM | POA: Insufficient documentation

## 2016-05-08 DIAGNOSIS — Y9389 Activity, other specified: Secondary | ICD-10-CM | POA: Diagnosis not present

## 2016-05-08 DIAGNOSIS — Y990 Civilian activity done for income or pay: Secondary | ICD-10-CM | POA: Diagnosis not present

## 2016-05-08 DIAGNOSIS — W228XXA Striking against or struck by other objects, initial encounter: Secondary | ICD-10-CM | POA: Insufficient documentation

## 2016-05-08 DIAGNOSIS — F17200 Nicotine dependence, unspecified, uncomplicated: Secondary | ICD-10-CM | POA: Diagnosis not present

## 2016-05-08 DIAGNOSIS — J45909 Unspecified asthma, uncomplicated: Secondary | ICD-10-CM | POA: Diagnosis not present

## 2016-05-08 DIAGNOSIS — S8991XA Unspecified injury of right lower leg, initial encounter: Secondary | ICD-10-CM | POA: Diagnosis present

## 2016-05-08 DIAGNOSIS — Z7982 Long term (current) use of aspirin: Secondary | ICD-10-CM | POA: Diagnosis not present

## 2016-05-08 MED ORDER — KETOROLAC TROMETHAMINE 30 MG/ML IJ SOLN
30.0000 mg | Freq: Once | INTRAMUSCULAR | Status: AC
  Administered 2016-05-08: 30 mg via INTRAMUSCULAR
  Filled 2016-05-08: qty 1

## 2016-05-08 NOTE — ED Provider Notes (Signed)
[facility] Emergency Department Provider Note  ____________________________________________  Time seen: Approximately 9:00 PM  I have reviewed the triage vital signs and the nursing notes.   HISTORY  Chief Complaint Knee Injury    HPI Diane AcostaSarah A Castillo is a 45 y.o. female , NAD, presents to the emergency department with several hour history of right knee pain. Patient states she was at work and hit her medial right knee on a metal cattle. States she went home and has treated with medication she has been previously prescribed. States pain has not improved. Notes some minor swelling about the medial part of the knee. States she has chronic osteoarthritis of the knee and needs a knee replacement. Denies any numbness, weakness, tingling. Has not noted any redness, warmth, open wounds or lacerations. States bearing weight causes increasing pain about the knee.   Past Medical History:  Diagnosis Date  . Asthma   . HIV disease (HCC)   . Hypertension     Patient Active Problem List   Diagnosis Date Noted  . Right leg pain 11/14/2015  . Gastrocnemius muscle strain 11/14/2015  . Hypokalemia 11/14/2015  . Metabolic acidosis 11/14/2015  . Hyperglycemia 11/14/2015  . Drug abuse 11/14/2015  . Rhabdomyolysis 11/13/2015    Past Surgical History:  Procedure Laterality Date  . ABDOMINAL HYSTERECTOMY    . HAND SURGERY    . HERNIA REPAIR    . KNEE SURGERY      Prior to Admission medications   Medication Sig Start Date End Date Taking? Authorizing Provider  albuterol (PROVENTIL HFA) 108 (90 Base) MCG/ACT inhaler Inhale 2 puffs into the lungs every 6 (six) hours as needed. For nausea and/or vomiting. 10/03/15   Historical Provider, MD  aspirin EC 81 MG tablet Take 81 mg by mouth daily.     Historical Provider, MD  cyclobenzaprine (FLEXERIL) 5 MG tablet Take 1 tablet (5 mg total) by mouth 3 (three) times daily as needed for muscle spasms. 02/28/16   Jenise V Bacon  Menshew, PA-C  docusate sodium (COLACE) 100 MG capsule Take 1 capsule (100 mg total) by mouth 2 (two) times daily. 11/14/15   Katharina Caperima [place]ute, MD  dolutegravir (TIVICAY) 50 MG tablet Take 50 mg by mouth daily. 05/16/15   Historical Provider, MD  emtricitabine-tenofovir (TRUVADA) 200-300 MG tablet Take 1 tablet by mouth daily. 05/16/15   Historical Provider, MD  gabapentin (NEURONTIN) 300 MG capsule Take 1 capsule (300 mg total) by mouth 3 (three) times daily. 02/28/16 02/27/17  Jenise V Bacon Menshew, PA-C  lidocaine (LIDODERM) 5 % Place 1 patch onto the skin every 12 (twelve) hours. Remove & Discard patch within 12 hours or as directed by MD 04/10/16   Delorise RoyalsJonathan D Cuthriell, PA-C  meloxicam (MOBIC) 15 MG tablet Take 1 tablet (15 mg total) by mouth daily. 01/27/16   Delorise RoyalsJonathan D Cuthriell, PA-C  oxyCODONE (OXY IR/ROXICODONE) 5 MG immediate release tablet Take 1 tablet (5 mg total) by mouth every 4 (four) hours as needed for moderate pain, severe pain or breakthrough pain. 11/14/15   Katharina Caperima [place], MD  oxyCODONE-acetaminophen (ROXICET) 5-325 MG tablet Take 1-2 tablets by mouth every 4 (four) hours as needed for severe pain. 02/07/16   Charmayne Sheerharles M Beers, PA-C  traMADol (ULTRAM) 50 MG tablet Take 2 tablets (100 mg total) by mouth 3 (three) times daily as needed. 02/28/16   Jenise V Bacon Menshew, PA-C  valACYclovir (VALTREX) 500 MG tablet Take 500 mg by mouth 2 (two) times daily. 10/03/15  Historical Provider, MD    Allergies Penicillins and Tramadol  Family History  Problem Relation Age of Onset  . Heart disease Mother     Social History Social History  Substance Use Topics  . Smoking status: Former Smoker    Packs/day: 0.50    Types: Cigarettes    Quit date: 11/13/2015  . Smokeless tobacco: Current User  . Alcohol use No     Comment: socially     Review of Systems  Constitutional: No fever/chills Musculoskeletal: Positive right knee pain.  Skin: Positive swelling right knee. Negative for rash,  redness, abnormal warmth, bruising, open wounds or lacerations. Neurological: Negative for numbness, wheeze, tingling.   ____________________________________________   PHYSICAL EXAM:  VITAL SIGNS: ED Triage Vitals  Enc Vitals Group     BP 05/08/16 2023 112/64     Pulse Rate 05/08/16 2021 72     Resp 05/08/16 2021 20     Temp 05/08/16 2021 98.7 F (37.1 C)     Temp Source 05/08/16 2021 Oral     SpO2 05/08/16 2021 99 %     Weight 05/08/16 2022 181 lb (82.1 kg)     Height 05/08/16 2022 5\' 5"  (1.651 m)     Head Circumference --      Peak Flow --      Pain Score 05/08/16 2022 10     Pain Loc --      Pain Edu? --      Excl. in GC? --      Constitutional: Alert and oriented. Well appearing and in no acute distress. Eyes: Conjunctivae are normal.  Head: Atraumatic. Cardiovascular: Good peripheral circulation with 2+ pulses noted in the right lower extremity. Respiratory: Normal respiratory effort without tachypnea or retractions.  Musculoskeletal: Tenderness to palpation about the medial right knee with mild swelling but no effusion. No laxity with anterior posterior drawer. No laxity with varus valgus stress.  No lower extremity tenderness nor edema.  No joint effusions. Neurologic:  Normal speech and language. No gross focal neurologic deficits are appreciated.  Skin:  Skin is warm, dry and intact. No rash, redness, abnormal warmth, open wounds or lacerations noted. Psychiatric: Mood and affect are normal. Speech and behavior are normal. Patient exhibits appropriate insight and judgement.   ____________________________________________   LABS  None ____________________________________________  EKG  None ____________________________________________  RADIOLOGY I, Hope PigeonJami L Hagler, personally viewed and evaluated these images (plain radiographs) as part of my medical decision making, as well as reviewing the written report by the radiologist.  Dg Knee Complete 4 Views  Right  Result Date: 05/08/2016 CLINICAL DATA:  Right knee pain with injury EXAM: RIGHT KNEE - COMPLETE 4+ VIEW COMPARISON:  04/10/2016 FINDINGS: No acute fracture or malalignment. Patient is status post ACL repair with similar alignment, metallic anchor in the proximal tibia is similar in alignment. Mild to moderate narrowing of the medial compartment with osteophyte. Mild narrowing of the lateral compartment with osteophyte. Ovoid opacities projecting over the central joint space on the frontal view are unchanged and could relate to small loose bodies. There is patellofemoral narrowing with superior and inferior osteophytes. Trace joint effusion. IMPRESSION: Postsurgical changes of the right knee along with moderate degenerative changes. No definite acute osseous abnormality. Electronically Signed   By: Jasmine PangKim  Fujinaga M.D.   On: 05/08/2016 20:52    ____________________________________________    PROCEDURES  Procedure(s) performed: None   Procedures   Medications  ketorolac (TORADOL) 30 MG/ML injection 30 mg (30 mg Intramuscular  Given 05/08/16 2130)     ____________________________________________   INITIAL IMPRESSION / ASSESSMENT AND PLAN / ED COURSE  Pertinent labs & imaging results that were available during my care of the patient were reviewed by me and considered in my medical decision making (see chart for details).  Clinical Course     Patient's diagnosis is consistent with contusion of right knee. Patient will be discharged home with instructions to apply ice to the affected area 20 minutes 3-4 times daily as needed. May continue present prescribed medications for chronic osteoarthritis and may add Tylenol as needed. Patient is to follow up with her orthopedic specialist if symptoms persist past this treatment course. Patient is given ED precautions to return to the ED for any worsening or new symptoms.    ____________________________________________  FINAL CLINICAL  IMPRESSION(S) / ED DIAGNOSES  Final diagnoses:  Contusion of right knee, initial encounter      NEW MEDICATIONS STARTED DURING THIS VISIT:  New Prescriptions   No medications on file         Hope Pigeon, PA-C 05/08/16 2145    Myrna Blazer, MD 05/08/16 2342

## 2016-05-08 NOTE — ED Triage Notes (Signed)
Pt has right knee pain.  Pt struck leg/knee on a metal kettle today while at work   Pt states no WC.  Pt states painful to ambulate.

## 2016-06-11 ENCOUNTER — Encounter: Payer: Self-pay | Admitting: Emergency Medicine

## 2016-06-11 ENCOUNTER — Emergency Department
Admission: EM | Admit: 2016-06-11 | Discharge: 2016-06-11 | Disposition: A | Discharge status: Home / Self Care | Payer: Medicaid Other | Attending: Emergency Medicine | Admitting: Emergency Medicine

## 2016-06-11 DIAGNOSIS — M25561 Pain in right knee: Secondary | ICD-10-CM | POA: Diagnosis present

## 2016-06-11 DIAGNOSIS — M1711 Unilateral primary osteoarthritis, right knee: Secondary | ICD-10-CM | POA: Insufficient documentation

## 2016-06-11 DIAGNOSIS — J45909 Unspecified asthma, uncomplicated: Secondary | ICD-10-CM | POA: Insufficient documentation

## 2016-06-11 DIAGNOSIS — Z21 Asymptomatic human immunodeficiency virus [HIV] infection status: Secondary | ICD-10-CM | POA: Insufficient documentation

## 2016-06-11 DIAGNOSIS — F17200 Nicotine dependence, unspecified, uncomplicated: Secondary | ICD-10-CM | POA: Insufficient documentation

## 2016-06-11 DIAGNOSIS — Z79899 Other long term (current) drug therapy: Secondary | ICD-10-CM | POA: Insufficient documentation

## 2016-06-11 DIAGNOSIS — I10 Essential (primary) hypertension: Secondary | ICD-10-CM | POA: Insufficient documentation

## 2016-06-11 DIAGNOSIS — Z7982 Long term (current) use of aspirin: Secondary | ICD-10-CM | POA: Insufficient documentation

## 2016-06-11 MED ORDER — OXYCODONE-ACETAMINOPHEN 5-325 MG PO TABS: 1.0000 | ORAL_TABLET | Freq: Four times a day (QID) | ORAL | 0 refills | Status: DC | PRN

## 2016-06-11 MED ORDER — KETOROLAC TROMETHAMINE 60 MG/2ML IM SOLN
60.0000 mg | Freq: Once | INTRAMUSCULAR | Status: AC
  Administered 2016-06-11: 60 mg via INTRAMUSCULAR
  Filled 2016-06-11: qty 2

## 2016-06-11 MED ORDER — HYDROMORPHONE HCL 1 MG/ML IJ SOLN
1.0000 mg | Freq: Once | INTRAMUSCULAR | Status: AC
  Administered 2016-06-11: 1 mg via INTRAMUSCULAR
  Filled 2016-06-11: qty 1

## 2016-06-11 NOTE — Discharge Instructions (Signed)
Continue previous medications and follow-up with treating orthopedic Dr.

## 2016-06-11 NOTE — ED Provider Notes (Signed)
[facility] Emergency Department Provider Note   ____________________________________________   None    (approximate)  I have reviewed the triage vital signs and the nursing notes.   HISTORY  Chief Complaint Knee Pain    HPI Diane Castillo is a 45 y.o. female patient complain of continue right knee pain. Patient states she has been evaluated by orthopedic and is scheduled for knee replacement begin of the new year. Patient state moped, gabapentin, and Lyrica is not helping with her pain.Patient rates the pain as a 10 over 10 and describes pain as "sharp and shooting".   Past Medical History:  Diagnosis Date  . Asthma   . HIV disease (HCC)   . Hypertension     Patient Active Problem List   Diagnosis Date Noted  . Right leg pain 11/14/2015  . Gastrocnemius muscle strain 11/14/2015  . Hypokalemia 11/14/2015  . Metabolic acidosis 11/14/2015  . Hyperglycemia 11/14/2015  . Drug abuse 11/14/2015  . Rhabdomyolysis 11/13/2015    Past Surgical History:  Procedure Laterality Date  . ABDOMINAL HYSTERECTOMY    . HAND SURGERY    . HERNIA REPAIR    . KNEE SURGERY      Prior to Admission medications   Medication Sig Start Date End Date Taking? Authorizing Provider  albuterol (PROVENTIL HFA) 108 (90 Base) MCG/ACT inhaler Inhale 2 puffs into the lungs every 6 (six) hours as needed. For nausea and/or vomiting. 10/03/15   Historical Provider, MD  aspirin EC 81 MG tablet Take 81 mg by mouth daily.     Historical Provider, MD  cyclobenzaprine (FLEXERIL) 5 MG tablet Take 1 tablet (5 mg total) by mouth 3 (three) times daily as needed for muscle spasms. 02/28/16   Jenise V Bacon Menshew, PA-C  docusate sodium (COLACE) 100 MG capsule Take 1 capsule (100 mg total) by mouth 2 (two) times daily. 11/14/15   Katharina Caperima Vaickute, MD  dolutegravir (TIVICAY) 50 MG tablet Take 50 mg by mouth daily. 05/16/15   Historical Provider, MD  emtricitabine-tenofovir (TRUVADA) 200-300 MG  tablet Take 1 tablet by mouth daily. 05/16/15   Historical Provider, MD  gabapentin (NEURONTIN) 300 MG capsule Take 1 capsule (300 mg total) by mouth 3 (three) times daily. 02/28/16 02/27/17  Jenise V Bacon Menshew, PA-C  lidocaine (LIDODERM) 5 % Place 1 patch onto the skin every 12 (twelve) hours. Remove & Discard patch within 12 hours or as directed by MD 04/10/16   Delorise RoyalsJonathan D Cuthriell, PA-C  meloxicam (MOBIC) 15 MG tablet Take 1 tablet (15 mg total) by mouth daily. 01/27/16   Delorise RoyalsJonathan D Cuthriell, PA-C  oxyCODONE (OXY IR/ROXICODONE) 5 MG immediate release tablet Take 1 tablet (5 mg total) by mouth every 4 (four) hours as needed for moderate pain, severe pain or breakthrough pain. 11/14/15   Katharina Caperima Vaickute, MD  oxyCODONE-acetaminophen (ROXICET) 5-325 MG tablet Take 1-2 tablets by mouth every 4 (four) hours as needed for severe pain. 02/07/16   Charmayne Sheerharles M Beers, PA-C  oxyCODONE-acetaminophen (ROXICET) 5-325 MG tablet Take 1 tablet by mouth every 6 (six) hours as needed. 06/11/16 06/11/17  Joni Reiningonald K Smith, PA-C  traMADol (ULTRAM) 50 MG tablet Take 2 tablets (100 mg total) by mouth 3 (three) times daily as needed. 02/28/16   Jenise V Bacon Menshew, PA-C  valACYclovir (VALTREX) 500 MG tablet Take 500 mg by mouth 2 (two) times daily. 10/03/15   Historical Provider, MD    Allergies Penicillins and Tramadol  Family History  Problem Relation Age  of Onset  . Heart disease Mother     Social History Social History  Substance Use Topics  . Smoking status: Former Smoker    Packs/day: 0.50    Types: Cigarettes    Quit date: 11/13/2015  . Smokeless tobacco: Current User  . Alcohol use No     Comment: socially    Review of Systems Constitutional: No fever/chills Eyes: No visual changes. ENT: No sore throat. Cardiovascular: Denies chest pain. Respiratory: Denies shortness of breath. Gastrointestinal: No abdominal pain.  No nausea, no vomiting.  No diarrhea.  No constipation. Genitourinary: Negative for  dysuria. Musculoskeletal: Right knee pain Skin: Negative for rash. Neurological: Negative for headaches, focal weakness or numbness. Endocrine:Hypertension Hematological/Lymphatic:HIV Allergic/Immunilogical: Penicillin and tramadol ____________________________________________   PHYSICAL EXAM:  VITAL SIGNS: ED Triage Vitals [06/11/16 1646]  Enc Vitals Group     BP 110/61     Pulse Rate 73     Resp 18     Temp 98.6 F (37 C)     Temp Source Oral     SpO2 98 %     Weight 202 lb (91.6 kg)     Height 5\' 6"  (1.676 m)     Head Circumference      Peak Flow      Pain Score 10     Pain Loc      Pain Edu?      Excl. in GC?     Constitutional: Alert and oriented. Well appearing and in no acute distress. Eyes: Conjunctivae are normal. PERRL. EOMI. Head: Atraumatic. Nose: No congestion/rhinnorhea. Mouth/Throat: Mucous membranes are moist.  Oropharynx non-erythematous. Neck: No stridor.  No cervical spine tenderness to palpation. Hematological/Lymphatic/Immunilogical: No cervical lymphadenopathy. Cardiovascular: Normal rate, regular rhythm. Grossly normal heart sounds.  Good peripheral circulation. Respiratory: Normal respiratory effort.  No retractions. Lungs CTAB. Gastrointestinal: Soft and nontender. No distention. No abdominal bruits. No CVA tenderness. Musculoskeletal: No obvious deformity of the right knee. Moderate crepitus palpation. Patient has full nuchal range of motion. No laxity with stress testing.  Neurologic:  Normal speech and language. No gross focal neurologic deficits are appreciated. No gait instability. Skin:  Skin is warm, dry and intact. No rash noted. Psychiatric: Mood and affect are normal. Speech and behavior are normal.  ____________________________________________   LABS (all labs ordered are listed, but only abnormal results are displayed)  Labs Reviewed - No data to  display ____________________________________________  EKG   ____________________________________________  RADIOLOGY  Reviewed x-ray of the knee showing moderate to severe arthritis.  ____________________________________________   PROCEDURES  Procedure(s) performed: None  Procedures  Critical Care performed: No  ____________________________________________   INITIAL IMPRESSION / ASSESSMENT AND PLAN / ED COURSE  Pertinent labs & imaging results that were available during my care of the patient were reviewed by me and considered in my medical decision making (see chart for details).  Right knee pain secondary to osteoarthritis. Patient given discharge care instructions. Patient given a prescription for Percocet for 3 days. Patient advised to follow-up with treat orthopedic Dr.  Clinical Course      ____________________________________________   FINAL CLINICAL IMPRESSION(S) / ED DIAGNOSES  Final diagnoses:  Acute pain of right knee      NEW MEDICATIONS STARTED DURING THIS VISIT:  New Prescriptions   OXYCODONE-ACETAMINOPHEN (ROXICET) 5-325 MG TABLET    Take 1 tablet by mouth every 6 (six) hours as needed.     Note:  This document was prepared using Conservation officer, historic buildings and may include  unintentional dictation errors.    Joni Reiningonald K Smith, PA-C 06/11/16 1718    Jennye MoccasinBrian S Quigley, MD 06/12/16 [phone]

## 2016-06-11 NOTE — ED Triage Notes (Addendum)
Pt c/o arthritis to R knee. States she is being followed by Dr. Rosita KeaMenz and [place] [facility]ighbor[facility]ris Gaines, [facility] at [facility]. Pt states that she has a knee replacement scheduled for the beginning of the year but the pain has progressed to the point she can't take it. Pt ambulatory in the lobby with a limp. Pt states was prescribed tramadol, mobic, gabapentin, and lyrica without relief. Pt denies any further injury, states the pain has gradually worsened.

## 2016-06-20 ENCOUNTER — Emergency Department
Admission: EM | Admit: 2016-06-20 | Discharge: 2016-06-20 | Disposition: A | Discharge status: Home / Self Care | Payer: Medicaid Other | Attending: Emergency Medicine | Admitting: Emergency Medicine

## 2016-06-20 ENCOUNTER — Emergency Department: Payer: Medicaid Other

## 2016-06-20 ENCOUNTER — Encounter: Payer: Self-pay | Admitting: Emergency Medicine

## 2016-06-20 DIAGNOSIS — F1290 Cannabis use, unspecified, uncomplicated: Secondary | ICD-10-CM | POA: Insufficient documentation

## 2016-06-20 DIAGNOSIS — Z79899 Other long term (current) drug therapy: Secondary | ICD-10-CM | POA: Insufficient documentation

## 2016-06-20 DIAGNOSIS — I10 Essential (primary) hypertension: Secondary | ICD-10-CM | POA: Insufficient documentation

## 2016-06-20 DIAGNOSIS — R110 Nausea: Secondary | ICD-10-CM | POA: Diagnosis not present

## 2016-06-20 DIAGNOSIS — R509 Fever, unspecified: Secondary | ICD-10-CM | POA: Insufficient documentation

## 2016-06-20 DIAGNOSIS — R6889 Other general symptoms and signs: Secondary | ICD-10-CM

## 2016-06-20 DIAGNOSIS — F17290 Nicotine dependence, other tobacco product, uncomplicated: Secondary | ICD-10-CM | POA: Insufficient documentation

## 2016-06-20 DIAGNOSIS — R51 Headache: Secondary | ICD-10-CM | POA: Insufficient documentation

## 2016-06-20 DIAGNOSIS — Z7982 Long term (current) use of aspirin: Secondary | ICD-10-CM | POA: Diagnosis not present

## 2016-06-20 DIAGNOSIS — R05 Cough: Secondary | ICD-10-CM | POA: Diagnosis not present

## 2016-06-20 DIAGNOSIS — R0981 Nasal congestion: Secondary | ICD-10-CM | POA: Diagnosis not present

## 2016-06-20 DIAGNOSIS — J029 Acute pharyngitis, unspecified: Secondary | ICD-10-CM | POA: Diagnosis not present

## 2016-06-20 DIAGNOSIS — J45909 Unspecified asthma, uncomplicated: Secondary | ICD-10-CM | POA: Insufficient documentation

## 2016-06-20 LAB — RAPID INFLUENZA A&B ANTIGENS (ARMC ONLY)
INFLUENZA A (ARMC): NEGATIVE
INFLUENZA B (ARMC): NEGATIVE

## 2016-06-20 MED ORDER — HYDROCOD POLST-CPM POLST ER 10-8 MG/5ML PO SUER
ORAL | Status: AC
  Administered 2016-06-20: 5 mL via ORAL
  Filled 2016-06-20: qty 5

## 2016-06-20 MED ORDER — HYDROCOD POLST-CPM POLST ER 10-8 MG/5ML PO SUER
5.0000 mL | Freq: Once | ORAL | Status: AC
  Administered 2016-06-20: 5 mL via ORAL

## 2016-06-20 MED ORDER — PSEUDOEPH-BROMPHEN-DM 30-2-10 MG/5ML PO SYRP: 5.0000 mL | ORAL_SOLUTION | Freq: Four times a day (QID) | ORAL | 0 refills | Status: DC | PRN

## 2016-06-20 MED ORDER — KETOROLAC TROMETHAMINE 60 MG/2ML IM SOLN
60.0000 mg | Freq: Once | INTRAMUSCULAR | Status: AC
  Administered 2016-06-20: 60 mg via INTRAMUSCULAR

## 2016-06-20 MED ORDER — KETOROLAC TROMETHAMINE 60 MG/2ML IM SOLN
INTRAMUSCULAR | Status: AC
  Administered 2016-06-20: 60 mg via INTRAMUSCULAR
  Filled 2016-06-20: qty 2

## 2016-06-20 NOTE — ED Notes (Addendum)
See triage note  States she developed body aches with fever/chills 2 days ago  Now also having cough ,some wheezing and sore throat afebrile on arrival  Also having some discomfort in chest with inspiration   Cough is occasionally prod

## 2016-06-20 NOTE — ED Triage Notes (Addendum)
Has had body aches, chills, cough, and nasal congestion for couple days. Reports a lot of people at her job have been sick. No respiratory distress noted. Has had green productive cough.

## 2016-06-20 NOTE — ED Provider Notes (Signed)
[facility] Emergency Department Provider Note   ____________________________________________   First MD Initiated Contact with Patient 06/20/16 [phone]     (approximate)  I have reviewed the triage vital signs and the nursing notes.   HISTORY  Chief Complaint Generalized Body Aches    HPI Diane Castillo is a 46 y.o. female patient complaining of body aches, chills, nasal congestion, and productive cough for several days. Patient state cough producing green sputum. Patient state a lot of coworkers are sick at a job. Patient denies vomiting or diarrhea state there is some mild nausea. No palliative measures for this complaint.  Past Medical History:  Diagnosis Date  . Asthma   . Hypertension     Patient Active Problem List   Diagnosis Date Noted  . Right leg pain 11/14/2015  . Gastrocnemius muscle strain 11/14/2015  . Hypokalemia 11/14/2015  . Metabolic acidosis 11/14/2015  . Hyperglycemia 11/14/2015  . Drug abuse 11/14/2015  . Rhabdomyolysis 11/13/2015    Past Surgical History:  Procedure Laterality Date  . ABDOMINAL HYSTERECTOMY    . HAND SURGERY    . HERNIA REPAIR    . KNEE SURGERY      Prior to Admission medications   Medication Sig Start Date End Date Taking? Authorizing Provider  albuterol (PROVENTIL HFA) 108 (90 Base) MCG/ACT inhaler Inhale 2 puffs into the lungs every 6 (six) hours as needed. For nausea and/or vomiting. 10/03/15   Historical Provider, MD  aspirin EC 81 MG tablet Take 81 mg by mouth daily.     Historical Provider, MD  brompheniramine-pseudoephedrine-DM 30-2-10 MG/5ML syrup Take 5 mLs by mouth 4 (four) times daily as needed. 06/20/16   Joni Reiningonald K Chrystopher Stangl, PA-C  cyclobenzaprine (FLEXERIL) 5 MG tablet Take 1 tablet (5 mg total) by mouth 3 (three) times daily as needed for muscle spasms. 02/28/16   Jenise V Bacon Menshew, PA-C  docusate sodium (COLACE) 100 MG capsule Take 1 capsule (100 mg total) by mouth 2 (two) times daily.  11/14/15   Katharina Caperima Vaickute, MD  dolutegravir (TIVICAY) 50 MG tablet Take 50 mg by mouth daily. 05/16/15   Historical Provider, MD  emtricitabine-tenofovir (TRUVADA) 200-300 MG tablet Take 1 tablet by mouth daily. 05/16/15   Historical Provider, MD  gabapentin (NEURONTIN) 300 MG capsule Take 1 capsule (300 mg total) by mouth 3 (three) times daily. 02/28/16 02/27/17  Jenise V Bacon Menshew, PA-C  lidocaine (LIDODERM) 5 % Place 1 patch onto the skin every 12 (twelve) hours. Remove & Discard patch within 12 hours or as directed by MD 04/10/16   Delorise RoyalsJonathan D Cuthriell, PA-C  meloxicam (MOBIC) 15 MG tablet Take 1 tablet (15 mg total) by mouth daily. 01/27/16   Delorise RoyalsJonathan D Cuthriell, PA-C  oxyCODONE (OXY IR/ROXICODONE) 5 MG immediate release tablet Take 1 tablet (5 mg total) by mouth every 4 (four) hours as needed for moderate pain, severe pain or breakthrough pain. 11/14/15   Katharina Caperima Vaickute, MD  oxyCODONE-acetaminophen (ROXICET) 5-325 MG tablet Take 1-2 tablets by mouth every 4 (four) hours as needed for severe pain. 02/07/16   Charmayne Sheerharles M Beers, PA-C  oxyCODONE-acetaminophen (ROXICET) 5-325 MG tablet Take 1 tablet by mouth every 6 (six) hours as needed. 06/11/16 06/11/17  Joni Reiningonald K Naraly Fritcher, PA-C  traMADol (ULTRAM) 50 MG tablet Take 2 tablets (100 mg total) by mouth 3 (three) times daily as needed. 02/28/16   Jenise V Bacon Menshew, PA-C  valACYclovir (VALTREX) 500 MG tablet Take 500 mg by mouth 2 (two) times daily.  10/03/15   Historical Provider, MD    Allergies Penicillins and Tramadol  Family History  Problem Relation Age of Onset  . Heart disease Mother     Social History Social History  Substance Use Topics  . Smoking status: Former Smoker    Packs/day: 0.50    Types: Cigarettes    Quit date: 11/13/2015  . Smokeless tobacco: Current User  . Alcohol use No     Comment: socially    Review of Systems Constitutional: Fever and chills. Body aches Eyes: No visual changes. ENT: Nasal congestion. Sore  throat. Cardiovascular: Denies chest pain. Respiratory: Denies shortness of breath. Coughing Gastrointestinal: No abdominal pain.   nausea, no vomiting.  No diarrhea.  No constipation. Genitourinary: Negative for dysuria. Musculoskeletal: Negative for back pain. Skin: Negative for rash. Neurological: Positive for frontal headaches, denies focal weakness or numbness. Psychiatric:Drug abuse Endocrine:Hypertension Hematological/Lymphatic:HIV Allergic/Immunilogical: See medication this ____________________________________________   PHYSICAL EXAM:  VITAL SIGNS: ED Triage Vitals  Enc Vitals Group     BP 06/20/16 0903 (!) 157/104     Pulse Rate 06/20/16 0903 60     Resp 06/20/16 0903 18     Temp 06/20/16 0903 98.2 F (36.8 C)     Temp Source 06/20/16 0903 Oral     SpO2 06/20/16 0903 98 %     Weight 06/20/16 0903 202 lb (91.6 kg)     Height 06/20/16 0903 5\' 5"  (1.651 m)     Head Circumference --      Peak Flow --      Pain Score 06/20/16 0906 8     Pain Loc --      Pain Edu? --      Excl. in GC? --     Constitutional: Alert and oriented. Well appearing and in no acute distress. Eyes: Conjunctivae are normal. PERRL. EOMI. Head: Atraumatic. Nose: Edematous nasal turbinates and bilateral maxillary guarding . Mouth/Throat: Mucous membranes are moist.  Oropharynx erythematous. Postnasal drainage Neck: No stridor.  No cervical spine tenderness to palpation. Hematological/Lymphatic/Immunilogical: No cervical lymphadenopathy. Cardiovascular: Normal rate, regular rhythm. Grossly normal heart sounds.  Good peripheral circulation. Respiratory: Normal respiratory effort.  No retractions. Lungs CTAB. Gastrointestinal: Soft and nontender. No distention. No abdominal bruits. No CVA tenderness. Musculoskeletal: No lower extremity tenderness nor edema.  No joint effusions. Neurologic:  Normal speech and language. No gross focal neurologic deficits are appreciated. No gait  instability. Skin:  Skin is warm, dry and intact. No rash noted. Psychiatric: Mood and affect are normal. Speech and behavior are normal.  ____________________________________________   LABS (all labs ordered are listed, but only abnormal results are displayed)  Labs Reviewed  RAPID INFLUENZA A&B ANTIGENS ([facility] ONLY)   ____________________________________________  EKG   ____________________________________________  RADIOLOGY  No acute final chest x-ray. ____________________________________________   PROCEDURES  Procedure(s) performed: None  Procedures  Critical Care performed: No  ____________________________________________   INITIAL IMPRESSION / ASSESSMENT AND PLAN / ED COURSE  Pertinent labs & imaging results that were available during my care of the patient were reviewed by me and considered in my medical decision making (see chart for details).  Viral illness. Patient given discharge Instructions. Patient given a prescription for [facility] felt DM and ibuprofen. Patient advised to follow-up with family doctor condition persists. Patient given a work note for 2 days.  Clinical Course    Patient complaint generalized body aches fever and chills. Patient's had the flu shot this season. Discussed negative x-ray finding with patient.  ____________________________________________  FINAL CLINICAL IMPRESSION(S) / ED DIAGNOSES  Final diagnoses:  Flu-like symptoms      NEW MEDICATIONS STARTED DURING THIS VISIT:  New Prescriptions   BROMPHENIRAMINE-PSEUDOEPHEDRINE-DM 30-2-10 MG/5ML SYRUP    Take 5 mLs by mouth 4 (four) times daily as needed.     Note:  This document was prepared using Dragon voice recognition software and may include unintentional dictation errors.    Joni Reining, PA-C 06/20/16 1007    Myrna Blazer, MD 06/20/16 [phone]

## 2016-06-20 NOTE — Discharge Instructions (Signed)
Advised extra strength Tylenol for body aches and pain.

## 2016-06-20 NOTE — ED Notes (Signed)
Rapid flu swab performed and sent to lab.

## 2016-08-04 ENCOUNTER — Encounter: Payer: Self-pay | Admitting: Emergency Medicine

## 2016-08-04 ENCOUNTER — Emergency Department
Admission: EM | Admit: 2016-08-04 | Discharge: 2016-08-04 | Disposition: A | Discharge status: Home / Self Care | Payer: Medicaid Other | Attending: Student in an Organized Health Care Education/Training Program | Admitting: Student in an Organized Health Care Education/Training Program

## 2016-08-04 DIAGNOSIS — M7061 Trochanteric bursitis, right hip: Secondary | ICD-10-CM | POA: Insufficient documentation

## 2016-08-04 DIAGNOSIS — Z79899 Other long term (current) drug therapy: Secondary | ICD-10-CM | POA: Insufficient documentation

## 2016-08-04 DIAGNOSIS — J45909 Unspecified asthma, uncomplicated: Secondary | ICD-10-CM | POA: Diagnosis not present

## 2016-08-04 DIAGNOSIS — M1711 Unilateral primary osteoarthritis, right knee: Secondary | ICD-10-CM | POA: Insufficient documentation

## 2016-08-04 DIAGNOSIS — I10 Essential (primary) hypertension: Secondary | ICD-10-CM | POA: Insufficient documentation

## 2016-08-04 DIAGNOSIS — Y939 Activity, unspecified: Secondary | ICD-10-CM | POA: Diagnosis not present

## 2016-08-04 DIAGNOSIS — F17200 Nicotine dependence, unspecified, uncomplicated: Secondary | ICD-10-CM | POA: Insufficient documentation

## 2016-08-04 DIAGNOSIS — Z7982 Long term (current) use of aspirin: Secondary | ICD-10-CM | POA: Insufficient documentation

## 2016-08-04 DIAGNOSIS — M25561 Pain in right knee: Secondary | ICD-10-CM | POA: Diagnosis present

## 2016-08-04 MED ORDER — LIDOCAINE HCL (PF) 1 % IJ SOLN
5.0000 mL | Freq: Once | INTRAMUSCULAR | Status: AC
  Administered 2016-08-04: 5 mL
  Filled 2016-08-04: qty 5

## 2016-08-04 MED ORDER — TRIAMCINOLONE ACETONIDE 40 MG/ML IJ SUSP
40.0000 mg | Freq: Once | INTRAMUSCULAR | Status: AC
  Administered 2016-08-04: 40 mg via INTRAMUSCULAR
  Filled 2016-08-04: qty 1

## 2016-08-04 NOTE — ED Provider Notes (Signed)
[facility]-EMERGENCY DEPARTMENT Provider Note   CSN: [id number] Arrival date & time: 08/04/16  1829     History   Chief Complaint Chief Complaint  Patient presents with  . Knee Pain    HPI Diane Castillo is a 46 y.o. female presents to the emergency department for evaluation of right knee pain, right hip pain. Patient denies any new trauma or injury. She's had workup for right knee arthritis and has been scheduled for total knee arthroplasty. Messages in care everywhere show patient has been scheduled for total knee arthroplasty, has not been answering her phone or returning messages to the clinic. Patient was seen 3 weeks ago at [facility] for right hip pain, x-rays of the right hip showed no evidence of acute bony abnormality or arthropathy. She's had no new trauma over the last few weeks. Her pain is mostly in the right hip along the right trochanteric bursa. She has moderate pain with lying on her right hip at nighttime. She denies any numbness or tingling. She denies any back pain. Her pain is present throughout the right knee with swelling and giving away sensation.  HPI  Past Medical History:  Diagnosis Date  . Asthma   . Hypertension     Patient Active Problem List   Diagnosis Date Noted  . Right leg pain 11/14/2015  . Gastrocnemius muscle strain 11/14/2015  . Hypokalemia 11/14/2015  . Metabolic acidosis 11/14/2015  . Hyperglycemia 11/14/2015  . Drug abuse 11/14/2015  . Rhabdomyolysis 11/13/2015    Past Surgical History:  Procedure Laterality Date  . ABDOMINAL HYSTERECTOMY    . HAND SURGERY    . HERNIA REPAIR    . KNEE SURGERY      OB History    No data available       Home Medications    Prior to Admission medications   Medication Sig Start Date End Date Taking? Authorizing Provider  albuterol (PROVENTIL HFA) 108 (90 Base) MCG/ACT inhaler Inhale 2 puffs into the lungs every 6 (six) hours as needed. For nausea and/or vomiting. 10/03/15   Historical Provider, MD    aspirin EC 81 MG tablet Take 81 mg by mouth daily.     Historical Provider, MD  brompheniramine-pseudoephedrine-DM 30-2-10 MG/5ML syrup Take 5 mLs by mouth 4 (four) times daily as needed. 06/20/16   Joni Reining, PA-C  cyclobenzaprine (FLEXERIL) 5 MG tablet Take 1 tablet (5 mg total) by mouth 3 (three) times daily as needed for muscle spasms. 02/28/16   Jenise V Bacon Menshew, PA-C  docusate sodium (COLACE) 100 MG capsule Take 1 capsule (100 mg total) by mouth 2 (two) times daily. 11/14/15   Katharina Caper, MD  dolutegravir (TIVICAY) 50 MG tablet Take 50 mg by mouth daily. 05/16/15   Historical Provider, MD  emtricitabine-tenofovir (TRUVADA) 200-300 MG tablet Take 1 tablet by mouth daily. 05/16/15   Historical Provider, MD  gabapentin (NEURONTIN) 300 MG capsule Take 1 capsule (300 mg total) by mouth 3 (three) times daily. 02/28/16 02/27/17  Jenise V Bacon Menshew, PA-C  lidocaine (LIDODERM) 5 % Place 1 patch onto the skin every 12 (twelve) hours. Remove & Discard patch within 12 hours or as directed by MD 04/10/16   Delorise Royals Cuthriell, PA-C  meloxicam (MOBIC) 15 MG tablet Take 1 tablet (15 mg total) by mouth daily. 01/27/16   Delorise Royals Cuthriell, PA-C  oxyCODONE (OXY IR/ROXICODONE) 5 MG immediate release tablet Take 1 tablet (5 mg total) by mouth every 4 (four) hours as needed for  moderate pain, severe pain or breakthrough pain. 11/14/15   Katharina Caper, MD  oxyCODONE-acetaminophen (ROXICET) 5-325 MG tablet Take 1-2 tablets by mouth every 4 (four) hours as needed for severe pain. 02/07/16   Charmayne Sheer Beers, PA-C  oxyCODONE-acetaminophen (ROXICET) 5-325 MG tablet Take 1 tablet by mouth every 6 (six) hours as needed. 06/11/16 06/11/17  Joni Reining, PA-C  traMADol (ULTRAM) 50 MG tablet Take 2 tablets (100 mg total) by mouth 3 (three) times daily as needed. 02/28/16   Jenise V Bacon Menshew, PA-C  valACYclovir (VALTREX) 500 MG tablet Take 500 mg by mouth 2 (two) times daily. 10/03/15   Historical Provider, MD     Family History Family History  Problem Relation Age of Onset  . Heart disease Mother     Social History Social History  Substance Use Topics  . Smoking status: Former Smoker    Packs/day: 0.10    Types: Cigarettes    Quit date: 11/13/2015  . Smokeless tobacco: Current User  . Alcohol use No     Comment: socially     Allergies   Penicillins and Tramadol   Review of Systems Review of Systems  Constitutional: Negative for activity change, chills, fatigue and fever.  HENT: Negative for congestion, sinus pressure and sore throat.   Eyes: Negative for visual disturbance.  Respiratory: Negative for cough, chest tightness and shortness of breath.   Cardiovascular: Negative for chest pain and leg swelling.  Gastrointestinal: Negative for abdominal pain, diarrhea, nausea and vomiting.  Genitourinary: Negative for dysuria.  Musculoskeletal: Positive for arthralgias. Negative for back pain and gait problem.  Skin: Negative for rash.  Neurological: Negative for weakness, numbness and headaches.  Hematological: Negative for adenopathy.  Psychiatric/Behavioral: Negative for agitation, behavioral problems and confusion.     Physical Exam Updated Vital Signs BP (!) 146/93 (BP Location: Left Wrist)   Pulse 64   Temp 98.3 F (36.8 C) (Oral)   Resp 18   Ht 5\' 6"  (1.676 m)   Wt 83 kg   LMP 11/23/2014   SpO2 100%   BMI 29.54 kg/m   Physical Exam  Constitutional: She appears well-developed and well-nourished. No distress.  HENT:  Head: Normocephalic and atraumatic.  Eyes: Conjunctivae are normal.  Neck: Neck supple.  Cardiovascular: Normal rate and regular rhythm.   No murmur heard. Pulmonary/Chest: Effort normal and breath sounds normal. No respiratory distress.  Abdominal: Soft. There is no tenderness.  Musculoskeletal:  Lumbar Spine: Examination of the lumbar spine reveals no bony abnormality, no edema, and no ecchymosis.  There is no step off.  The patient has full  range of motion of the lumbar spine with flexion and extension.  The patient has normal lateral bend and rotation.  The patient has no pain with range of motion activities.  The patient is non tender along the spinous process.  The patient is non tender along the paravertebral muscles, with no muscle spasms.  The patient is non tender along the iliac crest.  The patient is non tender in the sciatic notch.  The patient is non tender along the Sacroiliac joint.  There is no Coccyx joint tenderness.    Bilateral Lower Extremities: Examination of the lower extremities reveals no bony abnormality, no edema, and no ecchymosis.  The patient has full active and passive range of motion of the hips, knees, and ankles.  There is no discomfort with range of motion exercises.  The patient is severely tender along the right trochanteric bursa and  non- tender along the left greater trochanter region.  The patient has a negative Denna HaggardHomans' test bilaterally.  There is normal skin warmth.  There is normal capillary refill bilaterally.  Patient has mild swelling throughout the right knee with no warmth or redness. She has negative Homans sign bilaterally.  Neurologic: The patient has a negative straight leg raise.  The patient has normal muscle strength testing for the quadriceps, calves, ankle dorsiflexion, ankle plantarflexion, and extensor hallicus longus.  The patient has sensation that is intact to light touch.    Neurological: She is alert.  Skin: Skin is warm and dry.  Psychiatric: She has a normal mood and affect.  Nursing note and vitals reviewed.    ED Treatments / Results  Labs (all labs ordered are listed, but only abnormal results are displayed) Labs Reviewed - No data to display  EKG  EKG Interpretation None       Radiology No results found.  Procedures Procedures (including critical care time) Right hip trochanteric bursa injection: Patient agreed and consented to a right hip trochanteric  bursa injection. Skin was prepped with chlorhexidine and alcohol, a 22-gauge needle was injected into the trochanteric bursa and a bolus solution of 5 cc 1% lidocaine and 1 cc of Kenalog 40 mg was injected into the trochanteric bursa. Patient tolerated procedure well Band-Aid was applied.  Medications Ordered in ED Medications  lidocaine (PF) (XYLOCAINE) 1 % injection 5 mL (5 mLs Infiltration Given by Other 08/04/16 1931)  triamcinolone acetonide (KENALOG-40) injection 40 mg (40 mg Intramuscular Given by Other 08/04/16 1932)     Initial Impression / Assessment and Plan / ED Course  I have reviewed the triage vital signs and the nursing notes.  Pertinent labs & imaging results that were available during my care of the patient were reviewed by me and considered in my medical decision making (see chart for details).     46 year old female with continued right hip pain and right knee pain. Right hip consistent with trochanteric bursitis. Recent x-rays from 3 weeks ago showed no acute bony abnormality or arthropathy. She has no neurological deficits on exam. Patient also with chronic right knee pain, she has been seen and evaluated by orthopedics as scheduled for total knee arthroplasty but has had difficulty communicating with the clinic to schedule a date. Patient will rest, avoid physical activity, avoid lying on the right hip at nighttime. Call orthopedic office first thing Monday morning.   Final Clinical Impressions(s) / ED Diagnoses   Final diagnoses:  Arthritis of knee, right  Greater trochanteric bursitis, right    New Prescriptions New Prescriptions   No medications on file     Evon Slackhomas C Brenin Heidelberger, PA-C 08/04/16 2017    Willy EddyPatrick Robinson, MD 08/04/16 2253

## 2016-08-04 NOTE — ED Triage Notes (Signed)
Increased R knee pain x 3 days. Denies any new injury. States she needs a replacement that knee.

## 2016-08-04 NOTE — ED Notes (Signed)

## 2016-08-04 NOTE — Discharge Instructions (Signed)
Please avoid lying on the right hip at nighttime. Follow-up with your orthopedist in 5-7 days for recheck. Return to the ER for any worsening symptoms urgent changes in her health.

## 2016-09-04 ENCOUNTER — Encounter: Payer: Self-pay | Admitting: Medical Oncology

## 2016-09-04 ENCOUNTER — Emergency Department
Admission: EM | Admit: 2016-09-04 | Discharge: 2016-09-04 | Disposition: A | Discharge status: Home / Self Care | Payer: Medicaid Other | Attending: Emergency Medicine | Admitting: Emergency Medicine

## 2016-09-04 DIAGNOSIS — Z79899 Other long term (current) drug therapy: Secondary | ICD-10-CM | POA: Diagnosis not present

## 2016-09-04 DIAGNOSIS — M1711 Unilateral primary osteoarthritis, right knee: Secondary | ICD-10-CM | POA: Diagnosis not present

## 2016-09-04 DIAGNOSIS — J45909 Unspecified asthma, uncomplicated: Secondary | ICD-10-CM | POA: Insufficient documentation

## 2016-09-04 DIAGNOSIS — F17200 Nicotine dependence, unspecified, uncomplicated: Secondary | ICD-10-CM | POA: Insufficient documentation

## 2016-09-04 DIAGNOSIS — Z7982 Long term (current) use of aspirin: Secondary | ICD-10-CM | POA: Insufficient documentation

## 2016-09-04 DIAGNOSIS — M25561 Pain in right knee: Secondary | ICD-10-CM | POA: Diagnosis present

## 2016-09-04 DIAGNOSIS — I10 Essential (primary) hypertension: Secondary | ICD-10-CM | POA: Diagnosis not present

## 2016-09-04 MED ORDER — MORPHINE SULFATE (PF) 4 MG/ML IV SOLN
4.0000 mg | Freq: Once | INTRAVENOUS | Status: AC
  Administered 2016-09-04: 4 mg via INTRAMUSCULAR
  Filled 2016-09-04: qty 1

## 2016-09-04 NOTE — ED Triage Notes (Signed)
Chronic rt knee pain x 12 years, pain worsened.

## 2016-09-04 NOTE — ED Notes (Addendum)
See triage note  [facility]  Ha shad right ACL repair in pain but told she needs total knee replacement  Currently taking mobic w/o relief

## 2016-09-04 NOTE — ED Provider Notes (Signed)
[facility] Emergency Department Provider Note  ____________________________________________  Time seen: Approximately 4:37 PM  I have reviewed the triage vital signs and the nursing notes.   HISTORY  Chief Complaint Knee Pain    HPI Diane Castillo is a 46 y.o. female who presents emergency department complaining of right knee pain. Patient has had chronic right knee pain that has increased over the past several days. Patient has been evaluated by this department as well as [facility] multiple times for acute on chronic knee pain. Patient denies any trauma or injury to the knee at this time. She reports that no significant changes in weather have exacerbated an underlying condition. Patient states that she has known tricompartmental severe osteoarthritis requiring total knee replacement. Patient reports that she was seeing orthopedics but has since tried to contact orthopedics with no return phone calls to schedule her surgery. After lengthy discussion and checking Epic, patient was attempting to contact [facility] orthopedics for her surgery however she is seeing [facility]. After clarification, patient states that she will contact [facility] clinic tomorrow morning to schedule surgery. We had a lengthy discussion about prescribing narcotics, patient states that she understands at this time she is unable to receive a narcotic prescription but states that after clearing up orthopedic group, she feels confident she will be a little alleviate her symptoms with the surgical option.Patient is on daily meloxicam, has used Voltaren gel as well as Lidoderm patches. Patient is also attempting to use both hot water bottles as well as ice packs to alleviate symptoms.   Past Medical History:  Diagnosis Date  . Asthma   . Hypertension     Patient Active Problem List   Diagnosis Date Noted  . Right leg pain 11/14/2015  . Gastrocnemius muscle strain 11/14/2015  .  Hypokalemia 11/14/2015  . Metabolic acidosis 11/14/2015  . Hyperglycemia 11/14/2015  . Drug abuse 11/14/2015  . Rhabdomyolysis 11/13/2015    Past Surgical History:  Procedure Laterality Date  . ABDOMINAL HYSTERECTOMY    . HAND SURGERY    . HERNIA REPAIR    . KNEE SURGERY      Prior to Admission medications   Medication Sig Start Date End Date Taking? Authorizing Provider  albuterol (PROVENTIL HFA) 108 (90 Base) MCG/ACT inhaler Inhale 2 puffs into the lungs every 6 (six) hours as needed. For nausea and/or vomiting. 10/03/15   Historical Provider, MD  aspirin EC 81 MG tablet Take 81 mg by mouth daily.     Historical Provider, MD  brompheniramine-pseudoephedrine-DM 30-2-10 MG/5ML syrup Take 5 mLs by mouth 4 (four) times daily as needed. 06/20/16   Joni Reining, PA-C  cyclobenzaprine (FLEXERIL) 5 MG tablet Take 1 tablet (5 mg total) by mouth 3 (three) times daily as needed for muscle spasms. 02/28/16   Jenise V Bacon Menshew, PA-C  docusate sodium (COLACE) 100 MG capsule Take 1 capsule (100 mg total) by mouth 2 (two) times daily. 11/14/15   Katharina Caper, MD  dolutegravir (TIVICAY) 50 MG tablet Take 50 mg by mouth daily. 05/16/15   Historical Provider, MD  emtricitabine-tenofovir (TRUVADA) 200-300 MG tablet Take 1 tablet by mouth daily. 05/16/15   Historical Provider, MD  gabapentin (NEURONTIN) 300 MG capsule Take 1 capsule (300 mg total) by mouth 3 (three) times daily. 02/28/16 02/27/17  Jenise V Bacon Menshew, PA-C  lidocaine (LIDODERM) 5 % Place 1 patch onto the skin every 12 (twelve) hours. Remove & Discard patch within 12 hours or as directed by  MD 04/10/16   Delorise RoyalsJonathan D Cuthriell, PA-C  meloxicam (MOBIC) 15 MG tablet Take 1 tablet (15 mg total) by mouth daily. 01/27/16   Delorise RoyalsJonathan D Cuthriell, PA-C  oxyCODONE (OXY IR/ROXICODONE) 5 MG immediate release tablet Take 1 tablet (5 mg total) by mouth every 4 (four) hours as needed for moderate pain, severe pain or breakthrough pain. 11/14/15   Katharina Caperima  [place], MD  oxyCODONE-acetaminophen (ROXICET) 5-325 MG tablet Take 1-2 tablets by mouth every 4 (four) hours as needed for severe pain. 02/07/16   Charmayne Sheerharles M Beers, PA-C  oxyCODONE-acetaminophen (ROXICET) 5-325 MG tablet Take 1 tablet by mouth every 6 (six) hours as needed. 06/11/16 06/11/17  Joni Reiningonald K Smith, PA-C  traMADol (ULTRAM) 50 MG tablet Take 2 tablets (100 mg total) by mouth 3 (three) times daily as needed. 02/28/16   Jenise V Bacon Menshew, PA-C  valACYclovir (VALTREX) 500 MG tablet Take 500 mg by mouth 2 (two) times daily. 10/03/15   Historical Provider, MD    Allergies Penicillins and Tramadol  Family History  Problem Relation Age of Onset  . Heart disease Mother     Social History Social History  Substance Use Topics  . Smoking status: Former Smoker    Packs/day: 0.10    Types: Cigarettes    Quit date: 11/13/2015  . Smokeless tobacco: Current User  . Alcohol use No     Comment: socially     Review of Systems  Constitutional: No fever/chills Cardiovascular: no chest pain. Respiratory: no cough. No SOB. Gastrointestinal: No abdominal pain.  No nausea, no vomiting.   Musculoskeletal: Positive for acute on chronic R knee pain Skin: Negative for rash, abrasions, lacerations, ecchymosis. Neurological: Negative for headaches, focal weakness or numbness. 10-point ROS otherwise negative.  ____________________________________________   PHYSICAL EXAM:  VITAL SIGNS: ED Triage Vitals  Enc Vitals Group     BP 09/04/16 1546 (!) 148/80     Pulse Rate 09/04/16 1546 60     Resp 09/04/16 1546 18     Temp 09/04/16 1546 98.3 F (36.8 C)     Temp Source 09/04/16 1546 Oral     SpO2 09/04/16 1546 98 %     Weight 09/04/16 1542 200 lb (90.7 kg)     Height 09/04/16 1542 5\' 5"  (1.651 m)     Head Circumference --      Peak Flow --      Pain Score 09/04/16 1542 10     Pain Loc --      Pain Edu? --      Excl. in GC? --      Constitutional: Alert and oriented. Well appearing  and in no acute distress. Eyes: Conjunctivae are normal. PERRL. EOMI. Head: Atraumatic. Neck: No stridor.    Cardiovascular: Normal rate, regular rhythm. Normal S1 and S2.  Good peripheral circulation. Respiratory: Normal respiratory effort without tachypnea or retractions. Lungs CTAB. Good air entry to the bases with no decreased or absent breath sounds. Musculoskeletal: Full range of motion to all extremities. No gross deformities appreciated.No gross deformities or edema to right knee when compared with left. Full range of motion to knee. Patient is mildly tender to palpation over the anterior aspect knee. No specific point tenderness. Stress testing of the knee is unremarkable. Dorsalis pedis pulse intact distally. Sensation intact distally. Neurologic:  Normal speech and language. No gross focal neurologic deficits are appreciated.  Skin:  Skin is warm, dry and intact. No rash noted. Psychiatric: Mood and affect are normal. Speech and  behavior are normal. Patient exhibits appropriate insight and judgement.   ____________________________________________   LABS (all labs ordered are listed, but only abnormal results are displayed)  Labs Reviewed - No data to display ____________________________________________  EKG   ____________________________________________  RADIOLOGY   No results found.  ____________________________________________    PROCEDURES  Procedure(s) performed:    Procedures    Medications  morphine 4 MG/ML injection 4 mg (4 mg Intramuscular Given 09/04/16 1645)     ____________________________________________   INITIAL IMPRESSION / ASSESSMENT AND PLAN / ED COURSE  Pertinent labs & imaging results that were available during my care of the patient were reviewed by me and considered in my medical decision making (see chart for details).  Review of the  [place]RS was performed in accordance of the [facility] prior to dispensing any controlled drugs.      Patient's diagnosis is consistent with acute on chronic right knee pain from severe osteoarthritis. Patient has tricompartmental osteoarthritis to the right knee. She has been evaluated by orthopedics and is in need of a total knee replacement. Patient became confused on her orthopedic group and believes she was being followed by [facility] when in fact she was being followed by [facility]. After clarification, the patient states that she will contact orthopedics in the morning for scheduling for surgery. We had a lengthy discussion about narcotic use, unfortunately patient has received multiple narcotic prescriptions from multiple providers over the past year. I advised patient at this time is unable to fill a narcotic prescription, patient was very understanding and states that "I think I can wait a few more weeks for surgery to actually fix this problem." Patient is on chronic anti-inflammatory therapy and as such, I will give patient narcotic injection in the emergency department in lieu of of anti-inflammatory injection.. Patient will continue current anti-inflammatory therapy at home and follow-up with orthopedics for scheduling a total knee replacement. Patient is given ED precautions to return to the ED for any worsening or new symptoms.     ____________________________________________  FINAL CLINICAL IMPRESSION(S) / ED DIAGNOSES  Final diagnoses:  Osteoarthritis of right knee, unspecified osteoarthritis type      NEW MEDICATIONS STARTED DURING THIS VISIT:  New Prescriptions   No medications on file        This chart was dictated using voice recognition software/Dragon. Despite best efforts to proofread, errors can occur which can change the meaning. Any change was purely unintentional.    Racheal Patches, PA-C 09/04/16 1647    Jeanmarie Plant, MD 09/04/16 2020

## 2016-09-23 ENCOUNTER — Emergency Department: Payer: Medicaid Other

## 2016-09-23 ENCOUNTER — Emergency Department
Admission: EM | Admit: 2016-09-23 | Discharge: 2016-09-24 | Disposition: A | Discharge status: Home / Self Care | Payer: Medicaid Other | Attending: Emergency Medicine | Admitting: Emergency Medicine

## 2016-09-23 DIAGNOSIS — J45909 Unspecified asthma, uncomplicated: Secondary | ICD-10-CM | POA: Insufficient documentation

## 2016-09-23 DIAGNOSIS — Y999 Unspecified external cause status: Secondary | ICD-10-CM | POA: Diagnosis not present

## 2016-09-23 DIAGNOSIS — F17200 Nicotine dependence, unspecified, uncomplicated: Secondary | ICD-10-CM | POA: Insufficient documentation

## 2016-09-23 DIAGNOSIS — W2203XA Walked into furniture, initial encounter: Secondary | ICD-10-CM | POA: Diagnosis not present

## 2016-09-23 DIAGNOSIS — S8991XA Unspecified injury of right lower leg, initial encounter: Secondary | ICD-10-CM | POA: Diagnosis present

## 2016-09-23 DIAGNOSIS — Z79899 Other long term (current) drug therapy: Secondary | ICD-10-CM | POA: Diagnosis not present

## 2016-09-23 DIAGNOSIS — M1711 Unilateral primary osteoarthritis, right knee: Secondary | ICD-10-CM | POA: Insufficient documentation

## 2016-09-23 DIAGNOSIS — Y929 Unspecified place or not applicable: Secondary | ICD-10-CM | POA: Diagnosis not present

## 2016-09-23 DIAGNOSIS — Y939 Activity, unspecified: Secondary | ICD-10-CM | POA: Insufficient documentation

## 2016-09-23 DIAGNOSIS — Z7982 Long term (current) use of aspirin: Secondary | ICD-10-CM | POA: Diagnosis not present

## 2016-09-23 DIAGNOSIS — I10 Essential (primary) hypertension: Secondary | ICD-10-CM | POA: Diagnosis not present

## 2016-09-23 MED ORDER — KETOROLAC TROMETHAMINE 60 MG/2ML IM SOLN
30.0000 mg | Freq: Once | INTRAMUSCULAR | Status: AC
Start: 2016-09-23 — End: 2016-09-23
  Administered 2016-09-23: 30 mg via INTRAMUSCULAR
  Filled 2016-09-23: qty 2

## 2016-09-23 MED ORDER — ACETAMINOPHEN-CODEINE #3 300-30 MG PO TABS
1.0000 | ORAL_TABLET | Freq: Once | ORAL | Status: DC
  Filled 2016-09-23: qty 1

## 2016-09-23 MED ORDER — OXYCODONE-ACETAMINOPHEN 7.5-325 MG PO TABS
1.0000 | ORAL_TABLET | Freq: Once | ORAL | Status: AC
  Administered 2016-09-23: 1 via ORAL
  Filled 2016-09-23: qty 1

## 2016-09-23 NOTE — ED Triage Notes (Signed)
Pt reports to ED w/ R knee pain.  Pt sts that her knee "always" hurts but worse today because she hit table with it.  Pt alert and oriented, resp even and unlabored. Pt drinking lemonade w/o issue, sts pain 10/10 while on phone playing Candy Crush. NAD

## 2016-09-23 NOTE — ED Provider Notes (Signed)
[facility] Emergency Department Provider Note  ____________________________________________  Time seen: Approximately 11:22 PM  I have reviewed the triage vital signs and the nursing notes.   HISTORY  Chief Complaint Knee Pain    HPI Diane Castillo is a 46 y.o. female that presents to emergency department with right knee pain after bumping knee on an end table yesterday. Patient states that it has been painful to walk since injury. She states that it is sore in the front of her knee. Pain does not radiate. She took an oxycodone today at 2 PM. She denies any additional injuries. She is scheduled for total knee replacement on May 15. She denies fever, shortness of breath, chest pain, nausea, vomiting, abdominal pain, numbness, tingling.   Past Medical History:  Diagnosis Date  . Asthma   . Hypertension     Patient Active Problem List   Diagnosis Date Noted  . Right leg pain 11/14/2015  . Gastrocnemius muscle strain 11/14/2015  . Hypokalemia 11/14/2015  . Metabolic acidosis 11/14/2015  . Hyperglycemia 11/14/2015  . Drug abuse 11/14/2015  . Rhabdomyolysis 11/13/2015    Past Surgical History:  Procedure Laterality Date  . ABDOMINAL HYSTERECTOMY    . HAND SURGERY    . HERNIA REPAIR    . KNEE SURGERY      Prior to Admission medications   Medication Sig Start Date End Date Taking? Authorizing Provider  albuterol (PROVENTIL HFA) 108 (90 Base) MCG/ACT inhaler Inhale 2 puffs into the lungs every 6 (six) hours as needed. For nausea and/or vomiting. 10/03/15   Historical Provider, MD  aspirin EC 81 MG tablet Take 81 mg by mouth daily.     Historical Provider, MD  brompheniramine-pseudoephedrine-DM 30-2-10 MG/5ML syrup Take 5 mLs by mouth 4 (four) times daily as needed. 06/20/16   Joni Reining, PA-C  cyclobenzaprine (FLEXERIL) 5 MG tablet Take 1 tablet (5 mg total) by mouth 3 (three) times daily as needed for muscle spasms. 02/28/16   Jenise V Bacon Menshew,  PA-C  docusate sodium (COLACE) 100 MG capsule Take 1 capsule (100 mg total) by mouth 2 (two) times daily. 11/14/15   Katharina Caper, MD  dolutegravir (TIVICAY) 50 MG tablet Take 50 mg by mouth daily. 05/16/15   Historical Provider, MD  emtricitabine-tenofovir (TRUVADA) 200-300 MG tablet Take 1 tablet by mouth daily. 05/16/15   Historical Provider, MD  gabapentin (NEURONTIN) 300 MG capsule Take 1 capsule (300 mg total) by mouth 3 (three) times daily. 02/28/16 02/27/17  Jenise V Bacon Menshew, PA-C  lidocaine (LIDODERM) 5 % Place 1 patch onto the skin every 12 (twelve) hours. Remove & Discard patch within 12 hours or as directed by MD 04/10/16   Delorise Royals Cuthriell, PA-C  meloxicam (MOBIC) 15 MG tablet Take 1 tablet (15 mg total) by mouth daily. 01/27/16   Delorise Royals Cuthriell, PA-C  oxyCODONE (OXY IR/ROXICODONE) 5 MG immediate release tablet Take 1 tablet (5 mg total) by mouth every 4 (four) hours as needed for moderate pain, severe pain or breakthrough pain. 11/14/15   Katharina Caper, MD  oxyCODONE-acetaminophen (ROXICET) 5-325 MG tablet Take 1-2 tablets by mouth every 4 (four) hours as needed for severe pain. 02/07/16   Charmayne Sheer Beers, PA-C  oxyCODONE-acetaminophen (ROXICET) 5-325 MG tablet Take 1 tablet by mouth every 6 (six) hours as needed. 06/11/16 06/11/17  Joni Reining, PA-C  traMADol (ULTRAM) 50 MG tablet Take 2 tablets (100 mg total) by mouth 3 (three) times daily as needed. 02/28/16  Jenise V Bacon Menshew, PA-C  valACYclovir (VALTREX) 500 MG tablet Take 500 mg by mouth 2 (two) times daily. 10/03/15   Historical Provider, MD    Allergies Penicillins and Tramadol  Family History  Problem Relation Age of Onset  . Heart disease Mother     Social History Social History  Substance Use Topics  . Smoking status: Former Smoker    Packs/day: 0.10    Types: Cigarettes    Quit date: 11/13/2015  . Smokeless tobacco: Current User  . Alcohol use No     Comment: socially     Review of Systems   Constitutional: No fever/chills ENT: No upper respiratory complaints. Cardiovascular: No chest pain. Respiratory: No cough. No SOB. Gastrointestinal: No abdominal pain.  No nausea, no vomiting.  Musculoskeletal: Positive for knee pain. Skin: Negative for rash, abrasions, lacerations, ecchymosis. Neurological: Negative for headaches, numbness or tingling   ____________________________________________   PHYSICAL EXAM:  VITAL SIGNS: ED Triage Vitals [09/23/16 2059]  Enc Vitals Group     BP (!) 141/91     Pulse Rate 64     Resp 18     Temp 97.7 F (36.5 C)     Temp Source Oral     SpO2 99 %     Weight 200 lb (90.7 kg)     Height  (1.651 m)     Head Circumference      Peak Flow      Pain Score 10     Pain Loc      Pain Edu?      Excl. in GC?      Constitutional: Alert and oriented. Well appearing and in no acute distress. Eyes: Conjunctivae are normal. PERRL. EOMI. Head: Atraumatic. ENT:      Ears:      Nose: No congestion/rhinnorhea.      Mouth/Throat: Mucous membranes are moist.  Neck: No stridor.  Cardiovascular: Normal rate, regular rhythm.  Good peripheral circulation. Respiratory: Normal respiratory effort without tachypnea or retractions. Lungs CTAB. Good air entry to the bases with no decreased or absent breath sounds. Musculoskeletal: Full range of motion to all extremities. No gross deformities appreciated. Tender to palpation at the inferior pole of patella. No calf tenderness. No swelling. Neurologic:  Normal speech and language. No gross focal neurologic deficits are appreciated.  Skin:  Skin is warm, dry and intact. No rash noted.   ____________________________________________   LABS (all labs ordered are listed, but only abnormal results are displayed)  Labs Reviewed - No data to display ____________________________________________  EKG   ____________________________________________  RADIOLOGY Lexine Baton, personally viewed and  evaluated these images (plain radiographs) as part of my medical decision making, as well as reviewing the written report by the radiologist.  Dg Knee Complete 4 Views Right  Result Date: 09/23/2016 CLINICAL DATA:  Hit right knee on table. Right knee pain. Initial encounter. EXAM: RIGHT KNEE - COMPLETE 4+ VIEW COMPARISON:  05/08/2016 FINDINGS: No evidence of acute fracture or dislocation. A small knee joint effusion is seen. Moderate tricompartmental osteoarthritis is again demonstrated. Stable postop changes from previous ACL repair. IMPRESSION: Small knee joint effusion.  No evidence of fracture or dislocation. Moderate tricompartmental osteoarthritis . Electronically Signed   By: Myles Rosenthal M.D.   On: 09/23/2016 21:59    ____________________________________________    PROCEDURES  Procedure(s) performed:    Procedures    Medications  acetaminophen-codeine (TYLENOL #3) 300-30 MG per tablet 1 tablet (1 tablet Oral Not Given  09/23/16 2259)  ketorolac (TORADOL) injection 30 mg (30 mg Intramuscular Given 09/23/16 2320)  oxyCODONE-acetaminophen (PERCOCET) 7.5-325 MG per tablet 1 tablet (1 tablet Oral Given 09/23/16 2349)     ____________________________________________   INITIAL IMPRESSION / ASSESSMENT AND PLAN / ED COURSE  Pertinent labs & imaging results that were available during my care of the patient were reviewed by me and considered in my medical decision making (see chart for details).  Review of the [place] CSRS was performed in accordance of the [facility] prior to dispensing any controlled drugs.  Patient's diagnosis is consistent with severe osteoarthritis. Vital signs and exam are reassuring. X-ray negative for acute bony abnormalities. Patient is scheduled for a total knee replacement on May 15. Patient was given a Toradol shot and a Percocet in ED. Patient is going to call orthopedics in the morning for pain medication. Patient is given ED precautions to return to the ED for any  worsening or new symptoms.   ____________________________________________  FINAL CLINICAL IMPRESSION(S) / ED DIAGNOSES  Final diagnoses:  Osteoarthritis of right knee, unspecified osteoarthritis type      NEW MEDICATIONS STARTED DURING THIS VISIT:  New Prescriptions   No medications on file        This chart was dictated using voice recognition software/Dragon. Despite best efforts to proofread, errors can occur which can change the meaning. Any change was purely unintentional.    Enid Derry, PA-C 09/23/16 2357    Merrily Brittle, MD 09/26/16 1058

## 2016-09-25 ENCOUNTER — Encounter: Payer: Self-pay | Admitting: Emergency Medicine

## 2016-09-25 ENCOUNTER — Emergency Department
Admission: EM | Admit: 2016-09-25 | Discharge: 2016-09-25 | Disposition: A | Discharge status: Home / Self Care | Payer: Medicaid Other | Attending: Emergency Medicine | Admitting: Emergency Medicine

## 2016-09-25 DIAGNOSIS — Z79899 Other long term (current) drug therapy: Secondary | ICD-10-CM | POA: Diagnosis not present

## 2016-09-25 DIAGNOSIS — Z7982 Long term (current) use of aspirin: Secondary | ICD-10-CM | POA: Insufficient documentation

## 2016-09-25 DIAGNOSIS — E119 Type 2 diabetes mellitus without complications: Secondary | ICD-10-CM | POA: Insufficient documentation

## 2016-09-25 DIAGNOSIS — J45909 Unspecified asthma, uncomplicated: Secondary | ICD-10-CM | POA: Insufficient documentation

## 2016-09-25 DIAGNOSIS — I10 Essential (primary) hypertension: Secondary | ICD-10-CM | POA: Diagnosis not present

## 2016-09-25 DIAGNOSIS — F17200 Nicotine dependence, unspecified, uncomplicated: Secondary | ICD-10-CM | POA: Diagnosis not present

## 2016-09-25 DIAGNOSIS — M1711 Unilateral primary osteoarthritis, right knee: Secondary | ICD-10-CM | POA: Insufficient documentation

## 2016-09-25 DIAGNOSIS — M25561 Pain in right knee: Secondary | ICD-10-CM | POA: Diagnosis present

## 2016-09-25 MED ORDER — OXYCODONE-ACETAMINOPHEN 5-325 MG PO TABS: 1.0000 | ORAL_TABLET | Freq: Four times a day (QID) | ORAL | 0 refills | Status: DC | PRN

## 2016-09-25 MED ORDER — HYDROMORPHONE HCL 1 MG/ML IJ SOLN
1.0000 mg | Freq: Once | INTRAMUSCULAR | Status: AC
  Administered 2016-09-25: 1 mg via INTRAMUSCULAR
  Filled 2016-09-25: qty 1

## 2016-09-25 MED ORDER — KETOROLAC TROMETHAMINE 60 MG/2ML IM SOLN
60.0000 mg | Freq: Once | INTRAMUSCULAR | Status: AC
  Administered 2016-09-25: 60 mg via INTRAMUSCULAR
  Filled 2016-09-25: qty 2

## 2016-09-25 MED ORDER — OXYCODONE-ACETAMINOPHEN 5-325 MG PO TABS
1.0000 | ORAL_TABLET | Freq: Once | ORAL | Status: AC
  Administered 2016-09-25: 1 via ORAL
  Filled 2016-09-25: qty 1

## 2016-09-25 NOTE — ED Triage Notes (Signed)
Brought in via ems from home with right knee pain  States she is scheduled for surgery on 5/15  But states pain has increased   Unable to bear wt

## 2016-09-25 NOTE — Discharge Instructions (Signed)
Advised to contact orthopedic clinic to discuss pain management pending surgery.

## 2016-09-25 NOTE — ED Notes (Signed)
States she has not gotten any relief from pain    Provider aware

## 2016-09-25 NOTE — ED Provider Notes (Signed)
[facility] Emergency Department Provider Note   ____________________________________________   First MD Initiated Contact with Patient 09/25/16 [phone]     (approximate)  I have reviewed the triage vital signs and the nursing notes.   HISTORY  Chief Complaint Knee Pain    HPI Diane Castillo is a 46 y.o. female patient complaining of acute knee pain. Patient arrived via EMS stating her pain is increased since the visit 2 days ago. Patient states called Orthopedic Department as directed for pain management but they have not returned her call. Patient is pending right knee replacement on 09/30/2016. Patient states today she is unable to bear weight secondary to the pain. Patient is rating her pain as a 10 over 10. Patient described a pain as "sharp".   Past Medical History:  Diagnosis Date  . Asthma   . Hypertension     Patient Active Problem List   Diagnosis Date Noted  . Right leg pain 11/14/2015  . Gastrocnemius muscle strain 11/14/2015  . Hypokalemia 11/14/2015  . Metabolic acidosis 11/14/2015  . Hyperglycemia 11/14/2015  . Drug abuse 11/14/2015  . Rhabdomyolysis 11/13/2015    Past Surgical History:  Procedure Laterality Date  . ABDOMINAL HYSTERECTOMY    . HAND SURGERY    . HERNIA REPAIR    . KNEE SURGERY      Prior to Admission medications   Medication Sig Start Date End Date Taking? Authorizing Provider  albuterol (PROVENTIL HFA) 108 (90 Base) MCG/ACT inhaler Inhale 2 puffs into the lungs every 6 (six) hours as needed. For nausea and/or vomiting. 10/03/15   Historical Provider, MD  aspirin EC 81 MG tablet Take 81 mg by mouth daily.     Historical Provider, MD  brompheniramine-pseudoephedrine-DM 30-2-10 MG/5ML syrup Take 5 mLs by mouth 4 (four) times daily as needed. 06/20/16   Joni Reining, PA-C  cyclobenzaprine (FLEXERIL) 5 MG tablet Take 1 tablet (5 mg total) by mouth 3 (three) times daily as needed for muscle spasms. 02/28/16   Jenise V  Bacon Menshew, PA-C  docusate sodium (COLACE) 100 MG capsule Take 1 capsule (100 mg total) by mouth 2 (two) times daily. 11/14/15   Katharina Caper, MD  dolutegravir (TIVICAY) 50 MG tablet Take 50 mg by mouth daily. 05/16/15   Historical Provider, MD  emtricitabine-tenofovir (TRUVADA) 200-300 MG tablet Take 1 tablet by mouth daily. 05/16/15   Historical Provider, MD  gabapentin (NEURONTIN) 300 MG capsule Take 1 capsule (300 mg total) by mouth 3 (three) times daily. 02/28/16 02/27/17  Jenise V Bacon Menshew, PA-C  lidocaine (LIDODERM) 5 % Place 1 patch onto the skin every 12 (twelve) hours. Remove & Discard patch within 12 hours or as directed by MD 04/10/16   Delorise Royals Cuthriell, PA-C  meloxicam (MOBIC) 15 MG tablet Take 1 tablet (15 mg total) by mouth daily. 01/27/16   Delorise Royals Cuthriell, PA-C  oxyCODONE (OXY IR/ROXICODONE) 5 MG immediate release tablet Take 1 tablet (5 mg total) by mouth every 4 (four) hours as needed for moderate pain, severe pain or breakthrough pain. 11/14/15   Katharina Caper, MD  oxyCODONE-acetaminophen (ROXICET) 5-325 MG tablet Take 1-2 tablets by mouth every 4 (four) hours as needed for severe pain. 02/07/16   Charmayne Sheer Beers, PA-C  oxyCODONE-acetaminophen (ROXICET) 5-325 MG tablet Take 1 tablet by mouth every 6 (six) hours as needed. 06/11/16 06/11/17  Joni Reining, PA-C  oxyCODONE-acetaminophen (ROXICET) 5-325 MG tablet Take 1 tablet by mouth every 6 (six) hours as  needed for moderate pain. 09/25/16   Joni Reining, PA-C  traMADol (ULTRAM) 50 MG tablet Take 2 tablets (100 mg total) by mouth 3 (three) times daily as needed. 02/28/16   Jenise V Bacon Menshew, PA-C  valACYclovir (VALTREX) 500 MG tablet Take 500 mg by mouth 2 (two) times daily. 10/03/15   Historical Provider, MD    Allergies Penicillins and Tramadol  Family History  Problem Relation Age of Onset  . Heart disease Mother     Social History Social History  Substance Use Topics  . Smoking status: Former Smoker      Packs/day: 0.10    Types: Cigarettes    Quit date: 11/13/2015  . Smokeless tobacco: Current User  . Alcohol use No     Comment: socially    Review of Systems Constitutional: No fever/chills Eyes: No visual changes. ENT: No sore throat. Cardiovascular: Denies chest pain. Respiratory: Denies shortness of breath. Gastrointestinal: No abdominal pain.  No nausea, no vomiting.  No diarrhea.  No constipation. Genitourinary: Negative for dysuria. Musculoskeletal: Negative for back pain. Skin: Negative for rash. Neurological: Negative for headaches, focal weakness or numbness. Psychiatric:Drug abuse Endocrine:Diabetes and hypertension. Allergic/Immunilogical: Penicillin and tramadol   ____________________________________________   PHYSICAL EXAM:  VITAL SIGNS: ED Triage Vitals  Enc Vitals Group     BP 09/25/16 0746 108/79     Pulse Rate 09/25/16 0746 (!) 58     Resp 09/25/16 0746 16     Temp 09/25/16 0746 98.2 F (36.8 C)     Temp Source 09/25/16 0746 Oral     SpO2 09/25/16 0746 95 %     Weight 09/25/16 0747 200 lb (90.7 kg)     Height 09/25/16 0747  (1.651 m)     Head Circumference --      Peak Flow --      Pain Score 09/25/16 0747 10     Pain Loc --      Pain Edu? --      Excl. in GC? --     Constitutional: Alert and oriented. Well appearing and in no acute distress. Eyes: Conjunctivae are normal. PERRL. EOMI. Head: Atraumatic. Nose: No congestion/rhinnorhea. Mouth/Throat: Mucous membranes are moist.  Oropharynx non-erythematous. Neck: No stridor.  No cervical spine tenderness to palpation. Hematological/Lymphatic/Immunilogical: No cervical lymphadenopathy. Cardiovascular: Normal rate, regular rhythm. Grossly normal heart sounds.  Good peripheral circulation. Respiratory: Normal respiratory effort.  No retractions. Lungs CTAB. Gastrointestinal: Soft and nontender. No distention. No abdominal bruits. No CVA tenderness. Musculoskeletal: No deformities to the  right knee. Patient decreased range of motion with flexion secondary to complain of pain. There is no obvious effusion but no crepitus.  Neurologic:  Normal speech and language. No gross focal neurologic deficits are appreciated. No gait instability. Skin:  Skin is warm, dry and intact. No rash noted. Psychiatric: Mood and affect are normal. Speech and behavior are normal.  ____________________________________________   LABS (all labs ordered are listed, but only abnormal results are displayed)  Labs Reviewed - No data to display ____________________________________________  EKG   ____________________________________________  RADIOLOGY  Reviewed x-ray taken 2 days ago revealing tricompartmental arthritis. ____________________________________________   PROCEDURES  Procedure(s) performed: None  Procedures  Critical Care performed: No  ____________________________________________   INITIAL IMPRESSION / ASSESSMENT AND PLAN / ED COURSE  Pertinent labs & imaging results that were available during my care of the patient were reviewed by me and considered in my medical decision making (see chart for details).  Knee pain  secondary to arthritis. Patient is pending surgery in 5 days.      ____________________________________________   FINAL CLINICAL IMPRESSION(S) / ED DIAGNOSES  Final diagnoses:  Acute pain of right knee  Patient given discharge Instructions. Patient given a 3 days a Percocets. Patient advised to continue to try to contact orthopedic department for pain management pending her surgery.    NEW MEDICATIONS STARTED DURING THIS VISIT:  New Prescriptions   OXYCODONE-ACETAMINOPHEN (ROXICET) 5-325 MG TABLET    Take 1 tablet by mouth every 6 (six) hours as needed for moderate pain.     Note:  This document was prepared using Dragon voice recognition software and may include unintentional dictation errors.    Joni Reining, PA-C 09/25/16 6962      Emily Filbert, MD 09/25/16 [phone]

## 2016-10-24 ENCOUNTER — Encounter
Admission: RE | Admit: 2016-10-24 | Discharge: 2016-10-24 | Disposition: A | Discharge status: Home / Self Care | Payer: Medicaid Other | Source: Ambulatory Visit | Attending: Orthopedic Surgery | Admitting: Orthopedic Surgery

## 2016-10-24 DIAGNOSIS — Z01810 Encounter for preprocedural cardiovascular examination: Secondary | ICD-10-CM | POA: Insufficient documentation

## 2016-10-24 DIAGNOSIS — Z01812 Encounter for preprocedural laboratory examination: Secondary | ICD-10-CM | POA: Insufficient documentation

## 2016-10-24 DIAGNOSIS — I10 Essential (primary) hypertension: Secondary | ICD-10-CM | POA: Diagnosis not present

## 2016-10-24 HISTORY — DX: Acute embolism and thrombosis of unspecified deep veins of unspecified lower extremity: I82.409

## 2016-10-24 HISTORY — DX: Immunodeficiency, unspecified: D84.9

## 2016-10-24 LAB — BASIC METABOLIC PANEL
ANION GAP: 7 (ref 5–15)
BUN: 18 mg/dL (ref 6–20)
CALCIUM: 9.1 mg/dL (ref 8.9–10.3)
CO2: 25 mmol/L (ref 22–32)
Chloride: 106 mmol/L (ref 101–111)
Creatinine, Ser: 0.86 mg/dL (ref 0.44–1.00)
GFR calc Af Amer: >60 mL/min (ref >60)
Glucose, Bld: 92 mg/dL (ref 65–99)
Potassium: 3.7 mmol/L (ref 3.5–5.1)
SODIUM: 138 mmol/L (ref 135–145)

## 2016-10-24 LAB — TYPE AND SCREEN
ABO/RH(D): B POS
ANTIBODY SCREEN: NEGATIVE

## 2016-10-24 LAB — URINALYSIS, COMPLETE (UACMP) WITH MICROSCOPIC
Bacteria, UA: NONE SEEN
Bilirubin Urine: NEGATIVE
GLUCOSE, UA: NEGATIVE mg/dL
HGB URINE DIPSTICK: NEGATIVE
Ketones, ur: NEGATIVE mg/dL
Leukocytes, UA: NEGATIVE
NITRITE: NEGATIVE
PH: 5 (ref 5.0–8.0)
Protein, ur: NEGATIVE mg/dL
Specific Gravity, Urine: 1.025 (ref 1.005–1.030)

## 2016-10-24 LAB — URINE DRUG SCREEN, QUALITATIVE (ARMC ONLY)
AMPHETAMINES, UR SCREEN: NOT DETECTED
BENZODIAZEPINE, UR SCRN: NOT DETECTED
Barbiturates, Ur Screen: NOT DETECTED
COCAINE METABOLITE, UR ~~LOC~~: NOT DETECTED
Cannabinoid 50 Ng, Ur ~~LOC~~: POSITIVE — AB
MDMA (Ecstasy)Ur Screen: NOT DETECTED
Methadone Scn, Ur: NOT DETECTED
OPIATE, UR SCREEN: NOT DETECTED
PHENCYCLIDINE (PCP) UR S: NOT DETECTED
Tricyclic, Ur Screen: NOT DETECTED

## 2016-10-24 LAB — PROTIME-INR
INR: 1.01
Prothrombin Time: 13.3 seconds (ref 11.4–15.2)

## 2016-10-24 LAB — CBC
HEMATOCRIT: 43.1 % (ref 35.0–47.0)
HEMOGLOBIN: 14.2 g/dL (ref 12.0–16.0)
MCH: 31.7 pg (ref 26.0–34.0)
MCHC: 33 g/dL (ref 32.0–36.0)
MCV: 96 fL (ref 80.0–100.0)
PLATELETS: 186 10*3/uL (ref 150–440)
RBC: 4.49 MIL/uL (ref 3.80–5.20)
RDW: 13.2 % (ref 11.5–14.5)
WBC: 3.8 10*3/uL (ref 3.6–11.0)

## 2016-10-24 LAB — SEDIMENTATION RATE: SED RATE: 4 mm/h (ref 0–20)

## 2016-10-24 LAB — APTT: aPTT: 27 seconds (ref 24–36)

## 2016-10-24 LAB — SURGICAL PCR SCREEN
MRSA, PCR: NEGATIVE
Staphylococcus aureus: NEGATIVE

## 2016-10-24 NOTE — OR Nursing (Signed)
Diane Castillo in Dr Rosita KeaMenz office notified of hx of DVT - do not give TXA day of surgery

## 2016-10-24 NOTE — Patient Instructions (Signed)
Your procedure is scheduled on: Oct 30, 2016 Su procedimiento est programado para: Report to SECOND FLOOR OF MEDICAL MALL COME THRU REVOLVING DOOR ENTRANCE Presntese a: To find out your arrival time please call [phone] between 1PM - 3PM on MONDAY 10-29-2016 Para saber su hora de llegada por favor llame al [phone] entre la 1PM - 3PM el da:  Remember: Instructions that are not followed completely may result in serious medical risk, up to and including death, or upon the discretion of your surgeon and anesthesiologist your surgery may need to be rescheduled.  Recuerde: Las instrucciones que no se siguen completamente Armed forces logistics/support/administrative officer en un riesgo de salud grave, incluyendo hasta la [place] o a discrecin de su cirujano y Scientific laboratory technician, su ciruga se puede posponer.   _X___ 1. Do not eat food or drink liquids after midnight. No gum chewing or hard candies.  No coma alimentos ni tome lquidos despus de la medianoche.  No mastique chicle ni caramelos  duros.     __X__ 2. No alcohol for 24 hours before or after surgery.    No tome alcohol durante las 24 horas antes ni despus de la [place].   __X__ 3. Bring all medications with you on the day of surgery if instructed. BRING ANY NEW MEDICATIONS    Lleve todos los medicamentos con usted el da de su ciruga si se le ha indicado as.   __X__ 4. Notify your doctor if there is any change in your medical condition (cold, fever,                             infections).    Informe a su mdico si hay algn cambio en su condicin mdica (resfriado, fiebre, infecciones).   Do not wear jewelry, make-up, hairpins, clips or nail polish.  No use joyas, maquillajes, pinzas/ganchos para el cabello ni esmalte de uas.  Do not wear lotions, powders, or perfumes. You may wear deodorant.  No use lociones, polvos o perfumes.  Puede usar desodorante.    Do not shave 48 hours prior to surgery. Men may shave face and neck.  No se afeite 48 horas antes de  la [place].  Los hombres pueden [employer] cara y el cuello.   Do not bring valuables to the hospital.   No lleve objetos de valor al hospital.  [facility] is not responsible for any belongings or valuables.  [place] no se hace responsable de ningn tipo de pertenencias u objetos de Licensed conveyancer.               Contacts, dentures or bridgework may not be worn into surgery.  Los lentes de [place], las dentaduras postizas o puentes no se pueden usar en la [place].  Leave your suitcase in the car. After surgery it may be brought to your room.  Deje su maleta en el auto.  Despus de la ciruga podr traerla a su habitacin.  For patients admitted to the hospital, discharge time is determined by your treatment team.  Para los pacientes que sean ingresados al hospital, el tiempo en el cual se le dar de alta es determinado por su                equipo de [place].   Patients discharged the day of surgery will not be allowed to drive home. A los pacientes que se les da de alta el mismo da de la ciruga no se les permitir  conducir a casa.   Please read over the following fact sheets that you were given: Por favor lea las siguientes hojas de informacin que le dieron:   MRSA INFO  CHG INFO   __X__ Take these medicines the morning of surgery with A SIP OF WATER:          [employer] estas medicinas la maana de la ciruga con UN SORBO DE AGUA:  1. ENLAPRIL  2. DESCOVY  3. TIVICAY  4.  VALTREX     5.  6.  ____ Fleet Enema (as directed)          Enema de Fleet (segn lo indicado)    __X_ Use CHG Soap as directed          Utilice el jabn de CHG segn lo indicado  __X__ Use inhalers on the day of surgery          Use los inhaladores el da de la ciruga  ____ Stop metformin 2 days prior to surgery          Deje de tomar el metformin 2 das antes de la ciruga    ____ Take 1/2 of usual insulin dose the night before surgery and none on the morning of surgery           Tome la mitad de la  dosis habitual de insulina la noche antes de la [place] y no tome nada en la maana de la             ciruga  __X__ Stop Coumadin/Plavix/aspirin UNTIL AFTER SURGERY         __X__ Stop Anti-inflammatories on  IBUPROFEN, ALEVE, MOTRIN, ADVIL,  BC POWDER   USE TYLENOL      ____ Stop supplements until after surgery            Deje de tomar suplementos hasta despus de la ciruga  ____ Bring C-Pap to the hospital          Lleve el C-Pap al hospital

## 2016-10-25 LAB — URINE CULTURE: Culture: <10000 — AB

## 2016-10-25 NOTE — Pre-Procedure Instructions (Signed)
[facility]

## 2016-10-30 ENCOUNTER — Inpatient Hospital Stay: Payer: Medicaid Other | Admitting: Anesthesiology

## 2016-10-30 ENCOUNTER — Encounter
Admission: RE | Disposition: A | Discharge status: Home / Self Care | Payer: Self-pay | Source: Ambulatory Visit | Attending: Orthopedic Surgery

## 2016-10-30 ENCOUNTER — Inpatient Hospital Stay
Admission: RE | Admit: 2016-10-30 | Discharge: 2016-11-02 | DRG: 470 | Disposition: A | Discharge status: Home / Self Care | Payer: Medicaid Other | Source: Ambulatory Visit | Attending: Orthopedic Surgery | Admitting: Orthopedic Surgery

## 2016-10-30 ENCOUNTER — Encounter: Payer: Self-pay | Admitting: *Deleted

## 2016-10-30 ENCOUNTER — Inpatient Hospital Stay: Payer: Medicaid Other

## 2016-10-30 DIAGNOSIS — Z880 Allergy status to penicillin: Secondary | ICD-10-CM | POA: Diagnosis not present

## 2016-10-30 DIAGNOSIS — J45909 Unspecified asthma, uncomplicated: Secondary | ICD-10-CM | POA: Diagnosis present

## 2016-10-30 DIAGNOSIS — Z888 Allergy status to other drugs, medicaments and biological substances status: Secondary | ICD-10-CM | POA: Diagnosis not present

## 2016-10-30 DIAGNOSIS — Z86718 Personal history of other venous thrombosis and embolism: Secondary | ICD-10-CM

## 2016-10-30 DIAGNOSIS — G8918 Other acute postprocedural pain: Secondary | ICD-10-CM

## 2016-10-30 DIAGNOSIS — I10 Essential (primary) hypertension: Secondary | ICD-10-CM | POA: Diagnosis present

## 2016-10-30 DIAGNOSIS — D62 Acute posthemorrhagic anemia: Secondary | ICD-10-CM | POA: Diagnosis not present

## 2016-10-30 DIAGNOSIS — Z7982 Long term (current) use of aspirin: Secondary | ICD-10-CM | POA: Diagnosis not present

## 2016-10-30 DIAGNOSIS — Z79899 Other long term (current) drug therapy: Secondary | ICD-10-CM

## 2016-10-30 DIAGNOSIS — D849 Immunodeficiency, unspecified: Secondary | ICD-10-CM | POA: Diagnosis present

## 2016-10-30 DIAGNOSIS — M1711 Unilateral primary osteoarthritis, right knee: Secondary | ICD-10-CM | POA: Diagnosis present

## 2016-10-30 LAB — URINE DRUG SCREEN, QUALITATIVE (ARMC ONLY)
Amphetamines, Ur Screen: NOT DETECTED
BARBITURATES, UR SCREEN: NOT DETECTED
BENZODIAZEPINE, UR SCRN: NOT DETECTED
CANNABINOID 50 NG, UR ~~LOC~~: POSITIVE — AB
COCAINE METABOLITE, UR ~~LOC~~: NOT DETECTED
MDMA (Ecstasy)Ur Screen: NOT DETECTED
METHADONE SCREEN, URINE: NOT DETECTED
OPIATE, UR SCREEN: NOT DETECTED
Phencyclidine (PCP) Ur S: NOT DETECTED
Tricyclic, Ur Screen: NOT DETECTED

## 2016-10-30 LAB — CBC
HCT: 40.8 % (ref 35.0–47.0)
HEMOGLOBIN: 13.4 g/dL (ref 12.0–16.0)
MCH: 31.6 pg (ref 26.0–34.0)
MCHC: 32.8 g/dL (ref 32.0–36.0)
MCV: 96.1 fL (ref 80.0–100.0)
PLATELETS: 178 10*3/uL (ref 150–440)
RBC: 4.24 MIL/uL (ref 3.80–5.20)
RDW: 13.5 % (ref 11.5–14.5)
WBC: 5.9 10*3/uL (ref 3.6–11.0)

## 2016-10-30 LAB — ABO/RH: ABO/RH(D): B POS

## 2016-10-30 LAB — CREATININE, SERUM
CREATININE: 0.68 mg/dL (ref 0.44–1.00)
GFR calc non Af Amer: >60 mL/min (ref >60)

## 2016-10-30 SURGERY — ARTHROPLASTY, KNEE, TOTAL
Anesthesia: Spinal | Laterality: Right | Wound class: Clean

## 2016-10-30 MED ORDER — KETOROLAC TROMETHAMINE 30 MG/ML IJ SOLN
INTRAMUSCULAR | Status: AC
  Filled 2016-10-30: qty 1

## 2016-10-30 MED ORDER — CLINDAMYCIN PHOSPHATE 600 MG/50ML IV SOLN
INTRAVENOUS | Status: AC
  Filled 2016-10-30: qty 50

## 2016-10-30 MED ORDER — NEOMYCIN-POLYMYXIN B GU 40-200000 IR SOLN
Status: AC
  Filled 2016-10-30: qty 20

## 2016-10-30 MED ORDER — MENTHOL 3 MG MT LOZG
1.0000 | LOZENGE | OROMUCOSAL | Status: DC | PRN
  Filled 2016-10-30: qty 9

## 2016-10-30 MED ORDER — PROMETHAZINE HCL 25 MG/ML IJ SOLN: 6.2500 mg | INTRAMUSCULAR | Status: DC | PRN

## 2016-10-30 MED ORDER — MORPHINE SULFATE (PF) 2 MG/ML IV SOLN
2.0000 mg | INTRAVENOUS | Status: DC | PRN
  Administered 2016-10-30 – 2016-10-31 (×4): 2 mg via INTRAVENOUS
  Filled 2016-10-30 (×4): qty 1

## 2016-10-30 MED ORDER — PROPOFOL 500 MG/50ML IV EMUL
INTRAVENOUS | Status: DC | PRN
  Administered 2016-10-30: 100 ug/kg/min via INTRAVENOUS

## 2016-10-30 MED ORDER — OXYCODONE HCL 5 MG PO TABS
5.0000 mg | ORAL_TABLET | ORAL | Status: DC | PRN
  Administered 2016-10-30: 10 mg via ORAL
  Filled 2016-10-30: qty 2

## 2016-10-30 MED ORDER — ACETAMINOPHEN 650 MG RE SUPP
650.0000 mg | Freq: Four times a day (QID) | RECTAL | Status: DC | PRN
Start: 2016-10-30 — End: 2016-11-02

## 2016-10-30 MED ORDER — ALBUTEROL SULFATE (2.5 MG/3ML) 0.083% IN NEBU: 2.5000 mg | INHALATION_SOLUTION | Freq: Four times a day (QID) | RESPIRATORY_TRACT | Status: DC | PRN

## 2016-10-30 MED ORDER — FENTANYL CITRATE (PF) 100 MCG/2ML IJ SOLN
INTRAMUSCULAR | Status: AC
  Filled 2016-10-30: qty 2

## 2016-10-30 MED ORDER — METHOCARBAMOL 1000 MG/10ML IJ SOLN
500.0000 mg | Freq: Four times a day (QID) | INTRAVENOUS | Status: DC | PRN
  Filled 2016-10-30: qty 5

## 2016-10-30 MED ORDER — CLINDAMYCIN PHOSPHATE 900 MG/50ML IV SOLN
900.0000 mg | Freq: Once | INTRAVENOUS | Status: AC
  Administered 2016-10-30: 900 mg via INTRAVENOUS

## 2016-10-30 MED ORDER — MAGNESIUM HYDROXIDE 400 MG/5ML PO SUSP
30.0000 mL | Freq: Every day | ORAL | Status: DC | PRN
  Administered 2016-10-30 – 2016-11-02 (×3): 30 mL via ORAL
  Filled 2016-10-30 (×3): qty 30

## 2016-10-30 MED ORDER — MIDAZOLAM HCL 2 MG/2ML IJ SOLN
INTRAMUSCULAR | Status: AC
  Filled 2016-10-30: qty 2

## 2016-10-30 MED ORDER — DIPHENHYDRAMINE HCL 12.5 MG/5ML PO ELIX
12.5000 mg | ORAL_SOLUTION | ORAL | Status: DC | PRN
  Administered 2016-11-01: 25 mg via ORAL
  Filled 2016-10-30: qty 10

## 2016-10-30 MED ORDER — ACETAMINOPHEN 10 MG/ML IV SOLN
INTRAVENOUS | Status: DC | PRN
  Administered 2016-10-30: 1000 mg via INTRAVENOUS

## 2016-10-30 MED ORDER — METOCLOPRAMIDE HCL 10 MG PO TABS
5.0000 mg | ORAL_TABLET | Freq: Three times a day (TID) | ORAL | Status: DC | PRN
  Administered 2016-10-31: 10 mg via ORAL

## 2016-10-30 MED ORDER — EMTRICITABINE-TENOFOVIR AF 200-25 MG PO TABS
1.0000 | ORAL_TABLET | Freq: Every day | ORAL | Status: DC
  Administered 2016-10-30 – 2016-11-02 (×4): 1 via ORAL
  Filled 2016-10-30 (×4): qty 1

## 2016-10-30 MED ORDER — BUPIVACAINE HCL (PF) 0.5 % IJ SOLN
INTRAMUSCULAR | Status: DC | PRN
  Administered 2016-10-30: 2.5 mL

## 2016-10-30 MED ORDER — FAMOTIDINE 20 MG PO TABS
ORAL_TABLET | ORAL | Status: AC
  Filled 2016-10-30: qty 1

## 2016-10-30 MED ORDER — ONDANSETRON HCL 4 MG/2ML IJ SOLN
4.0000 mg | Freq: Four times a day (QID) | INTRAMUSCULAR | Status: DC | PRN
Start: 2016-10-30 — End: 2016-11-02
  Administered 2016-10-30: 4 mg via INTRAVENOUS
  Filled 2016-10-30: qty 2

## 2016-10-30 MED ORDER — ENOXAPARIN SODIUM 30 MG/0.3ML ~~LOC~~ SOLN
30.0000 mg | Freq: Two times a day (BID) | SUBCUTANEOUS | Status: DC
  Administered 2016-10-31 – 2016-11-02 (×5): 30 mg via SUBCUTANEOUS
  Filled 2016-10-30 (×5): qty 0.3

## 2016-10-30 MED ORDER — DOCUSATE SODIUM 100 MG PO CAPS
100.0000 mg | ORAL_CAPSULE | Freq: Two times a day (BID) | ORAL | Status: DC
  Administered 2016-10-30 – 2016-11-02 (×6): 100 mg via ORAL
  Filled 2016-10-30 (×6): qty 1

## 2016-10-30 MED ORDER — PHENYLEPHRINE HCL 10 MG/ML IJ SOLN
INTRAMUSCULAR | Status: AC
  Filled 2016-10-30: qty 1

## 2016-10-30 MED ORDER — BUPIVACAINE LIPOSOME 1.3 % IJ SUSP
INTRAMUSCULAR | Status: AC
  Filled 2016-10-30: qty 20

## 2016-10-30 MED ORDER — METOCLOPRAMIDE HCL 5 MG/ML IJ SOLN
5.0000 mg | Freq: Three times a day (TID) | INTRAMUSCULAR | Status: DC | PRN
Start: 2016-10-30 — End: 2016-11-02
  Administered 2016-10-30: 10 mg via INTRAVENOUS
  Filled 2016-10-30: qty 2

## 2016-10-30 MED ORDER — ASPIRIN EC 81 MG PO TBEC
81.0000 mg | DELAYED_RELEASE_TABLET | Freq: Every day | ORAL | Status: DC
  Administered 2016-10-30 – 2016-11-02 (×4): 81 mg via ORAL
  Filled 2016-10-30 (×4): qty 1

## 2016-10-30 MED ORDER — PROPOFOL 500 MG/50ML IV EMUL
INTRAVENOUS | Status: AC
  Filled 2016-10-30: qty 50

## 2016-10-30 MED ORDER — OXYCODONE HCL 5 MG PO TABS
15.0000 mg | ORAL_TABLET | ORAL | Status: AC
  Administered 2016-10-30: 15 mg via ORAL
  Filled 2016-10-30: qty 3

## 2016-10-30 MED ORDER — SODIUM CHLORIDE 0.9 % IV SOLN
INTRAVENOUS | Status: DC
  Administered 2016-10-30 – 2016-10-31 (×2): via INTRAVENOUS

## 2016-10-30 MED ORDER — PHENOL 1.4 % MT LIQD
1.0000 | OROMUCOSAL | Status: DC | PRN
  Filled 2016-10-30: qty 177

## 2016-10-30 MED ORDER — VALACYCLOVIR HCL 500 MG PO TABS
500.0000 mg | ORAL_TABLET | Freq: Two times a day (BID) | ORAL | Status: DC
  Administered 2016-10-30 – 2016-11-02 (×7): 500 mg via ORAL
  Filled 2016-10-30 (×8): qty 1

## 2016-10-30 MED ORDER — MORPHINE SULFATE (PF) 10 MG/ML IV SOLN
INTRAVENOUS | Status: AC
  Filled 2016-10-30: qty 1

## 2016-10-30 MED ORDER — ALUM & MAG HYDROXIDE-SIMETH 200-200-20 MG/5ML PO SUSP
30.0000 mL | ORAL | Status: DC | PRN
  Filled 2016-10-30: qty 30

## 2016-10-30 MED ORDER — MORPHINE SULFATE 10 MG/ML IJ SOLN
INTRAMUSCULAR | Status: DC | PRN
  Administered 2016-10-30: 10 mg

## 2016-10-30 MED ORDER — CLINDAMYCIN PHOSPHATE 900 MG/50ML IV SOLN
INTRAVENOUS | Status: AC
  Filled 2016-10-30: qty 50

## 2016-10-30 MED ORDER — NEOMYCIN-POLYMYXIN B GU 40-200000 IR SOLN
Status: DC | PRN
  Administered 2016-10-30: 16 mL

## 2016-10-30 MED ORDER — FENTANYL CITRATE (PF) 100 MCG/2ML IJ SOLN
25.0000 ug | INTRAMUSCULAR | Status: DC | PRN
  Administered 2016-10-30: 50 ug via INTRAVENOUS

## 2016-10-30 MED ORDER — METHOCARBAMOL 500 MG PO TABS
500.0000 mg | ORAL_TABLET | Freq: Four times a day (QID) | ORAL | Status: DC | PRN
  Administered 2016-10-30 – 2016-11-01 (×6): 500 mg via ORAL
  Filled 2016-10-30 (×6): qty 1

## 2016-10-30 MED ORDER — CLINDAMYCIN PHOSPHATE 900 MG/50ML IV SOLN
900.0000 mg | Freq: Four times a day (QID) | INTRAVENOUS | Status: AC
  Administered 2016-10-30 – 2016-10-31 (×4): 900 mg via INTRAVENOUS
  Filled 2016-10-30 (×5): qty 50

## 2016-10-30 MED ORDER — SODIUM CHLORIDE 0.9 % IJ SOLN
INTRAMUSCULAR | Status: AC
  Filled 2016-10-30: qty 50

## 2016-10-30 MED ORDER — PROPOFOL 10 MG/ML IV BOLUS
INTRAVENOUS | Status: DC | PRN
  Administered 2016-10-30: 70 mg via INTRAVENOUS

## 2016-10-30 MED ORDER — LACTATED RINGERS IV SOLN
INTRAVENOUS | Status: DC
  Administered 2016-10-30 (×2): via INTRAVENOUS

## 2016-10-30 MED ORDER — FAMOTIDINE 20 MG PO TABS
20.0000 mg | ORAL_TABLET | Freq: Once | ORAL | Status: AC
  Administered 2016-10-30: 20 mg via ORAL

## 2016-10-30 MED ORDER — ENALAPRIL MALEATE 10 MG PO TABS
10.0000 mg | ORAL_TABLET | Freq: Every day | ORAL | Status: DC
Start: 2016-10-30 — End: 2016-11-02
  Administered 2016-10-30 – 2016-11-02 (×4): 10 mg via ORAL
  Filled 2016-10-30 (×5): qty 1

## 2016-10-30 MED ORDER — BUPIVACAINE-EPINEPHRINE (PF) 0.25% -1:200000 IJ SOLN
INTRAMUSCULAR | Status: AC
  Filled 2016-10-30: qty 30

## 2016-10-30 MED ORDER — FENTANYL CITRATE (PF) 100 MCG/2ML IJ SOLN
25.0000 ug | INTRAMUSCULAR | Status: DC | PRN
  Administered 2016-10-30 (×3): 50 ug via INTRAVENOUS

## 2016-10-30 MED ORDER — ACETAMINOPHEN 10 MG/ML IV SOLN
INTRAVENOUS | Status: AC
  Filled 2016-10-30: qty 100

## 2016-10-30 MED ORDER — ONDANSETRON HCL 4 MG PO TABS: 4.0000 mg | ORAL_TABLET | Freq: Four times a day (QID) | ORAL | Status: DC | PRN

## 2016-10-30 MED ORDER — BISACODYL 10 MG RE SUPP
10.0000 mg | Freq: Every day | RECTAL | Status: DC | PRN
  Administered 2016-11-02: 10 mg via RECTAL
  Filled 2016-10-30: qty 1

## 2016-10-30 MED ORDER — FENTANYL CITRATE (PF) 100 MCG/2ML IJ SOLN
INTRAMUSCULAR | Status: AC
  Administered 2016-10-30: 50 ug via INTRAVENOUS
  Filled 2016-10-30: qty 2

## 2016-10-30 MED ORDER — KETOROLAC TROMETHAMINE 30 MG/ML IJ SOLN
INTRAMUSCULAR | Status: DC | PRN
  Administered 2016-10-30: 30 mg via INTRA_ARTICULAR

## 2016-10-30 MED ORDER — EPHEDRINE SULFATE 50 MG/ML IJ SOLN
INTRAMUSCULAR | Status: DC | PRN
  Administered 2016-10-30: 10 mg via INTRAVENOUS

## 2016-10-30 MED ORDER — ALBUTEROL SULFATE HFA 108 (90 BASE) MCG/ACT IN AERS: 2.0000 | INHALATION_SPRAY | Freq: Four times a day (QID) | RESPIRATORY_TRACT | Status: DC | PRN

## 2016-10-30 MED ORDER — ACETAMINOPHEN 325 MG PO TABS
650.0000 mg | ORAL_TABLET | Freq: Four times a day (QID) | ORAL | Status: DC | PRN
  Administered 2016-10-31 – 2016-11-02 (×3): 650 mg via ORAL
  Filled 2016-10-30 (×3): qty 2

## 2016-10-30 MED ORDER — DOLUTEGRAVIR SODIUM 50 MG PO TABS
50.0000 mg | ORAL_TABLET | Freq: Every day | ORAL | Status: DC
  Administered 2016-10-30 – 2016-11-02 (×4): 50 mg via ORAL
  Filled 2016-10-30 (×4): qty 1

## 2016-10-30 MED ORDER — MIDAZOLAM HCL 5 MG/5ML IJ SOLN
INTRAMUSCULAR | Status: DC | PRN
  Administered 2016-10-30 (×2): 2 mg via INTRAVENOUS

## 2016-10-30 MED ORDER — HYDROMORPHONE HCL 1 MG/ML IJ SOLN
0.5000 mg | Freq: Once | INTRAMUSCULAR | Status: AC
  Administered 2016-10-30: 0.5 mg via INTRAVENOUS
  Filled 2016-10-30: qty 1

## 2016-10-30 MED ORDER — BUPIVACAINE HCL (PF) 0.5 % IJ SOLN
INTRAMUSCULAR | Status: AC
  Filled 2016-10-30: qty 10

## 2016-10-30 MED ORDER — ZOLPIDEM TARTRATE 5 MG PO TABS
5.0000 mg | ORAL_TABLET | Freq: Every evening | ORAL | Status: DC | PRN
Start: 2016-10-30 — End: 2016-11-02
  Administered 2016-10-31 – 2016-11-01 (×2): 5 mg via ORAL
  Filled 2016-10-30 (×2): qty 1

## 2016-10-30 MED ORDER — EPHEDRINE SULFATE 50 MG/ML IJ SOLN
INTRAMUSCULAR | Status: AC
  Filled 2016-10-30: qty 1

## 2016-10-30 MED ORDER — OXYCODONE HCL 5 MG PO TABS
15.0000 mg | ORAL_TABLET | Freq: Two times a day (BID) | ORAL | Status: DC
  Administered 2016-10-30 – 2016-10-31 (×2): 15 mg via ORAL
  Filled 2016-10-30 (×2): qty 3

## 2016-10-30 MED ORDER — SODIUM CHLORIDE 0.9 % IV SOLN
INTRAVENOUS | Status: DC | PRN
  Administered 2016-10-30: 60 mL

## 2016-10-30 MED ORDER — MAGNESIUM CITRATE PO SOLN
1.0000 | Freq: Once | ORAL | Status: DC | PRN
  Filled 2016-10-30 (×2): qty 296

## 2016-10-30 MED ORDER — BUPIVACAINE-EPINEPHRINE (PF) 0.25% -1:200000 IJ SOLN
INTRAMUSCULAR | Status: DC | PRN
  Administered 2016-10-30: 30 mL via PERINEURAL

## 2016-10-30 MED ORDER — SODIUM CHLORIDE 0.9 % IJ SOLN
INTRAMUSCULAR | Status: DC | PRN
  Administered 2016-10-30: 28 mL

## 2016-10-30 SURGICAL SUPPLY — 57 items
BANDAGE ACE 6X5 VEL STRL LF (GAUZE/BANDAGES/DRESSINGS) ×3 IMPLANT
BLADE SAW 1 (BLADE) ×3 IMPLANT
BONE CEMENT GENTAMICIN (Cement) ×6 IMPLANT
CANISTER SUCT 1200ML W/VALVE (MISCELLANEOUS) ×3 IMPLANT
CANISTER SUCT 3000ML PPV (MISCELLANEOUS) ×6 IMPLANT
CAPT KNEE TOTAL 3 ×3 IMPLANT
CATH FOL LEG HOLDER (MISCELLANEOUS) ×3 IMPLANT
CATH TRAY METER 16FR LF (MISCELLANEOUS) ×3 IMPLANT
CEMENT BONE GENTAMICIN 40 (Cement) ×2 IMPLANT
CHLORAPREP W/TINT 26ML (MISCELLANEOUS) ×6 IMPLANT
COOLER POLAR GLACIER W/PUMP (MISCELLANEOUS) ×3 IMPLANT
CUFF TOURN 24 STER (MISCELLANEOUS) IMPLANT
CUFF TOURN 30 STER DUAL PORT (MISCELLANEOUS) IMPLANT
DRAPE SHEET LG 3/4 BI-LAMINATE (DRAPES) ×6 IMPLANT
ELECT CAUTERY BLADE 6.4 (BLADE) ×3 IMPLANT
ELECT REM PT RETURN 9FT ADLT (ELECTROSURGICAL) ×3
ELECTRODE REM PT RTRN 9FT ADLT (ELECTROSURGICAL) ×1 IMPLANT
GAUZE PETRO XEROFOAM 1X8 (MISCELLANEOUS) ×3 IMPLANT
GAUZE SPONGE 4X4 12PLY STRL (GAUZE/BANDAGES/DRESSINGS) ×3 IMPLANT
GLOVE BIOGEL PI IND STRL 9 (GLOVE) ×1 IMPLANT
GLOVE BIOGEL PI INDICATOR 9 (GLOVE) ×2
GLOVE INDICATOR 8.0 STRL GRN (GLOVE) ×3 IMPLANT
GLOVE SURG ORTHO 8.0 STRL STRW (GLOVE) ×3 IMPLANT
GLOVE SURG SYN 9.0  PF PI (GLOVE) ×4
GLOVE SURG SYN 9.0 PF PI (GLOVE) ×2 IMPLANT
GOWN SRG 2XL LVL 4 RGLN SLV (GOWNS) ×1 IMPLANT
GOWN STRL NON-REIN 2XL LVL4 (GOWNS) ×2
GOWN STRL REUS W/ TWL LRG LVL3 (GOWN DISPOSABLE) ×1 IMPLANT
GOWN STRL REUS W/ TWL XL LVL3 (GOWN DISPOSABLE) ×1 IMPLANT
GOWN STRL REUS W/TWL LRG LVL3 (GOWN DISPOSABLE) ×2
GOWN STRL REUS W/TWL XL LVL3 (GOWN DISPOSABLE) ×2
HANDLE YANKAUER SUCT BULB TIP (MISCELLANEOUS) ×3 IMPLANT
HOOD PEEL AWAY FLYTE STAYCOOL (MISCELLANEOUS) ×6 IMPLANT
IMMBOLIZER KNEE 19 BLUE UNIV (SOFTGOODS) ×3 IMPLANT
KIT RM TURNOVER STRD PROC AR (KITS) ×3 IMPLANT
KNIFE SCULPS 14X20 (INSTRUMENTS) ×3 IMPLANT
NDL SAFETY 18GX1.5 (NEEDLE) ×3 IMPLANT
NEEDLE SPNL 18GX3.5 QUINCKE PK (NEEDLE) ×3 IMPLANT
NEEDLE SPNL 20GX3.5 QUINCKE YW (NEEDLE) ×3 IMPLANT
NS IRRIG 1000ML POUR BTL (IV SOLUTION) ×3 IMPLANT
PACK TOTAL KNEE (MISCELLANEOUS) ×3 IMPLANT
PAD WRAPON POLAR KNEE (MISCELLANEOUS) ×1 IMPLANT
PULSAVAC PLUS IRRIG FAN TIP (DISPOSABLE) ×2
SOL .9 NS 3000ML IRR  AL (IV SOLUTION) ×2
SOL .9 NS 3000ML IRR UROMATIC (IV SOLUTION) ×1 IMPLANT
STAPLER SKIN PROX 35W (STAPLE) ×3 IMPLANT
SUCTION FRAZIER HANDLE 10FR (MISCELLANEOUS) ×2
SUCTION TUBE FRAZIER 10FR DISP (MISCELLANEOUS) ×1 IMPLANT
SUT DVC 2 QUILL PDO  T11 36X36 (SUTURE) ×2
SUT DVC 2 QUILL PDO T11 36X36 (SUTURE) ×1 IMPLANT
SUT V-LOC 90 ABS DVC 3-0 CL (SUTURE) ×3 IMPLANT
SYR 20CC LL (SYRINGE) ×3 IMPLANT
SYR 50ML LL SCALE MARK (SYRINGE) ×6 IMPLANT
TIP FAN IRRIG PULSAVAC PLUS (DISPOSABLE) ×1 IMPLANT
TOWEL OR 17X26 4PK STRL BLUE (TOWEL DISPOSABLE) ×3 IMPLANT
TOWER CARTRIDGE SMART MIX (DISPOSABLE) ×3 IMPLANT
WRAPON POLAR PAD KNEE (MISCELLANEOUS) ×3

## 2016-10-30 NOTE — Plan of Care (Signed)
Problem: Pain Managment: Goal: General experience of comfort will improve Outcome: Not Progressing Patient experiencing surgical and chronic and chronic pain of right knee.

## 2016-10-30 NOTE — Anesthesia Preprocedure Evaluation (Signed)
Anesthesia Evaluation  Patient identified by MRN, date of birth, ID band Patient awake    Reviewed: Allergy & Precautions, H&P , NPO status , Patient's Chart, lab work & pertinent test results, reviewed documented beta blocker date and time   History of Anesthesia Complications Negative for: history of anesthetic complications  Airway Mallampati: III  TM Distance: >3 FB Neck ROM: full    Dental  (+) Poor Dentition, Missing, Dental Advidsory Given   Pulmonary neg shortness of breath, asthma , neg sleep apnea, neg COPD, neg recent URI, Current Smoker, former smoker,           Cardiovascular Exercise Tolerance: Good hypertension, (-) angina(-) CAD, (-) Past MI, (-) Cardiac Stents and (-) CABG (-) dysrhythmias (-) Valvular Problems/Murmurs     Neuro/Psych negative neurological ROS  negative psych ROS   GI/Hepatic negative GI ROS, Neg liver ROS,   Endo/Other  negative endocrine ROS  Renal/GU negative Renal ROS  negative genitourinary   Musculoskeletal   Abdominal   Peds  Hematology negative hematology ROS (+)   Anesthesia Other Findings Past Medical History: No date: Asthma No date: DVT (deep venous thrombosis) (HCC)     Comment: right leg No date: Hypertension No date: Immune deficiency disorder (HCC)   Reproductive/Obstetrics negative OB ROS                             Anesthesia Physical Anesthesia Plan  ASA: III  Anesthesia Plan: Spinal   Post-op Pain Management:    Induction:   Airway Management Planned:   Additional Equipment:   Intra-op Plan:   Post-operative Plan:   Informed Consent: I have reviewed the patients History and Physical, chart, labs and discussed the procedure including the risks, benefits and alternatives for the proposed anesthesia with the patient or authorized representative who has indicated his/her understanding and acceptance.   Dental Advisory  Given  Plan Discussed with: Anesthesiologist, CRNA and Surgeon  Anesthesia Plan Comments:         Anesthesia Quick Evaluation

## 2016-10-30 NOTE — H&P (Signed)
Reviewed paper H+P, will be scanned into chart. No changes noted. Patient examined  

## 2016-10-30 NOTE — Op Note (Signed)
10/30/2016  10:11 AM  PATIENT:  Staci AcostaSarah A Stalder  46 y.o. female  PRE-OPERATIVE DIAGNOSIS:  PRIMARY OSTEOARTHRITIS OF RIGHT KNEE  POST-OPERATIVE DIAGNOSIS:  PRIMARY OSTEOARTHRITIS OF RIGHT KNEE  PROCEDURE:  Procedure(s): TOTAL KNEE ARTHROPLASTY (Right)  SURGEON: Leitha SchullerMichael J Nery Frappier, MD  ASSISTANTS: [place] Neighborhris [facility]  ANESTHESIA:   spinal  EBL:  Total I/O In: 1150 [I.V.:1150] Out: 350 [Urine:275; Blood:75]  BLOOD ADMINISTERED:none  DRAINS: none   LOCAL MEDICATIONS USED:  MARCAINE    and OTHER Exparel, Toradol and morphine  SPECIMEN:  No Specimen  DISPOSITION OF SPECIMEN:  N/A  COUNTS:  YES  TOURNIQUET:   90 minutes at 300 mmHg  IMPLANTS: Medacta GMK sphere 5 right femur 4 tibia with 65 mm extension 10 mm insert and 3 patella all components cemented  DICTATION: .Dragon Dictation patient brought the operating room and after adequate anesthesia was obtained the right leg was prepped and draped in sterile fashion. After patient identification and timeout procedures were completed medial parapatellar arthrotomy skin incision was made after raising the tourniquet this was done because of an old anterior cruciate ligament scar that was too close to the midline to be avoided and so was incorporated into this medial arthrotomy following the skin incision flaps were developed with thick skin flaps and a medial parapatellar arthrotomy the joint was performed with exposure of the joint there is extensive degenerative change in the trochlea especially the patella and then medial lateral femoral condyles there were areas of exposed bone proximally 2 cm in diameter and likewise on the tibial side. The anterior cruciate ligament was deficient and lateral meniscus was also deficient. Fat pad and anterior horns of the menisci excised along the ligaments proximal tibia cut was carried out using a extra medullary tibial alignment guide followed by distal femoral resection and a gap gauge placed that gave  appropriate position of the bone cuts. The femur sized to size 5 with an intramedullary rod technique and anterior posterior chamfer cuts made with no notching. The distal femoral drill holes were made along with the trochlear groove cut. The tibia approximate tibia was prepared with proximal drilling and keel punch there was a staple medial but this did not come into contact with the stem trial. Next the patella was cut using the patellar cutting guide and measured a size 3 after 3 drill holes were made. At this point the tourniquet was let down and injection of the above was given after adequate HEMOSTASIS. The tourniquet was then raised the bony surfaces thoroughly irrigated and dried all components were cemented into place with a 10 mm insert tightened with a torque wrench and after the cemented set, excess cement was removed. The patella tracked well with no touch technique. The arthrotomy was then repaired using a heavy Quill after Betadine dilute Betadine irrigation of the joint followed by pulsatile lavage after the capsule was closed the subcutaneous was closed with 3-0 V-LOC. Skin was closed with staples and then a Provena plus incisional wound VAC was applied followed by an Ace wrap and Polar Care.  PLAN OF CARE: Admit to inpatient   PATIENT DISPOSITION:  PACU - hemodynamically stable.

## 2016-10-30 NOTE — Transfer of Care (Signed)
Immediate Anesthesia Transfer of Care Note  Patient: Diane Castillo  Procedure(s) Performed: Procedure(s): TOTAL KNEE ARTHROPLASTY (Right)  Patient Location: PACU  Anesthesia Type:Spinal  Level of Consciousness: oriented and drowsy  Airway & Oxygen Therapy: Patient Spontanous Breathing and Patient connected to nasal cannula oxygen  Post-op Assessment: Report given to RN and Post -op Vital signs reviewed and stable  Post vital signs: Reviewed and stable  Last Vitals:  Vitals:   10/30/16 0627 10/30/16 1005  BP: (!) 147/93 130/84  Pulse: 66 63  Resp: 12 14  Temp: 37.1 C     Last Pain:  Vitals:   10/30/16 0627  TempSrc: Oral  PainSc: 10-Worst pain ever         Complications: No apparent anesthesia complications

## 2016-10-30 NOTE — Anesthesia Procedure Notes (Signed)
Spinal  Patient location during procedure: OR End time: 10/30/2016 7:31 AM Staffing Anesthesiologist: Martha Clan Resident/CRNA: Jonna Clark Performed: resident/CRNA  Preanesthetic Checklist Completed: patient identified, site marked, surgical consent, pre-op evaluation, timeout performed, IV checked, risks and benefits discussed and monitors and equipment checked Spinal Block Patient position: sitting Prep: Betadine Patient monitoring: heart rate, continuous pulse ox, blood pressure and cardiac monitor Approach: midline Location: L4-5 Injection technique: single-shot Needle Needle type: Whitacre and Introducer  Needle gauge: 24 G Needle length: 10 cm Assessment Sensory level: T6 Additional Notes Negative paresthesia. Negative blood return. Positive free-flowing CSF. Expiration date of kit checked and confirmed. Patient tolerated procedure well, without complications.

## 2016-10-30 NOTE — Progress Notes (Signed)
Pt. Has oxycodone 15mg  ordered. Dr. Joice LoftsPoggi notified and wants med given just as ordered.

## 2016-10-30 NOTE — Anesthesia Post-op Follow-up Note (Cosign Needed)
Anesthesia QCDR form completed.        

## 2016-10-30 NOTE — NC FL2 (Signed)
[place] MEDICAID FL2 LEVEL OF CARE SCREENING TOOL     IDENTIFICATION  Patient Name: Diane AcostaSarah A Castillo Birthdate: 05/23/1971 Sex: female Admission Date (Current Location): 10/30/2016  [facility] [place]  [place]  ([id number]) Facility and Address:  [facility], [address], [place], [place] [address]      Provider Number: [id number]  Attending Physician Name and Address:  Kennedy BuckerMenz, Michael, MD  Relative Name and Phone Number:       Current Level of Care: Hospital Recommended Level of Care: Skilled Nursing Facility Prior Approval Number:    Date Approved/Denied:   PASRR Number:  [id number])  Discharge Plan: SNF    Current Diagnoses: Patient Active Problem List   Diagnosis Date Noted  . Primary localized osteoarthritis of right knee 10/30/2016  . Right leg pain 11/14/2015  . Gastrocnemius muscle strain 11/14/2015  . Hypokalemia 11/14/2015  . Metabolic acidosis 11/14/2015  . Hyperglycemia 11/14/2015  . Drug abuse 11/14/2015  . Rhabdomyolysis 11/13/2015    Orientation RESPIRATION BLADDER Height & Weight     Self, Time, Situation, Place  Normal Continent Weight: 228 lb (103.4 kg) Height:  5\' 5"  (165.1 cm)  BEHAVIORAL SYMPTOMS/MOOD NEUROLOGICAL BOWEL NUTRITION STATUS   (none)  (none) Continent Diet (Regular Diet )  AMBULATORY STATUS COMMUNICATION OF NEEDS Skin   Extensive Assist Verbally Surgical wounds, Wound Vac (Incision: Right Knee. Provena wound vac. )                       Personal Care Assistance Level of Assistance  Bathing, Feeding, Dressing Bathing Assistance: Limited assistance Feeding assistance: Independent Dressing Assistance: Limited assistance     Functional Limitations Info  Sight, Hearing, Speech Sight Info: Adequate Hearing Info: Adequate Speech Info: Adequate    SPECIAL CARE FACTORS FREQUENCY  PT (By licensed PT), OT (By licensed OT)     PT Frequency:  (5) OT Frequency:  (5)             Contractures      Additional Factors Info  Code Status, Allergies Code Status Info:  (Full Code. ) Allergies Info:  (Penicillins, Tramadol)           Current Medications (10/30/2016):  This is the current hospital active medication list Current Facility-Administered Medications  Medication Dose Route Frequency Provider Last Rate Last Dose  . 0.9 %  sodium chloride infusion   Intravenous Continuous Kennedy BuckerMenz, Michael, MD      . acetaminophen (TYLENOL) tablet 650 mg  650 mg Oral Q6H PRN Kennedy BuckerMenz, Michael, MD       Or  . acetaminophen (TYLENOL) suppository 650 mg  650 mg Rectal Q6H PRN Kennedy BuckerMenz, Michael, MD      . albuterol (PROVENTIL) (2.5 MG/3ML) 0.083% nebulizer solution 2.5 mg  2.5 mg Nebulization Q6H PRN Kennedy BuckerMenz, Michael, MD      . alum & mag hydroxide-simeth (MAALOX/MYLANTA) 200-200-20 MG/5ML suspension 30 mL  30 mL Oral Q4H PRN Kennedy BuckerMenz, Michael, MD      . aspirin EC tablet 81 mg  81 mg Oral Daily Kennedy BuckerMenz, Michael, MD   81 mg at 10/30/16 1250  . bisacodyl (DULCOLAX) suppository 10 mg  10 mg Rectal Daily PRN Kennedy BuckerMenz, Michael, MD      . clindamycin (CLEOCIN) IVPB 900 mg  900 mg Intravenous Q6H Kennedy BuckerMenz, Michael, MD 100 mL/hr at 10/30/16 1251 900 mg at 10/30/16 1251  . diphenhydrAMINE (BENADRYL) 12.5 MG/5ML elixir 12.5-25 mg  12.5-25 mg Oral Q4H PRN Kennedy BuckerMenz, Michael, MD      .  docusate sodium (COLACE) capsule 100 mg  100 mg Oral BID Kennedy Bucker, MD      . dolutegravir (TIVICAY) tablet 50 mg  50 mg Oral Daily Kennedy Bucker, MD   50 mg at 10/30/16 1250  . emtricitabine-tenofovir AF (DESCOVY) 200-25 MG per tablet 1 tablet  1 tablet Oral Daily Kennedy Bucker, MD   1 tablet at 10/30/16 1250  . enalapril (VASOTEC) tablet 10 mg  10 mg Oral Daily Kennedy Bucker, MD      . Melene Muller ON 10/31/2016] enoxaparin (LOVENOX) injection 30 mg  30 mg Subcutaneous Q12H Kennedy Bucker, MD      . famotidine (PEPCID) 20 MG tablet           . fentaNYL (SUBLIMAZE) 100 MCG/2ML injection           . magnesium citrate solution 1 Bottle  1  Bottle Oral Once PRN Kennedy Bucker, MD      . magnesium hydroxide (MILK OF MAGNESIA) suspension 30 mL  30 mL Oral Daily PRN Kennedy Bucker, MD   30 mL at 10/30/16 1250  . menthol-cetylpyridinium (CEPACOL) lozenge 3 mg  1 lozenge Oral PRN Kennedy Bucker, MD       Or  . phenol (CHLORASEPTIC) mouth spray 1 spray  1 spray Mouth/Throat PRN Kennedy Bucker, MD      . methocarbamol (ROBAXIN) tablet 500 mg  500 mg Oral Q6H PRN Kennedy Bucker, MD   500 mg at 10/30/16 1250   Or  . methocarbamol (ROBAXIN) 500 mg in dextrose 5 % 50 mL IVPB  500 mg Intravenous Q6H PRN Kennedy Bucker, MD      . metoCLOPramide (REGLAN) tablet 5-10 mg  5-10 mg Oral Q8H PRN Kennedy Bucker, MD       Or  . metoCLOPramide (REGLAN) injection 5-10 mg  5-10 mg Intravenous Q8H PRN Kennedy Bucker, MD      . morphine 2 MG/ML injection 2 mg  2 mg Intravenous Q1H PRN Kennedy Bucker, MD   2 mg at 10/30/16 1255  . ondansetron (ZOFRAN) tablet 4 mg  4 mg Oral Q6H PRN Kennedy Bucker, MD       Or  . ondansetron [facility]) injection 4 mg  4 mg Intravenous Q6H PRN Kennedy Bucker, MD      . oxyCODONE (Oxy IR/ROXICODONE) immediate release tablet 5-10 mg  5-10 mg Oral Q3H PRN Kennedy Bucker, MD   10 mg at 10/30/16 1214  . valACYclovir (VALTREX) tablet 500 mg  500 mg Oral BID Kennedy Bucker, MD      . zolpidem Remus Loffler) tablet 5 mg  5 mg Oral QHS PRN Kennedy Bucker, MD         Discharge Medications: Please see discharge summary for a list of discharge medications.  Relevant Imaging Results:  Relevant Lab Results:   Additional Information  (SSN: [id number])  Hildagarde Holleran, Darleen Crocker, LCSW

## 2016-10-30 NOTE — Evaluation (Signed)
Physical Therapy Evaluation Patient Details Name: Diane AcostaSarah A Castillo MRN: [id number] DOB: 02/12/1971 Today's Date: 10/30/2016   History of Present Illness  Pt admitted for R TKR.  Clinical Impression  Pt is a pleasant 46 year old female who was admitted for R TKR. Pt performs bed mobility with cga, transfers with min assist, and ambulation with cga and RW. Pt able to perform 10 SLRs without assistance, does not require use of KI. Pt demonstrates deficits with strength/pain/ROM/mobility. Pt appears motivated to perform therapy. Would benefit from skilled PT to address above deficits and promote optimal return to PLOF. Recommend transition to HHPT upon discharge from acute hospitalization.       Follow Up Recommendations Home health PT    Equipment Recommendations  Rolling walker with 5" wheels;3in1 (PT)    Recommendations for Other Services       Precautions / Restrictions Precautions Precautions: Fall;Knee Precaution Booklet Issued: No Restrictions Weight Bearing Restrictions: Yes RLE Weight Bearing: Weight bearing as tolerated      Mobility  Bed Mobility Overal bed mobility: Needs Assistance Bed Mobility: Supine to Sit     Supine to sit: Min guard     General bed mobility comments: safe technique with guidance of R LE to ground. Once seated, able to sit with upright posture  Transfers Overall transfer level: Needs assistance Equipment used: Rolling walker (2 wheeled) Transfers: Sit to/from Stand Sit to Stand: Min assist         General transfer comment: able to transfer with use of RW and upright posture.   Ambulation/Gait Ambulation/Gait assistance: Min guard Ambulation Distance (Feet): 3 Feet Assistive device: Rolling walker (2 wheeled) Gait Pattern/deviations: Step-to pattern   Gait velocity interpretation: Below normal speed for age/gender General Gait Details: Pt reports pain and nausea, limited ambulation to recliner. Safe technique and no fatigue  noted.  Stairs            Wheelchair Mobility    Modified Rankin (Stroke Patients Only)       Balance Overall balance assessment: Needs assistance Sitting-balance support: Feet supported Sitting balance-Leahy Scale: Normal     Standing balance support: Bilateral upper extremity supported Standing balance-Leahy Scale: Normal                               Pertinent Vitals/Pain Pain Assessment: 0-10 Pain Score: 9  Pain Location: R knee and complains of nausea Pain Descriptors / Indicators: Operative site guarding Pain Intervention(s): Limited activity within patient's tolerance;RN gave pain meds during session;Ice applied    Home Living Family/patient expects to be discharged to:: Private residence Living Arrangements:  (fiance) Available Help at Discharge: Family;Available 24 hours/day Type of Home: House Home Access: Stairs to enter Entrance Stairs-Rails: None Entrance Stairs-Number of Steps: 1 Home Layout: One level Home Equipment: None      Prior Function Level of Independence: Independent               Hand Dominance        Extremity/Trunk Assessment   Upper Extremity Assessment Upper Extremity Assessment: Overall WFL for tasks assessed    Lower Extremity Assessment Lower Extremity Assessment: Generalized weakness (R LE grossly 3/5; L LE grossly 5/5)       Communication   Communication: No difficulties  Cognition Arousal/Alertness: Awake/alert Behavior During Therapy: WFL for tasks assessed/performed Overall Cognitive Status: Within Functional Limits for tasks assessed  General Comments      Exercises Total Joint Exercises Goniometric ROM: R knee AAROM: 5-80 degrees Other Exercises Other Exercises: Supine ther-ex performed on R LE including ankle pumps, quad sets, SLRs, and hip abd/add. All ther-ex performed x 10 reps with min assist for correct technique    Assessment/Plan    PT Assessment Patient needs continued PT services  PT Problem List Decreased strength;Decreased range of motion;Decreased mobility;Decreased knowledge of use of DME;Pain       PT Treatment Interventions DME instruction;Gait training;Stair training;Therapeutic exercise    PT Goals (Current goals can be found in the Care Plan section)  Acute Rehab PT Goals Patient Stated Goal: to go home PT Goal Formulation: With patient Time For Goal Achievement: 11/13/16 Potential to Achieve Goals: Good    Frequency BID   Barriers to discharge        Co-evaluation               AM-PAC PT "6 Clicks" Daily Activity  Outcome Measure Difficulty turning over in bed (including adjusting bedclothes, sheets and blankets)?: Total Difficulty moving from lying on back to sitting on the side of the bed? : Total Difficulty sitting down on and standing up from a chair with arms (e.g., wheelchair, bedside commode, etc,.)?: Total Help needed moving to and from a bed to chair (including a wheelchair)?: A Little Help needed walking in hospital room?: A Little Help needed climbing 3-5 steps with a railing? : A Lot 6 Click Score: 11    End of Session Equipment Utilized During Treatment: Gait belt Activity Tolerance: Patient tolerated treatment well Patient left: in chair;with chair alarm set Nurse Communication: Mobility status PT Visit Diagnosis: Muscle weakness (generalized) (M62.81);Difficulty in walking, not elsewhere classified (R26.2);Pain Pain - Right/Left: Right Pain - part of body: Knee    Time: 4696-2952 PT Time Calculation (min) (ACUTE ONLY): 28 min   Charges:   PT Evaluation $PT Eval Low Complexity: 1 Procedure PT Treatments $Therapeutic Exercise: 8-22 mins   PT G Codes:        Elizabeth Palau, PT, DPT [phone]   Nidia Grogan 10/30/2016, 5:25 PM

## 2016-10-31 LAB — BASIC METABOLIC PANEL
ANION GAP: 4 — AB (ref 5–15)
BUN: 13 mg/dL (ref 6–20)
CALCIUM: 7.5 mg/dL — AB (ref 8.9–10.3)
CO2: 25 mmol/L (ref 22–32)
Chloride: 108 mmol/L (ref 101–111)
Creatinine, Ser: 0.46 mg/dL (ref 0.44–1.00)
GLUCOSE: 105 mg/dL — AB (ref 65–99)
Potassium: 3.6 mmol/L (ref 3.5–5.1)
Sodium: 137 mmol/L (ref 135–145)

## 2016-10-31 LAB — CBC
HCT: 34.6 % — ABNORMAL LOW (ref 35.0–47.0)
Hemoglobin: 11.6 g/dL — ABNORMAL LOW (ref 12.0–16.0)
MCH: 32.2 pg (ref 26.0–34.0)
MCHC: 33.6 g/dL (ref 32.0–36.0)
MCV: 95.7 fL (ref 80.0–100.0)
PLATELETS: 162 10*3/uL (ref 150–440)
RBC: 3.61 MIL/uL — AB (ref 3.80–5.20)
RDW: 13.4 % (ref 11.5–14.5)
WBC: 6 10*3/uL (ref 3.6–11.0)

## 2016-10-31 MED ORDER — HYDROCODONE-ACETAMINOPHEN 10-325 MG PO TABS
1.0000 | ORAL_TABLET | ORAL | Status: DC | PRN
  Administered 2016-10-31 – 2016-11-02 (×11): 1 via ORAL
  Filled 2016-10-31 (×11): qty 1

## 2016-10-31 NOTE — Plan of Care (Signed)
Problem: Activity: Goal: Ability to avoid complications of mobility impairment will improve Outcome: Progressing Pt is tolerating cpm at 65 degrees

## 2016-10-31 NOTE — Care Management Note (Addendum)
Case Management Note  Patient Details  Name: Diane Castillo MRN: [id number] Date of Birth: 1970/10/27  Subjective/Objective:  POD # 1  Right TKA. Met with patient at bedside. She lives at home with her boyfriend and nephew. She does not qualify for HHPT with Medicaid.  Will need a CPM, walker and BSC. Ordered CPM machine from [employer] with [employer]. Ordered walker and BSC from [place] with [facility]. Pharmacy: [facility]  [phone]. Called lovenox 40 mg # 14 no refills.                Action/Plan: DME ordered, Lovenox called in. Faxed demographics and order for CPM to [facility] at [phone] Expected Discharge Date:  11/02/16               Expected Discharge Plan:  Home/Self Care  In-House Referral:     Discharge planning Services  CM Consult  Post Acute Care Choice:  Durable Medical Equipment, Home Health Choice offered to:  Patient  DME Arranged:  CPM, Walker rolling, 3-N-1 DME Agency:  [place]:    [facility] Agency:     Status of Service:  In process, will continue to follow  If discussed at Long Length of Stay Meetings, dates discussed:    Additional Comments:  Jolly Mango, RN 10/31/2016, 10:53 AM

## 2016-10-31 NOTE — Progress Notes (Signed)
Patient currently at 65 degrees on CPM> tolerating well.

## 2016-10-31 NOTE — Anesthesia Postprocedure Evaluation (Signed)
Anesthesia Post Note  Patient: Diane AcostaSarah A Castillo  Procedure(s) Performed: Procedure(s) (LRB): TOTAL KNEE ARTHROPLASTY (Right)  Patient location during evaluation: Nursing Unit Anesthesia Type: Spinal Level of consciousness: awake, awake and alert and oriented Pain management: pain level controlled Vital Signs Assessment: post-procedure vital signs reviewed and stable Respiratory status: spontaneous breathing, nonlabored ventilation and respiratory function stable Cardiovascular status: stable Postop Assessment: no headache, no backache, no signs of nausea or vomiting and patient able to bend at knees Anesthetic complications: no     Last Vitals:  Vitals:   10/30/16 1603 10/30/16 2357  BP: (!) 138/96 132/82  Pulse: (!) 58 62  Resp:  18  Temp:  37.2 C    Last Pain:  Vitals:   10/31/16 0630  TempSrc:   PainSc: 9                  Mollye Guinta,  Orian Amberg R

## 2016-10-31 NOTE — Progress Notes (Signed)
pts. Foley removed at 0540

## 2016-10-31 NOTE — Progress Notes (Signed)
   Subjective: 1 Day Post-Op Procedure(s) (LRB): TOTAL KNEE ARTHROPLASTY (Right) Patient reports pain as moderate.   Patient is doing well, no nausea or vomiting. Requesting to come off of the morphine due to hallucinations. Currently denies any hallucinations. Denies any CP, SOB, ABD pain. We will continue therapy today.   Objective: Vital signs in last 24 hours: Temp:  [96.8 F (36 C)-98.9 F (37.2 C)] 98.9 F (37.2 C) (05/15 2357) Pulse Rate:  [50-63] 56 (05/16 0804) Resp:  [13-18] 18 (05/16 0804) BP: (114-138)/(73-98) 114/84 (05/16 0804) SpO2:  [99 %-100 %] 100 % (05/16 0804)  Intake/Output from previous day: 05/15 0701 - 05/16 0700 In: 2525 [P.O.:50; I.V.:2325; IV Piggyback:150] Out: 1200 [Urine:1125; Blood:75] Intake/Output this shift: No intake/output data recorded.   Recent Labs  10/30/16 1314 10/31/16 0542  HGB 13.4 11.6*    Recent Labs  10/30/16 1314 10/31/16 0542  WBC 5.9 6.0  RBC 4.24 3.61*  HCT 40.8 34.6*  PLT 178 162    Recent Labs  10/30/16 1314 10/31/16 0542  NA  --  137  K  --  3.6  CL  --  108  CO2  --  25  BUN  --  13  CREATININE 0.68 0.46  GLUCOSE  --  105*  CALCIUM  --  7.5*   No results for input(s): LABPT, INR in the last 72 hours.  EXAM General - Patient is Alert, Appropriate and Oriented Extremity - Neurovascular intact Sensation intact distally Intact pulses distally Dorsiflexion/Plantar flexion intact No cellulitis present Compartment soft Dressing - dressing C/D/I and Wound VAC is intact, canister is full Motor Function - intact, moving foot and toes well on exam.   Past Medical History:  Diagnosis Date  . Asthma   . DVT (deep venous thrombosis) (HCC)    right leg  . Hypertension   . Immune deficiency disorder (HCC)     Assessment/Plan:   1 Day Post-Op Procedure(s) (LRB): TOTAL KNEE ARTHROPLASTY (Right) Active Problems:   Primary localized osteoarthritis of right knee   Acute post op blood loss anemia    Estimated body mass index is 37.94 kg/m as calculated from the following:   Height as of this encounter: 5\' 5"  (1.651 m).   Weight as of this encounter: 103.4 kg (228 lb). Advance diet Up with therapy , continue CPM Acute post op blood loss anemia - recheck labs in the morning Need to change wound VAC canister and plug in wound VAC to be charged Care management to assist with discharge    DVT Prophylaxis - Lovenox, Foot Pumps and TED hose Weight-Bearing as tolerated to right leg   T. [place] Neighborhris Gaines, PA-C [facility] 10/31/2016, 8:27 AM

## 2016-10-31 NOTE — Evaluation (Signed)
Occupational Therapy Evaluation Patient Details Name: Diane AcostaSarah A Ospina MRN: [id number] DOB: 07/14/1970 Today's Date: 10/31/2016    History of Present Illness Pt. was admitted to [facility] for a Right TKR.   Clinical Impression    Pt. Is a 46 y.o. female who was [facility]. Pt. presents with 9/10 pain, lethargy, and limited functional mobility which hinders her ability to perform ADL, and IADL functioning. Pt. could benefit from skilled OT services for ADL training, A/E training, therapeutic activities, therapeutic exercise, and pt. education about home modification, and DME. Pt. Reports she needs a BSCommode, a walker, and a cane. Pt. plans to return home with her boyfriend assist upon discharge.         Follow Up Recommendations  No OT follow up    Equipment Recommendations       Recommendations for Other Services       Precautions / Restrictions Precautions Precautions: Fall Precaution Booklet Issued: Yes (comment) Restrictions Weight Bearing Restrictions: Yes RLE Weight Bearing: Weight bearing as tolerated      Mobility Bed Mobility Overal bed mobility: Needs Assistance Bed Mobility: Supine to Sit     Supine to sit: Min guard     General bed mobility comments: safe technique with guidance of R LE to ground. Once seated, able to sit with upright posture  Transfers Overall transfer level: Needs assistance Equipment used: Rolling walker (2 wheeled) Transfers: Sit to/from Stand Sit to Stand: Min guard         General transfer comment: Safe technique with cues for hand placement prior to standing. RW used. Adjusted RW to height of patient    Balance                                           ADL either performed or assessed with clinical judgement   ADL Overall ADL's : Needs assistance/impaired Eating/Feeding: Set up       Upper Body Bathing: Set up   Lower Body Bathing: Moderate assistance   Upper Body Dressing : Set  up   Lower Body Dressing: Moderate assistance               Functional mobility during ADLs: Min guard General ADL Comments: Pt. education was provided about A/E use for LE ADLs, and home set-up, and anticipated home needs.     Vision         Perception     Praxis      Pertinent Vitals/Pain Pain Assessment: 0-10 Pain Score: 9  Pain Location: Right knee Pain Descriptors / Indicators: Operative site guarding Pain Intervention(s): Limited activity within patient's tolerance;Monitored during session;Repositioned     Hand Dominance Right   Extremity/Trunk Assessment Upper Extremity Assessment Upper Extremity Assessment: Generalized weakness           Communication Communication Communication: No difficulties   Cognition Arousal/Alertness: Awake/alert Behavior During Therapy: WFL for tasks assessed/performed Overall Cognitive Status: Within Functional Limits for tasks assessed                                     General Comments       Exercises Total Joint Exercises Goniometric ROM: R knee AAROM: 4-85 Other Exercises Other Exercises: Supine ther-ex performed on R LE including ankle pumps, quad sets, SLRs,  SAQ, and hip abd/add. All ther-ex performed x 12 reps with min assist for correct technique   Shoulder Instructions      Home Living Family/patient expects to be discharged to:: Private residence Living Arrangements: Spouse/significant other Available Help at Discharge: Family;Available 24 hours/day Type of Home: House Home Access: Stairs to enter [employer] of Steps: 1 Entrance Stairs-Rails: None Home Layout: One level         Firefighter: Standard Bathroom Accessibility: Yes   Home Equipment: None          Prior Functioning/Environment Level of Independence: Independent                 OT Problem List: Decreased strength;Decreased range of motion;Decreased knowledge of use of DME or AE;Pain       OT Treatment/Interventions: Self-care/ADL training;Therapeutic exercise;Energy conservation;Patient/family education;Therapeutic activities;DME and/or AE instruction    OT Goals(Current goals can be found in the care plan section) Acute Rehab OT Goals Patient Stated Goal: to go home ADL Goals Pt Will Perform Lower Body Dressing: with modified independence Pt Will Transfer to Toilet: with modified independence  OT Frequency: Min 2X/week   Barriers to D/C:            Co-evaluation              AM-PAC PT "6 Clicks" Daily Activity     Outcome Measure Help from another person eating meals?: None Help from another person taking care of personal grooming?: None Help from another person toileting, which includes using toliet, bedpan, or urinal?: A Little Help from another person bathing (including washing, rinsing, drying)?: A Lot Help from another person to put on and taking off regular upper body clothing?: None Help from another person to put on and taking off regular lower body clothing?: A Lot 6 Click Score: 19   End of Session Equipment Utilized During Treatment: Gait belt  Activity Tolerance: Patient tolerated treatment well Patient left: in chair;with call bell/phone within reach;with family/visitor present;with nursing/sitter in room  OT Visit Diagnosis: Muscle weakness (generalized) (M62.81)                Time: 4098-1191 OT Time Calculation (min): 23 min Charges:  OT General Charges $OT Visit: 1 Procedure OT Evaluation $OT Eval Moderate Complexity: 1 Procedure G-Codes:     Olegario Messier, MS, OTR/L  Olegario Messier, MS, OTR/L 10/31/2016, 1:07 PM

## 2016-10-31 NOTE — Progress Notes (Signed)
Clinical Social Worker (CSW) received SNF consult. PT is recommending home health. RN case manager aware of above. Please reconsult if future social work needs arise. CSW signing off.   Jaxten Brosh, LCSW [phone] 

## 2016-10-31 NOTE — Progress Notes (Signed)
Physical Therapy Treatment Patient Details Name: Diane AcostaSarah A Villacis MRN: [id number] DOB: 06/30/1970 Today's Date: 10/31/2016    History of Present Illness Pt admitted for R TKR.    PT Comments    Pt is currently making good progress towards goals with increased ambulation distance this session. Slight dizziness noted with ambulation, however seemed to improve with increased distance. Pt with leaking polar care with water in floor noted, cleaned up. RN aware. PT also removed CPM machine prior to therapy. Good endurance with there-ex. Pt continues to be motivated to participate. Will continue to progress.   Follow Up Recommendations  Home health PT     Equipment Recommendations  Rolling walker with 5" wheels;3in1 (PT)    Recommendations for Other Services       Precautions / Restrictions Precautions Precautions: Fall;Knee Precaution Booklet Issued: Yes (comment) Restrictions Weight Bearing Restrictions: Yes RLE Weight Bearing: Weight bearing as tolerated    Mobility  Bed Mobility Overal bed mobility: Needs Assistance Bed Mobility: Supine to Sit     Supine to sit: Min guard     General bed mobility comments: safe technique with guidance of R LE to ground. Once seated, able to sit with upright posture  Transfers Overall transfer level: Needs assistance Equipment used: Rolling walker (2 wheeled) Transfers: Sit to/from Stand Sit to Stand: Min guard         General transfer comment: Safe technique with cues for hand placement prior to standing. RW used. Adjusted RW to height of patient  Ambulation/Gait Ambulation/Gait assistance: Min guard Ambulation Distance (Feet): 80 Feet Assistive device: Rolling walker (2 wheeled) Gait Pattern/deviations: Step-through pattern     General Gait Details: Pt ambulated with reciprocal gait pattern with very slow speed. Pt reports some dizziness with ambulation, however improves with time. Pt cued for upright posture and symmetrical step  length.   Stairs            Wheelchair Mobility    Modified Rankin (Stroke Patients Only)       Balance                                            Cognition Arousal/Alertness: Awake/alert Behavior During Therapy: WFL for tasks assessed/performed Overall Cognitive Status: Within Functional Limits for tasks assessed                                        Exercises Total Joint Exercises Goniometric ROM: R knee AAROM: 4-85 Other Exercises Other Exercises: Supine ther-ex performed on R LE including ankle pumps, quad sets, SLRs, SAQ, and hip abd/add. All ther-ex performed x 12 reps with min assist for correct technique    General Comments        Pertinent Vitals/Pain Pain Assessment: 0-10 Pain Score: 8  Pain Location: R knee and dizziness symptoms Pain Descriptors / Indicators: Operative site guarding Pain Intervention(s): Limited activity within patient's tolerance;Repositioned;Ice applied;Premedicated before session    Home Living                      Prior Function            PT Goals (current goals can now be found in the care plan section) Acute Rehab PT Goals Patient Stated Goal: to go home PT Goal  Formulation: With patient Time For Goal Achievement: 11/13/16 Potential to Achieve Goals: Good Progress towards PT goals: Progressing toward goals    Frequency    BID      PT Plan Current plan remains appropriate    Co-evaluation              AM-PAC PT "6 Clicks" Daily Activity  Outcome Measure  Difficulty turning over in bed (including adjusting bedclothes, sheets and blankets)?: Total Difficulty moving from lying on back to sitting on the side of the bed? : Total Difficulty sitting down on and standing up from a chair with arms (e.g., wheelchair, bedside commode, etc,.)?: Total Help needed moving to and from a bed to chair (including a wheelchair)?: A Little Help needed walking in hospital  room?: A Little Help needed climbing 3-5 steps with a railing? : A Lot 6 Click Score: 11    End of Session Equipment Utilized During Treatment: Gait belt Activity Tolerance: Patient tolerated treatment well Patient left: in chair;with chair alarm set Nurse Communication: Mobility status PT Visit Diagnosis: Muscle weakness (generalized) (M62.81);Difficulty in walking, not elsewhere classified (R26.2);Pain Pain - Right/Left: Right Pain - part of body: Knee     Time: 1610-9604 PT Time Calculation (min) (ACUTE ONLY): 39 min  Charges:  $Gait Training: 23-37 mins $Therapeutic Exercise: 8-22 mins                    G Codes:       Elizabeth Palau, PT, DPT [phone]    Debanhi Blaker 10/31/2016, 10:53 AM

## 2016-10-31 NOTE — Progress Notes (Signed)
CPM on at 65 degrees and Prevena plus canister changed.

## 2016-10-31 NOTE — Progress Notes (Signed)
Physical Therapy Treatment Patient Details Name: Diane Castillo MRN: [id number] DOB: [birth year] Today's Date: 10/31/2016    History of Present Illness Pt. was admitted to [facility] for a Right TKR.    PT Comments    Pt is making good progress towards goals with increased ambulation distance noted this session. Pt fatigues with exertion, however no dizziness reported this session. PT reapplied the CPM post session. Pt remains motivated to perform therapy and demonstrates good endurance with there-ex. Will continue to progress. Needs to perform stair training prior to dc home.   Follow Up Recommendations  Home health PT     Equipment Recommendations  Rolling walker with 5" wheels;3in1 (PT)    Recommendations for Other Services       Precautions / Restrictions Precautions Precautions: Fall Precaution Booklet Issued: Yes (comment) Restrictions Weight Bearing Restrictions: Yes RLE Weight Bearing: Weight bearing as tolerated    Mobility  Bed Mobility Overal bed mobility: Needs Assistance Bed Mobility: Supine to Sit     Supine to sit: Min guard     General bed mobility comments: safe technique with guidance of R LE to ground. Once seated, able to sit with upright posture  Transfers Overall transfer level: Needs assistance Equipment used: Rolling walker (2 wheeled) Transfers: Sit to/from Stand Sit to Stand: Supervision         General transfer comment: no cues required for standing with RW. Upright posture noted  Ambulation/Gait Ambulation/Gait assistance: Min guard Ambulation Distance (Feet): 120 Feet Assistive device: Rolling walker (2 wheeled) Gait Pattern/deviations: Step-through pattern     General Gait Details: Pt ambulated with reciprocal gait pattern with slow speed noted. 1 standing rest break noted for fatigue and pain. No dizziness noted this date, however needed personal fan as she overheats quickly. Upright posture noted   Stairs             Wheelchair Mobility    Modified Rankin (Stroke Patients Only)       Balance                                            Cognition Arousal/Alertness: Awake/alert Behavior During Therapy: WFL for tasks assessed/performed Overall Cognitive Status: Within Functional Limits for tasks assessed                                        Exercises Other Exercises Other Exercises: Supine ther-ex performed on R LE including ankle pumps, LAQ, quad sets, SLRs, SAQ, and hip abd/add. All ther-ex performed x 12 reps with min assist for correct technique    General Comments        Pertinent Vitals/Pain Pain Assessment: 0-10 Pain Score: 8  Pain Location: Right knee Pain Descriptors / Indicators: Operative site guarding Pain Intervention(s): Limited activity within patient's tolerance;Repositioned;Premedicated before session;Ice applied    Home Living                      Prior Function            PT Goals (current goals can now be found in the care plan section) Acute Rehab PT Goals Patient Stated Goal: to go home PT Goal Formulation: With patient Time For Goal Achievement: 11/13/16 Potential to Achieve Goals: Good Progress towards PT goals: Progressing  toward goals    Frequency    BID      PT Plan Current plan remains appropriate    Co-evaluation              AM-PAC PT "6 Clicks" Daily Activity  Outcome Measure  Difficulty turning over in bed (including adjusting bedclothes, sheets and blankets)?: A Little Difficulty moving from lying on back to sitting on the side of the bed? : Total Difficulty sitting down on and standing up from a chair with arms (e.g., wheelchair, bedside commode, etc,.)?: A Little Help needed moving to and from a bed to chair (including a wheelchair)?: A Little Help needed walking in hospital room?: A Little Help needed climbing 3-5 steps with a railing? : A Lot 6 Click Score: 15    End of  Session Equipment Utilized During Treatment: Gait belt Activity Tolerance: Patient tolerated treatment well Patient left: in chair;with chair alarm set Nurse Communication: Mobility status PT Visit Diagnosis: Muscle weakness (generalized) (M62.81);Difficulty in walking, not elsewhere classified (R26.2);Pain Pain - Right/Left: Right Pain - part of body: Knee     Time: [id number] PT Time Calculation (min) (ACUTE ONLY): 46 min  Charges:  $Gait Training: 23-37 mins $Therapeutic Exercise: 8-22 mins                    G Codes:       Elizabeth PalauStephanie Chaley Castellanos, PT, DPT [phone]    Diane Castillo 10/31/2016, 4:37 PM

## 2016-11-01 LAB — CBC
HEMATOCRIT: 35.9 % (ref 35.0–47.0)
Hemoglobin: 12 g/dL (ref 12.0–16.0)
MCH: 31.8 pg (ref 26.0–34.0)
MCHC: 33.3 g/dL (ref 32.0–36.0)
MCV: 95.3 fL (ref 80.0–100.0)
Platelets: 157 10*3/uL (ref 150–440)
RBC: 3.76 MIL/uL — ABNORMAL LOW (ref 3.80–5.20)
RDW: 13.1 % (ref 11.5–14.5)
WBC: 5.4 10*3/uL (ref 3.6–11.0)

## 2016-11-01 LAB — BASIC METABOLIC PANEL
ANION GAP: 6 (ref 5–15)
BUN: 6 mg/dL (ref 6–20)
CALCIUM: 8.6 mg/dL — AB (ref 8.9–10.3)
CO2: 28 mmol/L (ref 22–32)
Chloride: 100 mmol/L — ABNORMAL LOW (ref 101–111)
Creatinine, Ser: 0.75 mg/dL (ref 0.44–1.00)
GFR calc Af Amer: >60 mL/min (ref >60)
GFR calc non Af Amer: >60 mL/min (ref >60)
GLUCOSE: 130 mg/dL — AB (ref 65–99)
Potassium: 3.7 mmol/L (ref 3.5–5.1)
Sodium: 134 mmol/L — ABNORMAL LOW (ref 135–145)

## 2016-11-01 NOTE — Progress Notes (Signed)
Occupational Therapy Treatment Patient Details Name: Diane AcostaSarah A Castillo MRN: [id number] DOB: 10/07/1970 Today's Date: 11/01/2016    History of present illness [facility]. [facility].   OT comments  Pt. Reports 9/10 pain. Upon arrival Pt. reports she is not feeling well, and was waiting for someone to change her bed linens so she could return to bed. Pt. Declined A/E training for LE ADLs today. Pt. was assisted with changing bed linens, and was then assisted back to bed. Pt. education was provided about positioning for comfort. Pt. conitnues to benefit from skilled for ADL training, A/E training, positioning, and pt. education about home modification, and DME.   Follow Up Recommendations  No OT follow up    Equipment Recommendations       Recommendations for Other Services      Precautions / Restrictions Precautions Precautions: Fall Restrictions Weight Bearing Restrictions: Yes RLE Weight Bearing: Weight bearing as tolerated                                                     ADL either performed or assessed with clinical judgement   ADL                                       Functional mobility during ADLs: Supervision/safety     Vision       Perception     Praxis      Cognition Arousal/Alertness: Awake/alert Behavior During Therapy: WFL for tasks assessed/performed Overall Cognitive Status: Within Functional Limits for tasks assessed                                          Exercises     Shoulder Instructions       General Comments      Pertinent Vitals/ Pain       Pain Score: 9  Pain Location: Right knee Pain Descriptors / Indicators: Grimacing Pain Intervention(s): Limited activity within patient's tolerance;Monitored during session;Repositioned;Patient requesting pain meds-RN notified  Home Living                                          Prior  Functioning/Environment              Frequency  Min 2X/week        Progress Toward Goals  OT Goals(current goals can now be found in the care plan section)     Acute Rehab OT Goals Patient Stated Goal: To return home  Plan Discharge plan remains appropriate    Co-evaluation                 AM-PAC PT "6 Clicks" Daily Activity     Outcome Measure   Help from another person eating meals?: None Help from another person taking care of personal grooming?: None Help from another person toileting, which includes using toliet, bedpan, or urinal?: A Little Help from another person bathing (including washing, rinsing, drying)?: A Lot Help from another person to put on and taking off  regular upper body clothing?: None Help from another person to put on and taking off regular lower body clothing?: A Lot 6 Click Score: 19    End of Session Equipment Utilized During Treatment: Gait belt      Activity Tolerance Patient tolerated treatment well   Patient Left with family/visitor present;with call bell/phone within reach;in bed;with bed alarm set   Nurse Communication          Time: 1000-1027 OT Time Calculation (min): 27 min  Charges: OT General Charges $OT Visit: 1 Procedure OT Treatments $Self Care/Home Management : 23-37 mins  Olegario Messier, MS, OTR/L  Olegario Messier, MS, OTR/L 11/01/2016, 12:26 PM

## 2016-11-01 NOTE — Care Management Note (Addendum)
Case Management Note  Patient Details  Name: Diane Castillo MRN: [id number] Date of Birth: 08/13/1970  Subjective/Objective:  Correction to my previous note. Patient does qualify for home PT and would like to [facility]. She will get 8 home health Pt visits. Referral to [facility].   Action/Plan: Cost of Lovenox is $ 3  Expected Discharge Date:  11/02/16               Expected Discharge Plan:  Home/Self Care  In-House Referral:     Discharge planning Services  CM Consult  Post Acute Care Choice:  Durable Medical Equipment, Home Health Choice offered to:  Patient  DME Arranged:  CPM, Walker rolling, 3-N-1 DME Agency:  [facility]  HH Arranged:    HH Agency:     Status of Service:  In process, will continue to follow  If discussed at Long Length of Stay Meetings, dates discussed:    Additional Comments:  Marily MemosLisa M Veer Elamin, RN 11/01/2016, 11:45 AM

## 2016-11-01 NOTE — Progress Notes (Signed)
CPM taken of pt per her request

## 2016-11-01 NOTE — Progress Notes (Signed)
Physical Therapy Treatment Patient Details Name: Diane AcostaSarah A Castillo MRN: [id number] DOB: 01/15/1971 Today's Date: 11/01/2016    History of Present Illness [facility]. was [facility] Right [facility].    PT Comments    Participated in exercises as described below.  Min assist for bed mobility this session.  Pt was able to ambulate around nursing unit x 1 with slow but steady gait.  Fatigued with session and requested to return to bed once finished.  Self-limiting ROM due to pain today 72 degrees flexion.  Primary nurse notified of request for pain medication.   Follow Up Recommendations  Home health PT     Equipment Recommendations  Rolling walker with 5" wheels;3in1 (PT)    Recommendations for Other Services       Precautions / Restrictions Precautions Precautions: Fall Precaution Booklet Issued: Yes (comment) Restrictions Weight Bearing Restrictions: Yes RLE Weight Bearing: Weight bearing as tolerated    Mobility  Bed Mobility Overal bed mobility: Needs Assistance Bed Mobility: Supine to Sit;Sit to Supine     Supine to sit: Min assist Sit to supine: Min assist   General bed mobility comments: For RLE  Transfers Overall transfer level: Needs assistance Equipment used: Rolling walker (2 wheeled) Transfers: Sit to/from Stand Sit to Stand: Min guard         General transfer comment: no cues required for standing with RW. Takes increased effort this date secondary to pain.  Ambulation/Gait Ambulation/Gait assistance: Min guard Ambulation Distance (Feet): 200 Feet Assistive device: Rolling walker (2 wheeled) Gait Pattern/deviations: Step-to pattern;Step-through pattern   Gait velocity interpretation: Below normal speed for age/gender General Gait Details: initially step to but was able to progress to step through but with uneven step lengths and decreased step height          Balance Overall balance assessment: Needs assistance Sitting-balance support: Feet  supported Sitting balance-Leahy Scale: Normal     Standing balance support: Bilateral upper extremity supported Standing balance-Leahy Scale: Good                              Cognition Arousal/Alertness: Awake/alert Behavior During Therapy: WFL for tasks assessed/performed Overall Cognitive Status: Within Functional Limits for tasks assessed                                        Exercises Total Joint Exercises Goniometric ROM: 72 degrees AAROM flexion R knee Other Exercises Other Exercises: Supine ther-ex performed on R LE including ankle pumps, LAQ, quad sets, SLRs, SAQ, and hip abd/add. All ther-ex performed x 15 reps with min assist for correct technique    General Comments        Pertinent Vitals/Pain Pain Assessment: 0-10 Pain Score: 8  Pain Location: Right knee Pain Descriptors / Indicators: Grimacing;Operative site guarding Pain Intervention(s): Limited activity within patient's tolerance;Patient requesting pain meds-RN notified    Home Living                      Prior Function            PT Goals (current goals can now be found in the care plan section) Acute Rehab PT Goals Patient Stated Goal: To return home PT Goal Formulation: With patient Time For Goal Achievement: 11/13/16 Potential to Achieve Goals: Good Progress towards PT goals: Progressing toward  goals    Frequency    BID      PT Plan Current plan remains appropriate    Co-evaluation              AM-PAC PT "6 Clicks" Daily Activity  Outcome Measure  Difficulty turning over in bed (including adjusting bedclothes, sheets and blankets)?: A Little Difficulty moving from lying on back to sitting on the side of the bed? : Total Difficulty sitting down on and standing up from a chair with arms (e.g., wheelchair, bedside commode, etc,.)?: A Little Help needed moving to and from a bed to chair (including a wheelchair)?: A Little Help needed walking  in hospital room?: A Little Help needed climbing 3-5 steps with a railing? : A Lot 6 Click Score: 15    End of Session Equipment Utilized During Treatment: Gait belt Activity Tolerance: Patient tolerated treatment well Patient left: in bed;with call bell/phone within reach;with family/visitor present Nurse Communication: Mobility status PT Visit Diagnosis: Muscle weakness (generalized) (M62.81);Difficulty in walking, not elsewhere classified (R26.2);Pain Pain - Right/Left: Right Pain - part of body: Knee     Time: 1610-9604 PT Time Calculation (min) (ACUTE ONLY): 32 min  Charges:  $Gait Training: 8-22 mins $Therapeutic Exercise: 8-22 mins                    G Codes:       Milanie Rosenfield, PTA 11/01/16, 1:51 PM

## 2016-11-01 NOTE — Progress Notes (Signed)
Physical Therapy Treatment Patient Details Name: Diane Castillo MRN: [id number] DOB: 05-09-1971 Today's Date: 11/01/2016    History of Present Illness Pt. was admitted to [facility] for a Right TKR.    PT Comments    Pt is making good progress towards goals with ability to perform stair training this date. Pt limited by pain this date, unable to progress ambulation. Will need to perform flexion measurement in PM session.   Follow Up Recommendations  Home health PT     Equipment Recommendations  Rolling walker with 5" wheels;3in1 (PT)    Recommendations for Other Services       Precautions / Restrictions Precautions Precautions: Fall Precaution Booklet Issued: Yes (comment) Restrictions Weight Bearing Restrictions: Yes RLE Weight Bearing: Weight bearing as tolerated    Mobility  Bed Mobility Overal bed mobility: Needs Assistance Bed Mobility: Supine to Sit     Supine to sit: Min assist     General bed mobility comments: Safe technique with guidance for R knee. Once seated at EOB, pt able to sit with upright posture.  Transfers Overall transfer level: Needs assistance Equipment used: Rolling walker (2 wheeled) Transfers: Sit to/from Stand Sit to Stand: Min guard         General transfer comment: no cues required for standing with RW. Takes increased effort this date secondary to pain.  Ambulation/Gait Ambulation/Gait assistance: Min guard Ambulation Distance (Feet): 40 Feet Assistive device: Rolling walker (2 wheeled) Gait Pattern/deviations: Step-to pattern     General Gait Details: Pt ambulated with slow step to gait pattern and complains of pain with decreased step length noted on L LE. Pt fatigues quickly, unable to ambulate further, seated rest break noted. Given personal fan as she overheats quickly.   Stairs Stairs: Yes   Stair Management: No rails Number of Stairs: 1 General stair comments: Pt navigated up/down 1 step without railing in forward  technique. Safe technique performed. Therapist demonstrated technique prior to performance.  Wheelchair Mobility    Modified Rankin (Stroke Patients Only)       Balance                                            Cognition Arousal/Alertness: Awake/alert Behavior During Therapy: WFL for tasks assessed/performed Overall Cognitive Status: Within Functional Limits for tasks assessed                                        Exercises Total Joint Exercises Goniometric ROM: R knee AAROM extension -10 degrees; plan to measure flexion in PM. Other Exercises Other Exercises: Supine ther-ex performed on R LE including ankle pumps, LAQ, quad sets, SLRs, SAQ, and hip abd/add. All ther-ex performed x 15 reps with min assist for correct technique    General Comments        Pertinent Vitals/Pain Pain Assessment: 0-10 Pain Score: 10-Worst pain ever Pain Location: Right knee Pain Descriptors / Indicators: Grimacing;Operative site guarding Pain Intervention(s): Limited activity within patient's tolerance;RN gave pain meds during session;Ice applied    Home Living                      Prior Function            PT Goals (current goals can now be found in  the care plan section) Acute Rehab PT Goals Patient Stated Goal: To return home PT Goal Formulation: With patient Time For Goal Achievement: 11/13/16 Potential to Achieve Goals: Good Progress towards PT goals: Progressing toward goals    Frequency    BID      PT Plan Current plan remains appropriate    Co-evaluation              AM-PAC PT "6 Clicks" Daily Activity  Outcome Measure  Difficulty turning over in bed (including adjusting bedclothes, sheets and blankets)?: A Little Difficulty moving from lying on back to sitting on the side of the bed? : Total Difficulty sitting down on and standing up from a chair with arms (e.g., wheelchair, bedside commode, etc,.)?: A  Little Help needed moving to and from a bed to chair (including a wheelchair)?: A Little Help needed walking in hospital room?: A Little Help needed climbing 3-5 steps with a railing? : A Lot 6 Click Score: 15    End of Session Equipment Utilized During Treatment: Gait belt Activity Tolerance: Patient tolerated treatment well Patient left: in chair Nurse Communication: Mobility status PT Visit Diagnosis: Muscle weakness (generalized) (M62.81);Difficulty in walking, not elsewhere classified (R26.2);Pain Pain - Right/Left: Right Pain - part of body: Knee     Time: 0902-0950 PT Time Calculation (min) (ACUTE ONLY): 48 min  Charges:  $Gait Training: 23-37 mins $Therapeutic Exercise: 8-22 mins                    G Codes:       Elizabeth PalauStephanie Braylynn Lewing, PT, DPT [phone]    Rona Tomson 11/01/2016, 1:28 PM

## 2016-11-01 NOTE — Progress Notes (Signed)
   Subjective: 2 Days Post-Op Procedure(s) (LRB): TOTAL KNEE ARTHROPLASTY (Right) Patient reports pain as moderate.   Patient is doing well, no nausea or vomiting.   Denies any CP, SOB, ABD pain. We will continue therapy today.   Objective: Vital signs in last 24 hours: Temp:  [98.4 F (36.9 C)-99 F (37.2 C)] 98.4 F (36.9 C) (05/17 0803) Pulse Rate:  [70-75] 75 (05/17 0803) Resp:  [18] 18 (05/17 0803) BP: (119-140)/(81-88) 140/86 (05/17 0803) SpO2:  [100 %] 100 % (05/17 0803)  Intake/Output from previous day: 05/16 0701 - 05/17 0700 In: 240 [P.O.:240] Out: -  Intake/Output this shift: No intake/output data recorded.   Recent Labs  10/30/16 1314 10/31/16 0542 11/01/16 0518  HGB 13.4 11.6* 12.0    Recent Labs  10/31/16 0542 11/01/16 0518  WBC 6.0 5.4  RBC 3.61* 3.76*  HCT 34.6* 35.9  PLT 162 157    Recent Labs  10/31/16 0542 11/01/16 0518  NA 137 134*  K 3.6 3.7  CL 108 100*  CO2 25 28  BUN 13 6  CREATININE 0.46 0.75  GLUCOSE 105* 130*  CALCIUM 7.5* 8.6*   No results for input(s): LABPT, INR in the last 72 hours.  EXAM General - Patient is Alert, Appropriate and Oriented Extremity - Neurovascular intact Sensation intact distally Intact pulses distally Dorsiflexion/Plantar flexion intact No cellulitis present Compartment soft Dressing - dressing C/D/I and wound vac intact, good suction Motor Function - intact, moving foot and toes well on exam.   Past Medical History:  Diagnosis Date  . Asthma   . DVT (deep venous thrombosis) (HCC)    right leg  . Hypertension   . Immune deficiency disorder (HCC)     Assessment/Plan:   2 Days Post-Op Procedure(s) (LRB): TOTAL KNEE ARTHROPLASTY (Right) Active Problems:   Primary localized osteoarthritis of right knee   Acute post op blood loss anemia   Estimated body mass index is 37.94 kg/m as calculated from the following:   Height as of this encounter: 5\' 5"  (1.651 m).   Weight as of this  encounter: 103.4 kg (228 lb). Advance diet Up with therapy , continue CPM Acute post op blood loss anemia -Hgb improved to 12.0 Care management to assist with discharge, Home with HHPT tomorrow    DVT Prophylaxis - Lovenox, Foot Pumps and TED hose Weight-Bearing as tolerated to right leg   T. Cranston Neighborhris Destyni Hoppel, PA-C [facility] 11/01/2016, 10:07 AM

## 2016-11-02 MED ORDER — OXYCODONE-ACETAMINOPHEN 5-325 MG PO TABS: 1.0000 | ORAL_TABLET | Freq: Four times a day (QID) | ORAL | 0 refills | Status: DC | PRN

## 2016-11-02 MED ORDER — ENOXAPARIN SODIUM 40 MG/0.4ML ~~LOC~~ SOLN: 40.0000 mg | SUBCUTANEOUS | 0 refills | Status: DC

## 2016-11-02 NOTE — Progress Notes (Signed)
Patient is alert and oriented and able to verbalize needs. Medicated for right knee pain at surgical site. Vital signs stable. PIV removed. Discharge instructions gone over with patient at this time. Printed AVS and script for Oxycodone given to patient at this time. Follow up appt set up and patient aware and verbalizes understanding of all discharge instructions. Honeycomb dressing given to patient to bring to next follow up appointment. Belongings packed up. Boyfriend and daughter will transport patient home at this time.   Suzan SlickAlison L Alyria Krack, RN

## 2016-11-02 NOTE — Discharge Summary (Signed)
Physician Discharge Summary  Subjective: 3 Days Post-Op Procedure(s) (LRB): TOTAL KNEE ARTHROPLASTY (Right) Patient reports pain as moderate.   Patient seen in rounds with Dr. Rosita Kea. Patient is well, and has had no acute complaints or problems Patient is ready to go Home with home health physical therapy.  Physician Discharge Summary  Patient ID: Diane Castillo MRN: [id number] DOB/AGE: 1970/08/03 46 y.o.  Admit date: 10/30/2016 Discharge date: 11/02/2016  Admission Diagnoses:  Discharge Diagnoses:  Active Problems:   Primary localized osteoarthritis of right knee   Discharged Condition: fair  Hospital Course: The patient is postop day 3 from a right total knee replacement. The patient has been working with physical therapy and is ambulating 200 feet. The patient has a wound VAC in place which is suctioning appropriately. The patient will be discharged home with the wound VAC to be taken off on Tuesday. Her labs continue to stabilize with hemoglobin of 12.0.  Treatments: surgery:  TOTAL KNEE ARTHROPLASTY (Right)  SURGEON: Leitha Schuller, MD  ASSISTANTS: Cran[place] Neighbor [facility]  ANESTHESIA:   spinal  EBL:  Total I/O In: 1150 [I.V.:1150] Out: 350 [Urine:275; Blood:75]  BLOOD ADMINISTERED:none  DRAINS: none   LOCAL MEDICATIONS USED:  MARCAINE    and OTHER Exparel, Toradol and morphine  SPECIMEN:  No Specimen  DISPOSITION OF SPECIMEN:  N/A  COUNTS:  YES  TOURNIQUET:   90 minutes at 300 mmHg  IMPLANTS: Medacta GMK sphere 5 right femur 4 tibia with 65 mm extension 10 mm insert and 3 patella all components cemented  Discharge Exam: Blood pressure 128/76, pulse 80, temperature 98 F (36.7 C), temperature source Oral, resp. rate 18, height 5\' 5"  (1.651 m), weight 103.4 kg (228 lb), last menstrual period 11/23/2014, SpO2 99 %.   Disposition: 01-Home or Self Care      Signed: Jasir Rother 11/02/2016, 6:27 AM   Objective: Vital signs in last 24 hours: Temp:   [98 F (36.7 C)-98.4 F (36.9 C)] 98 F (36.7 C) (05/17 2321) Pulse Rate:  [75-80] 80 (05/17 2321) Resp:  [18] 18 (05/17 2321) BP: (128-140)/(76-86) 128/76 (05/17 2321) SpO2:  [99 %-100 %] 99 % (05/17 2321)  Intake/Output from previous day:  Intake/Output Summary (Last 24 hours) at 11/02/16 0627 Last data filed at 11/02/16 0225  Gross per 24 hour  Intake           3537.5 ml  Output              300 ml  Net           3237.5 ml    Intake/Output this shift: Total I/O In: 3537.5 [P.O.:600; I.V.:2937.5] Out: -   Labs:  Recent Labs  10/30/16 1314 10/31/16 0542 11/01/16 0518  HGB 13.4 11.6* 12.0    Recent Labs  10/31/16 0542 11/01/16 0518  WBC 6.0 5.4  RBC 3.61* 3.76*  HCT 34.6* 35.9  PLT 162 157    Recent Labs  10/31/16 0542 11/01/16 0518  NA 137 134*  K 3.6 3.7  CL 108 100*  CO2 25 28  BUN 13 6  CREATININE 0.46 0.75  GLUCOSE 105* 130*  CALCIUM 7.5* 8.6*   No results for input(s): LABPT, INR in the last 72 hours.  EXAM: General - Patient is Alert and Oriented Extremity - Neurologically intact Neurovascular intact No cellulitis present Compartment soft Incision - clean, dry, wound VAC in place. Motor Function -  dorsiflexion and plantarflexion are intact. The patient ambulated 200 feet.  Assessment/Plan: 3 Days  Post-Op Procedure(s) (LRB): TOTAL KNEE ARTHROPLASTY (Right) Procedure(s) (LRB): TOTAL KNEE ARTHROPLASTY (Right) Past Medical History:  Diagnosis Date  . Asthma   . DVT (deep venous thrombosis) (HCC)    right leg  . Hypertension   . Immune deficiency disorder (HCC)    Active Problems:   Primary localized osteoarthritis of right knee  Estimated body mass index is 37.94 kg/m as calculated from the following:   Height as of this encounter: 5\' 5"  (1.651 m).   Weight as of this encounter: 103.4 kg (228 lb). Discharge home with home health Diet - Regular diet Follow up - in 2 weeks Activity - WBAT Disposition - Home Condition Upon  Discharge - Stable DVT Prophylaxis - Lovenox and TED hose  Dedra Skeensodd Magnolia Mattila, PA-C Orthopaedic Surgery 11/02/2016, 6:27 AM

## 2016-11-02 NOTE — Progress Notes (Signed)
   Subjective: 3 Days Post-Op Procedure(s) (LRB): TOTAL KNEE ARTHROPLASTY (Right) Patient reports pain as moderate.   Patient is doing well, no nausea or vomiting.   Denies any CP, SOB, ABD pain. We will continue therapy today. She ambulated 200 feet yesterday.  Objective: Vital signs in last 24 hours: Temp:  [98 F (36.7 C)-98.4 F (36.9 C)] 98 F (36.7 C) (05/17 2321) Pulse Rate:  [75-80] 80 (05/17 2321) Resp:  [18] 18 (05/17 2321) BP: (128-140)/(76-86) 128/76 (05/17 2321) SpO2:  [99 %-100 %] 99 % (05/17 2321)  Intake/Output from previous day: 05/17 0701 - 05/18 0700 In: 3537.5 [P.O.:600; I.V.:2937.5] Out: 300 [Urine:300] Intake/Output this shift: Total I/O In: 3537.5 [P.O.:600; I.V.:2937.5] Out: -    Recent Labs  10/30/16 1314 10/31/16 0542 11/01/16 0518  HGB 13.4 11.6* 12.0    Recent Labs  10/31/16 0542 11/01/16 0518  WBC 6.0 5.4  RBC 3.61* 3.76*  HCT 34.6* 35.9  PLT 162 157    Recent Labs  10/31/16 0542 11/01/16 0518  NA 137 134*  K 3.6 3.7  CL 108 100*  CO2 25 28  BUN 13 6  CREATININE 0.46 0.75  GLUCOSE 105* 130*  CALCIUM 7.5* 8.6*   No results for input(s): LABPT, INR in the last 72 hours.  EXAM General - Patient is Alert, Appropriate and Oriented Extremity - Neurovascular intact Sensation intact distally Intact pulses distally Dorsiflexion/Plantar flexion intact No cellulitis present Compartment soft Dressing - dressing C/D/I and wound vac intact, good suction Motor Function - intact, moving foot and toes well on exam.   Past Medical History:  Diagnosis Date  . Asthma   . DVT (deep venous thrombosis) (HCC)    right leg  . Hypertension   . Immune deficiency disorder (HCC)     Assessment/Plan:   3 Days Post-Op Procedure(s) (LRB): TOTAL KNEE ARTHROPLASTY (Right) Active Problems:   Primary localized osteoarthritis of right knee   Acute post op blood loss anemia   Estimated body mass index is 37.94 kg/m as calculated from  the following:   Height as of this encounter: 5\' 5"  (1.651 m).   Weight as of this encounter: 103.4 kg (228 lb). Advance diet Up with therapy , continue CPM Acute post op blood loss anemia -Hgb improved to 12.0 Care management to assist with discharge, Home with HHPT today. The wound VAC will stay on for the next 5 days.    DVT Prophylaxis - Lovenox, Foot Pumps and TED hose Weight-Bearing as tolerated to right leg  Dedra Skeensodd Arriah Wadle, PA-C [facility] 11/02/2016, 6:16 AM

## 2016-11-02 NOTE — Plan of Care (Signed)
Problem: Activity: Goal: Risk for activity intolerance will decrease Outcome: Progressing Pt was able to get up and use the walker with minimal assistance.

## 2016-11-02 NOTE — Care Management Note (Signed)
Case Management Note  Patient Details  Name: Diane Castillo MRN: [id number] Date of Birth: 05/25/1971  Subjective/Objective:   Discharging today                 Action/Plan: [facility] notified of discharge. Med equip has made contact with patient for delivery of CPM. Lovenox is $ 3.    Expected Discharge Date:  11/02/16               Expected Discharge Plan:  Home/Self Care  In-House Referral:     Discharge planning Services  CM Consult  Post Acute Care Choice:  Durable Medical Equipment, Home Health Choice offered to:  Patient  DME Arranged:  CPM, Walker rolling, 3-N-1 DME Agency:  [facility]  [facility]  [facility]  Status of Service:  Completed, signed off  If discussed at Long Length of Stay Meetings, dates discussed:    Additional Comments:  Marily MemosLisa M Mitchelle Sultan, RN 11/02/2016, 9:18 AM

## 2016-11-02 NOTE — Discharge Instructions (Signed)
INSTRUCTIONS AFTER Surgery  o Remove items at home which could result in a fall. This includes throw rugs or furniture in walking pathways o ICE to the affected joint every three hours while awake for 30 minutes at a time, for at least the first 3-5 days, and then as needed for pain and swelling.  Continue to use ice for pain and swelling. You may notice swelling that will progress down to the foot and ankle.  This is normal after surgery.  Elevate your leg when you are not up walking on it.   o Continue to use the breathing machine you got in the hospital (incentive spirometer) which will help keep your temperature down.  It is common for your temperature to cycle up and down following surgery, especially at night when you are not up moving around and exerting yourself.  The breathing machine keeps your lungs expanded and your temperature down.   DIET:  As you were doing prior to hospitalization, we recommend a well-balanced diet.  DRESSING / WOUND CARE / SHOWERING  Keep the surgical dressing until follow up.  The dressing is water proof, so you can shower without any extra covering.  IF THE DRESSING FALLS OFF or the wound gets wet inside, change the dressing with sterile gauze.  Please use good hand washing techniques before changing the dressing.  Do not use any lotions or creams on the incision until instructed by your surgeon.     The wound VAC will stay on until Tuesday. The physical therapist can remove the wound VAC. A honeycomb dressing will be sent home with the patient to apply to the wound.  ACTIVITY  o Increase activity slowly as tolerated, but follow the weight bearing instructions below.   o No driving for 6 weeks or until further direction given by your physician.  You cannot drive while taking narcotics.  o No lifting or carrying greater than 10 lbs. until further directed by your surgeon. o Avoid periods of inactivity such as sitting longer than an hour when not asleep. This  helps prevent blood clots.  o You may return to work once you are authorized by your doctor.     WEIGHT BEARING   Weight bearing as tolerated with assist device (walker, cane, etc) as directed, use it as long as suggested by your surgeon or therapist, typically at least 4-6 weeks.   EXERCISES  Results after joint surgery are often greatly improved when you follow the exercise, range of motion and muscle strengthening exercises prescribed by your doctor. Safety measures are also important to protect the joint from further injury. Any time any of these exercises cause you to have increased pain or swelling, decrease what you are doing until you are comfortable again and then slowly increase them. If you have problems or questions, call your caregiver or physical therapist for advice.   Rehabilitation is important following a joint surgery. After just a few days of immobilization, the muscles of the leg can become weakened and shrink (atrophy).  These exercises are designed to build up the tone and strength of the thigh and leg muscles and to improve motion. Often times heat used for twenty to thirty minutes before working out will loosen up your tissues and help with improving the range of motion but do not use heat for the first two weeks following surgery (sometimes heat can increase post-operative swelling).   These exercises can be done on a training (exercise) mat, on the floor, on  a table or on a bed. Use whatever works the best and is most comfortable for you.    Use music or television while you are exercising so that the exercises are a pleasant break in your day. This will make your life better with the exercises acting as a break in your routine that you can look forward to.   Perform all exercises about fifteen times, three times per day or as directed.  You should exercise both the operative leg and the other leg as well.  Exercises include:    Quad Sets - Tighten up the muscle on the  front of the thigh (Quad) and hold for 5-10 seconds.    Straight Leg Raises - With your knee straight (if you were given a brace, keep it on), lift the leg to 60 degrees, hold for 3 seconds, and slowly lower the leg.  Perform this exercise against resistance later as your leg gets stronger.   Leg Slides: Lying on your back, slowly slide your foot toward your buttocks, bending your knee up off the floor (only go as far as is comfortable). Then slowly slide your foot back down until your leg is flat on the floor again.   Angel Wings: Lying on your back spread your legs to the side as far apart as you can without causing discomfort.   Hamstring Strength:  Lying on your back, push your heel against the floor with your leg straight by tightening up the muscles of your buttocks.  Repeat, but this time bend your knee to a comfortable angle, and push your heel against the floor.  You may put a pillow under the heel to make it more comfortable if necessary.   A rehabilitation program following joint surgery can speed recovery and prevent re-injury in the future due to weakened muscles. Contact your doctor or a physical therapist for more information on knee rehabilitation.    CONSTIPATION  Constipation is defined medically as fewer than three stools per week and severe constipation as less than one stool per week.  Even if you have a regular bowel pattern at home, your normal regimen is likely to be disrupted due to multiple reasons following surgery.  Combination of anesthesia, postoperative narcotics, change in appetite and fluid intake all can affect your bowels.   YOU MUST use at least one of the following options; they are listed in order of increasing strength to get the job done.  They are all available over the counter, and you may need to use some, POSSIBLY even all of these options:    Drink plenty of fluids (prune juice may be helpful) and high fiber foods Colace 100 mg by mouth twice a day   Senokot for constipation as directed and as needed Dulcolax (bisacodyl), take with full glass of water  Miralax (polyethylene glycol) once or twice a day as needed.  If you have tried all these things and are unable to have a bowel movement in the first 3-4 days after surgery call either your surgeon or your primary doctor.    If you experience loose stools or diarrhea, hold the medications until you stool forms back up.  If your symptoms do not get better within 1 week or if they get worse, check with your doctor.  If you experience "the worst abdominal pain ever" or develop nausea or vomiting, please contact the office immediately for further recommendations for treatment.   ITCHING:  If you experience itching with your medications, try  taking only a single pain pill, or even half a pain pill at a time.  You can also use Benadryl over the counter for itching or also to help with sleep.   TED HOSE STOCKINGS:  Use stockings on both legs until for at least 2 weeks or as directed by physician office. They may be removed at night for sleeping.  MEDICATIONS:  See your medication summary on the After Visit Summary that nursing will review with you.  You may have some home medications which will be placed on hold until you complete the course of blood thinner medication.  It is important for you to complete the blood thinner medication as prescribed.  PRECAUTIONS:  If you experience chest pain or shortness of breath - call 911 immediately for transfer to the hospital emergency department.   If you develop a fever greater that 101 F, purulent drainage from wound, increased redness or drainage from wound, foul odor from the wound/dressing, or calf pain - CONTACT YOUR SURGEON.                                                   FOLLOW-UP APPOINTMENTS:  If you do not already have a post-op appointment, please call the office for an appointment to be seen by your surgeon.  Guidelines for how soon to be seen  are listed in your After Visit Summary, but are typically between 1-4 weeks after surgery.  OTHER INSTRUCTIONS:   The wound VAC can be removed on Tuesday. Apply a honeycomb bandage.  MAKE SURE YOU:   Understand these instructions.   Get help right away if you are not doing well or get worse.    Thank you for letting us be a part of your medical care team.  It is a privilege we respect greatly.  We hope these instructions will help you stay on track for a fast and full recovery!

## 2016-11-02 NOTE — Progress Notes (Signed)
Physical Therapy Treatment Patient Details Name: Diane Castillo MRN: [id number] DOB: 1971/02/25 Today's Date: 11/02/2016    History of Present Illness Pt. was admitted to [facility] for a Right TKR.    PT Comments    Pt is making good progress towards goals with acute care goals met and is safe to dc home. Good endurance with there-ex, reviewed written HEP again this date. Safe technique with ambulation in hallway using RW with reciprocal gait pattern performed.  Follow Up Recommendations  Home health PT     Equipment Recommendations  Rolling walker with 5" wheels;3in1 (PT)    Recommendations for Other Services       Precautions / Restrictions Precautions Precautions: Fall Precaution Booklet Issued: Yes (comment) Restrictions Weight Bearing Restrictions: Yes RLE Weight Bearing: Weight bearing as tolerated    Mobility  Bed Mobility Overal bed mobility: Needs Assistance Bed Mobility: Supine to Sit;Sit to Supine     Supine to sit: Min guard Sit to supine: Min assist   General bed mobility comments: needs assist for sequencing of R LE. Assist for sequencing given.  Transfers Overall transfer level: Needs assistance Equipment used: Rolling walker (2 wheeled) Transfers: Sit to/from Stand Sit to Stand: Supervision         General transfer comment: no cues required for sequencing. Used new RW, adjusted to height.  Ambulation/Gait Ambulation/Gait assistance: Supervision Ambulation Distance (Feet): 200 Feet Assistive device: Rolling walker (2 wheeled) Gait Pattern/deviations: Step-through pattern     General Gait Details: Pt able to progress to reciprocal gait, however still demonstrates slow speed with gait. Equal step length achieved.   Stairs            Wheelchair Mobility    Modified Rankin (Stroke Patients Only)       Balance                                            Cognition Arousal/Alertness: Awake/alert Behavior During  Therapy: WFL for tasks assessed/performed Overall Cognitive Status: Within Functional Limits for tasks assessed                                        Exercises Total Joint Exercises Goniometric ROM: R knee AAROM: 5-90 degrees with overpressure given. Other Exercises Other Exercises: Supine ther-ex performed on R LE including ankle pumps, LAQ, quad sets, SLRs, SAQ, and hip abd/add. All ther-ex performed x 15 reps with min assist for correct technique    General Comments        Pertinent Vitals/Pain Pain Assessment: 0-10 Pain Score: 7  Pain Location: Right knee Pain Descriptors / Indicators: Grimacing;Operative site guarding Pain Intervention(s): Limited activity within patient's tolerance;Ice applied    Home Living                      Prior Function            PT Goals (current goals can now be found in the care plan section) Acute Rehab PT Goals Patient Stated Goal: To return home PT Goal Formulation: With patient Time For Goal Achievement: 11/13/16 Potential to Achieve Goals: Good Progress towards PT goals: Progressing toward goals    Frequency    BID      PT Plan Current plan remains appropriate  Co-evaluation              AM-PAC PT "6 Clicks" Daily Activity  Outcome Measure  Difficulty turning over in bed (including adjusting bedclothes, sheets and blankets)?: None Difficulty moving from lying on back to sitting on the side of the bed? : A Little Difficulty sitting down on and standing up from a chair with arms (e.g., wheelchair, bedside commode, etc,.)?: None Help needed moving to and from a bed to chair (including a wheelchair)?: None Help needed walking in hospital room?: None Help needed climbing 3-5 steps with a railing? : A Little 6 Click Score: 22    End of Session Equipment Utilized During Treatment: Gait belt Activity Tolerance: Patient tolerated treatment well Patient left: in bed;with call bell/phone within  reach;with family/visitor present Nurse Communication: Mobility status PT Visit Diagnosis: Muscle weakness (generalized) (M62.81);Difficulty in walking, not elsewhere classified (R26.2);Pain Pain - Right/Left: Right Pain - part of body: Knee     Time: 0177-9390 PT Time Calculation (min) (ACUTE ONLY): 38 min  Charges:  $Gait Training: 23-37 mins $Therapeutic Exercise: 8-22 mins                    G Codes:       Greggory Stallion, PT, DPT [phone]    Lavonne Kinderman 11/02/2016, 10:30 AM

## 2016-11-04 ENCOUNTER — Emergency Department
Admission: EM | Admit: 2016-11-04 | Discharge: 2016-11-04 | Disposition: A | Discharge status: Home / Self Care | Payer: Medicaid Other | Attending: Emergency Medicine | Admitting: Emergency Medicine

## 2016-11-04 DIAGNOSIS — G8918 Other acute postprocedural pain: Secondary | ICD-10-CM | POA: Insufficient documentation

## 2016-11-04 DIAGNOSIS — J45909 Unspecified asthma, uncomplicated: Secondary | ICD-10-CM | POA: Diagnosis not present

## 2016-11-04 DIAGNOSIS — Z96651 Presence of right artificial knee joint: Secondary | ICD-10-CM | POA: Insufficient documentation

## 2016-11-04 DIAGNOSIS — R600 Localized edema: Secondary | ICD-10-CM | POA: Insufficient documentation

## 2016-11-04 DIAGNOSIS — Z7901 Long term (current) use of anticoagulants: Secondary | ICD-10-CM | POA: Insufficient documentation

## 2016-11-04 DIAGNOSIS — Z87891 Personal history of nicotine dependence: Secondary | ICD-10-CM | POA: Diagnosis not present

## 2016-11-04 DIAGNOSIS — Z79899 Other long term (current) drug therapy: Secondary | ICD-10-CM | POA: Insufficient documentation

## 2016-11-04 DIAGNOSIS — M25561 Pain in right knee: Secondary | ICD-10-CM | POA: Diagnosis present

## 2016-11-04 DIAGNOSIS — I10 Essential (primary) hypertension: Secondary | ICD-10-CM | POA: Diagnosis not present

## 2016-11-04 DIAGNOSIS — Z7982 Long term (current) use of aspirin: Secondary | ICD-10-CM | POA: Insufficient documentation

## 2016-11-04 LAB — CBC WITH DIFFERENTIAL/PLATELET
Basophils Absolute: 0 10*3/uL (ref 0–0.1)
Basophils Relative: 0 %
EOS ABS: 0.5 10*3/uL (ref 0–0.7)
Eosinophils Relative: 7 %
HCT: 32.9 % — ABNORMAL LOW (ref 35.0–47.0)
HEMOGLOBIN: 11.3 g/dL — AB (ref 12.0–16.0)
LYMPHS ABS: 1.4 10*3/uL (ref 1.0–3.6)
LYMPHS PCT: 20 %
MCH: 32.6 pg (ref 26.0–34.0)
MCHC: 34.4 g/dL (ref 32.0–36.0)
MCV: 94.8 fL (ref 80.0–100.0)
Monocytes Absolute: 0.7 10*3/uL (ref 0.2–0.9)
Monocytes Relative: 10 %
NEUTROS PCT: 63 %
Neutro Abs: 4.4 10*3/uL (ref 1.4–6.5)
Platelets: 225 10*3/uL (ref 150–440)
RBC: 3.47 MIL/uL — ABNORMAL LOW (ref 3.80–5.20)
RDW: 13.5 % (ref 11.5–14.5)
WBC: 7 10*3/uL (ref 3.6–11.0)

## 2016-11-04 LAB — BASIC METABOLIC PANEL
Anion gap: 6 (ref 5–15)
BUN: 13 mg/dL (ref 6–20)
CHLORIDE: 100 mmol/L — AB (ref 101–111)
CO2: 27 mmol/L (ref 22–32)
Calcium: 9 mg/dL (ref 8.9–10.3)
Creatinine, Ser: 0.59 mg/dL (ref 0.44–1.00)
GFR calc Af Amer: >60 mL/min (ref >60)
GFR calc non Af Amer: >60 mL/min (ref >60)
Glucose, Bld: 106 mg/dL — ABNORMAL HIGH (ref 65–99)
POTASSIUM: 4.1 mmol/L (ref 3.5–5.1)
SODIUM: 133 mmol/L — AB (ref 135–145)

## 2016-11-04 LAB — SEDIMENTATION RATE: Sed Rate: 106 mm/hr — ABNORMAL HIGH (ref 0–20)

## 2016-11-04 MED ORDER — HYDROMORPHONE HCL 1 MG/ML IJ SOLN
1.0000 mg | Freq: Once | INTRAMUSCULAR | Status: AC
  Administered 2016-11-04: 1 mg via INTRAVENOUS

## 2016-11-04 MED ORDER — SODIUM CHLORIDE 0.9 % IV BOLUS (SEPSIS)
500.0000 mL | Freq: Once | INTRAVENOUS | Status: AC
  Administered 2016-11-04: 500 mL via INTRAVENOUS

## 2016-11-04 MED ORDER — HYDROMORPHONE HCL 1 MG/ML IJ SOLN
INTRAMUSCULAR | Status: AC
  Administered 2016-11-04: 1 mg via INTRAVENOUS
  Filled 2016-11-04: qty 1

## 2016-11-04 NOTE — Consult Note (Signed)
ORTHOPAEDIC CONSULTATION  REQUESTING PHYSICIAN: Schuyler Amor, MD  Chief Complaint: Right knee pain  HPI: Diane Castillo is a 46 y.o. female who complains of  Right knee pain. She is 5 days s/p right total knee arthoplasty by Dr. Rudene Christians. Her history is significant for chronic pain.   Past Medical History:  Diagnosis Date  . Asthma   . DVT (deep venous thrombosis) (HCC)    right leg  . Hypertension   . Immune deficiency disorder [facility])    Past Surgical History:  Procedure Laterality Date  . ABDOMINAL HYSTERECTOMY    . CHOLECYSTECTOMY    . HAND SURGERY    . HERNIA REPAIR    . KNEE SURGERY    . ORIF FINGER FRACTURE Left    middle finger  . TUBAL LIGATION     Social History   Social History  . Marital status: Single    Spouse name: N/A  . Number of children: N/A  . Years of education: N/A   Social History Main Topics  . Smoking status: Former Smoker    Packs/day: 0.10    Types: Cigarettes  . Smokeless tobacco: Current User  . Alcohol use No     Comment: socially  . Drug use: Yes    Types: Marijuana     Comment: daily mj - hx of cocaine use 2 years ago  . Sexual activity: Not Asked   Other Topics Concern  . None   Social History Narrative  . None   Family History  Problem Relation Age of Onset  . Heart disease Mother    Allergies  Allergen Reactions  . Penicillins Hives and Swelling    Has patient had a PCN reaction causing immediate rash, facial/tongue/throat swelling, SOB or lightheadedness with hypotension: Yes Has patient had a PCN reaction causing severe rash involving mucus membranes or skin necrosis: No Has patient had a PCN reaction that required hospitalization No Has patient had a PCN reaction occurring within the last 10 years: No If all of the above answers are "NO", then may proceed with Cephalosporin use.  . Tramadol Rash   Prior to Admission medications   Medication Sig Start Date End Date Taking? Authorizing Provider  acetaminophen  (TYLENOL) 500 MG tablet Take 1,000 mg by mouth daily as needed for moderate pain or headache.    [provider]  albuterol (PROVENTIL HFA) 108 (90 Base) MCG/ACT inhaler Inhale 2 puffs into the lungs every 6 (six) hours as needed for wheezing or shortness of breath.  10/03/15   [provider]  aspirin EC 81 MG tablet Take 81 mg by mouth daily.     [provider]  Aspirin-Salicylamide-Caffeine (ARTHRITIS STRENGTH BC POWDER PO) Take 1 packet by mouth daily as needed (pain).    [provider]  brompheniramine-pseudoephedrine-DM 30-2-10 MG/5ML syrup Take 5 mLs by mouth 4 (four) times daily as needed. Patient not taking: Reported on 10/22/2016 06/20/16   Sable Feil, PA-C  diphenhydrAMINE (BENADRYL) 25 MG tablet Take 25 mg by mouth daily as needed for allergies.    [provider]  docusate sodium (COLACE) 100 MG capsule Take 1 capsule (100 mg total) by mouth 2 (two) times daily. Patient taking differently: Take 100 mg by mouth daily.  11/14/15   Theodoro Grist, MD  dolutegravir (TIVICAY) 50 MG tablet Take 50 mg by mouth daily. 05/16/15   [provider]  emtricitabine-tenofovir AF (DESCOVY) 200-25 MG tablet Take 1 tablet by mouth daily.  [provider]  enalapril (VASOTEC) 10 MG tablet Take 10 mg by mouth daily.    [provider]  enoxaparin (LOVENOX) 40 MG/0.4ML injection Inject 0.4 mLs (40 mg total) into the skin daily. 11/02/16   Reche Dixon, PA-C  Menthol, Topical Analgesic, (ICY HOT EX) Apply 1 application topically daily as needed (pain).    [provider]  oxyCODONE-acetaminophen (ROXICET) 5-325 MG tablet Take 1 tablet by mouth every 6 (six) hours as needed for moderate pain. 11/02/16   Reche Dixon, PA-C  valACYclovir (VALTREX) 500 MG tablet Take 500 mg by mouth 2 (two) times daily. 10/03/15   [provider]   WBC-7 ESR-pending CRP- pending  Positive ROS: All other systems have been reviewed and were  otherwise negative with the exception of those mentioned in the HPI and as above.  Physical Exam: AFVSS General: Alert, no acute distress Cardiovascular: No pedal edema Respiratory: No cyanosis, no use of accessory musculature GI: No organomegaly, abdomen is soft and non-tender Skin: No lesions in the area of chief complaint Neurologic: Sensation intact distally Psychiatric: Patient is competent for consent with normal mood and affect Lymphatic: No axillary or cervical lymphadenopathy  MUSCULOSKELETAL: moderate swelling and warmth about the right knee. No erythema, Incisional wound vac was removed and the incision was C/D/I without expressible drainage.  Assessment: Post-operative pain 5 days s/p knee replacement. Patient is afebrile with normal white count and no wound drainage. Acute infection unlikely at this time.  Plan: Follow-up in the ortho clinic Tuesday as scheduled.       11/04/2016 11:25 AM

## 2016-11-04 NOTE — ED Triage Notes (Signed)
Pt arrives to ED from home via [facility] with c/o RIGHT knee pain and swelling following TKR last Tuesday. EMS reports pt states the wound vac in place stopped working yesterday, but the wound vac is functioning normally at arrival. EMS reports pt's VS WNL, pt is A&Ox4, in NAD, with respirations even, regular, and unlabored.

## 2016-11-04 NOTE — ED Provider Notes (Addendum)
[facility] Emergency Department Provider Note  ____________________________________________   I have reviewed the triage vital signs and the nursing notes.   HISTORY  Chief Complaint Knee Pain    HPI Diane Castillo is a 46 y.o. female who presents today complaining of pain and swelling to her surgical site. She hadtotal knee replacement on the right on Tuesday, went home 2 days ago. Does have a wound VAC. Patient states that she's had increased pain this morning, and she has felt sweaty and she thinks the wound VAC isn't draining as much. She also complains of increased swelling to the leg. Denies fever. The patient has no nausea or vomiting. She is taking oxycodone at home and has had minimal relief. She is requesting pain medication.     Past Medical History:  Diagnosis Date  . Asthma   . DVT (deep venous thrombosis) (HCC)    right leg  . Hypertension   . Immune deficiency disorder [facility])     Patient Active Problem List   Diagnosis Date Noted  . Primary localized osteoarthritis of right knee 10/30/2016  . Right leg pain 11/14/2015  . Gastrocnemius muscle strain 11/14/2015  . Hypokalemia 11/14/2015  . Metabolic acidosis 11/14/2015  . Hyperglycemia 11/14/2015  . Drug abuse 11/14/2015  . Rhabdomyolysis 11/13/2015    Past Surgical History:  Procedure Laterality Date  . ABDOMINAL HYSTERECTOMY    . CHOLECYSTECTOMY    . HAND SURGERY    . HERNIA REPAIR    . KNEE SURGERY    . ORIF FINGER FRACTURE Left    middle finger  . TUBAL LIGATION      Prior to Admission medications   Medication Sig Start Date End Date Taking? Authorizing Provider  acetaminophen (TYLENOL) 500 MG tablet Take 1,000 mg by mouth daily as needed for moderate pain or headache.    [provider]  albuterol (PROVENTIL HFA) 108 (90 Base) MCG/ACT inhaler Inhale 2 puffs into the lungs every 6 (six) hours as needed for wheezing or shortness of breath.  10/03/15   [provider]  aspirin EC 81 MG tablet Take 81 mg by mouth daily.     [provider]  Aspirin-Salicylamide-Caffeine (ARTHRITIS STRENGTH BC POWDER PO) Take 1 packet by mouth daily as needed (pain).    [provider]  brompheniramine-pseudoephedrine-DM 30-2-10 MG/5ML syrup Take 5 mLs by mouth 4 (four) times daily as needed. Patient not taking: Reported on 10/22/2016 06/20/16   Joni Reining, PA-C  diphenhydrAMINE (BENADRYL) 25 MG tablet Take 25 mg by mouth daily as needed for allergies.    [provider]  docusate sodium (COLACE) 100 MG capsule Take 1 capsule (100 mg total) by mouth 2 (two) times daily. Patient taking differently: Take 100 mg by mouth daily.  11/14/15   Katharina Caper, MD  dolutegravir (TIVICAY) 50 MG tablet Take 50 mg by mouth daily. 05/16/15   [provider]  emtricitabine-tenofovir AF (DESCOVY) 200-25 MG tablet Take 1 tablet by mouth daily.    [provider]  enalapril (VASOTEC) 10 MG tablet Take 10 mg by mouth daily.    [provider]  enoxaparin (LOVENOX) 40 MG/0.4ML injection Inject 0.4 mLs (40 mg total) into the skin daily. 11/02/16   Dedra Skeens, PA-C  Menthol, Topical Analgesic, (ICY HOT EX) Apply 1 application topically daily as needed (pain).    [provider]  oxyCODONE-acetaminophen (ROXICET) 5-325 MG tablet Take 1 tablet by mouth every 6 (six) hours as needed  for moderate pain. 11/02/16   Dedra SkeensMundy, Todd, PA-C  valACYclovir (VALTREX) 500 MG tablet Take 500 mg by mouth 2 (two) times daily. 10/03/15   [provider]    Allergies Penicillins and Tramadol  Family History  Problem Relation Age of Onset  . Heart disease Mother     Social History Social History  Substance Use Topics  . Smoking status: Former Smoker    Packs/day: 0.10    Types: Cigarettes  . Smokeless tobacco: Current User  . Alcohol use No     Comment: socially    Review of Systems Constitutional: No fever/chillsPositive  for feeling warm subjectively Eyes: No visual changes. ENT: No sore throat. No stiff neck no neck pain Cardiovascular: Denies chest pain. Respiratory: Denies shortness of breath. Gastrointestinal:   no vomiting.  No diarrhea.  No constipation. Genitourinary: Negative for dysuria. Musculoskeletal: See history of present illness Skin: Negative for rash. Neurological: Negative for severe headaches, focal weakness or numbness. 10-point ROS otherwise negative.  ____________________________________________   PHYSICAL EXAM:  VITAL SIGNS: ED Triage Vitals  Enc Vitals Group     BP 11/04/16 0939 (!) 135/100     Pulse Rate 11/04/16 0939 77     Resp 11/04/16 0939 18     Temp 11/04/16 0939 98.4 F (36.9 C)     Temp Source 11/04/16 0939 Oral     SpO2 11/04/16 0939 99 %     Weight 11/04/16 0940 230 lb (104.3 kg)     Height 11/04/16 0940 5\' 5"  (1.651 m)     Head Circumference --      Peak Flow --      Pain Score 11/04/16 0939 10     Pain Loc --      Pain Edu? --      Excl. in GC? --     Constitutional: Alert and oriented. Well appearing and in no acute distress. Eyes: Conjunctivae are normal. PERRL. EOMI. Head: Atraumatic. Nose: No congestion/rhinnorhea. Mouth/Throat: Mucous membranes are moist.  Oropharynx non-erythematous. Cardiovascular: Normal rate, regular rhythm. Grossly normal heart sounds.  Good peripheral circulation. Respiratory: Normal respiratory effort.  No retractions. Lungs CTAB. Abdominal: Soft and nontender. No distention. No guarding no rebound Back:  There is no focal tenderness or step off.  there is no midline tenderness there are no lesions noted. there is no CVA tenderness Musculoskeletal: Wound VAC is in place and seems to be draining serous/sanguinous fluid. There is diffuse swelling around the joint. It is somewhat warm to touch. Distal pulses are present. Morbid obesity limits the exam to some degree. Neurologic:  Normal speech and language. No gross focal  neurologic deficits are appreciated.  Skin:  Skin is warm, dry and intact. No rash noted. Psychiatric: Mood and affect are normal. Speech and behavior are normal.  ____________________________________________   LABS (all labs ordered are listed, but only abnormal results are displayed)  Labs Reviewed  CBC WITH DIFFERENTIAL/PLATELET  BASIC METABOLIC PANEL   ____________________________________________  EKG  I personally interpreted any EKGs ordered by me or triage  ____________________________________________  RADIOLOGY  I reviewed any imaging ordered by me or triage that were performed during my shift and, if possible, patient and/or family made aware of any abnormal findings. ____________________________________________   PROCEDURES  Procedure(s) performed: None  Procedures  Critical Care performed: None  ____________________________________________   INITIAL IMPRESSION / ASSESSMENT AND PLAN / ED COURSE  Pertinent labs & imaging results that were available during my care of the patient were reviewed  by me and considered in my medical decision making (see chart for details).  Patient here for increased pain in her surgical site she has a wound VAC on. It is somewhat warm to touch on on this is going to be normal postop swelling and pain are not, we will check a CBC, she is afebrile here, we will discuss with orthopedic surgery once we get some data back. We'll give her pain medication.  ----------------------------------------- 11:08 AM on 11/04/2016 -----------------------------------------  Discussed with orthopedic surgery Dr. Rodena Medin who very kindly came down and evaluated the patient. He feels is no infection, this time I agree. We will discharge the patient home at orthopedic surgery suggestion with close outpatient follow-up. He did ask for a sedimentation rate and a CRP which orthopedic surgery will follow-up as an outpatient. They understand that I personally  will not follow up on this order. They will take the wound VAC off here in the department prior to discharge. Patient feels much better after pain medication white count is reassuring she is afebrile etcPatient does have a remote history of DVT, she has knee pain, there is no calf pain or swelling, this is postoperative pain around the incision. I do not think that she likely has a DVT. Orthopedic surgery agrees and does not want [place].. Pt comfortable, return precautions and f/u given and understood.    ____________________________________________   FINAL CLINICAL IMPRESSION(S) / ED DIAGNOSES  Final diagnoses:  None      This chart was dictated using voice recognition software.  Despite best efforts to proofread,  errors can occur which can change meaning.      Jeanmarie Plant, MD 11/04/16 1004    Jeanmarie Plant, MD 11/04/16 1109    Jeanmarie Plant, MD 11/04/16 1110    Jeanmarie Plant, MD 11/04/16 [phone]

## 2016-11-04 NOTE — Discharge Instructions (Signed)
If you have a fever, or increased pain in the new or worrisome symptoms include chest pain or shortness of breath, return to the emergency department. Follow closely with orthopedic surgeon.

## 2016-11-04 NOTE — ED Notes (Signed)

## 2016-11-05 LAB — C-REACTIVE PROTEIN: CRP: 12.9 mg/dL — AB (ref <1.0)

## 2016-11-08 ENCOUNTER — Encounter: Payer: Self-pay | Admitting: Emergency Medicine

## 2016-11-08 ENCOUNTER — Emergency Department
Admission: EM | Admit: 2016-11-08 | Discharge: 2016-11-08 | Disposition: A | Discharge status: Home / Self Care | Payer: Medicaid Other | Attending: Emergency Medicine | Admitting: Emergency Medicine

## 2016-11-08 DIAGNOSIS — J45909 Unspecified asthma, uncomplicated: Secondary | ICD-10-CM | POA: Insufficient documentation

## 2016-11-08 DIAGNOSIS — M25561 Pain in right knee: Secondary | ICD-10-CM | POA: Insufficient documentation

## 2016-11-08 DIAGNOSIS — I10 Essential (primary) hypertension: Secondary | ICD-10-CM | POA: Diagnosis not present

## 2016-11-08 DIAGNOSIS — G8918 Other acute postprocedural pain: Secondary | ICD-10-CM | POA: Insufficient documentation

## 2016-11-08 DIAGNOSIS — Z7982 Long term (current) use of aspirin: Secondary | ICD-10-CM | POA: Insufficient documentation

## 2016-11-08 DIAGNOSIS — Z87891 Personal history of nicotine dependence: Secondary | ICD-10-CM | POA: Diagnosis not present

## 2016-11-08 LAB — COMPREHENSIVE METABOLIC PANEL
ALK PHOS: 59 U/L (ref 38–126)
ALT: 32 U/L (ref 14–54)
ANION GAP: 6 (ref 5–15)
AST: 32 U/L (ref 15–41)
Albumin: 3.6 g/dL (ref 3.5–5.0)
BUN: 17 mg/dL (ref 6–20)
CALCIUM: 9.2 mg/dL (ref 8.9–10.3)
CO2: 26 mmol/L (ref 22–32)
Chloride: 103 mmol/L (ref 101–111)
Creatinine, Ser: 0.69 mg/dL (ref 0.44–1.00)
GFR calc non Af Amer: >60 mL/min (ref >60)
Glucose, Bld: 115 mg/dL — ABNORMAL HIGH (ref 65–99)
Potassium: 4 mmol/L (ref 3.5–5.1)
SODIUM: 135 mmol/L (ref 135–145)
TOTAL PROTEIN: 7.5 g/dL (ref 6.5–8.1)
Total Bilirubin: 0.9 mg/dL (ref 0.3–1.2)

## 2016-11-08 LAB — CBC WITH DIFFERENTIAL/PLATELET
BASOS PCT: 1 %
Basophils Absolute: 0 10*3/uL (ref 0–0.1)
Eosinophils Absolute: 0.6 10*3/uL (ref 0–0.7)
Eosinophils Relative: 10 %
HEMATOCRIT: 34.8 % — AB (ref 35.0–47.0)
Hemoglobin: 11.7 g/dL — ABNORMAL LOW (ref 12.0–16.0)
Lymphocytes Relative: 29 %
Lymphs Abs: 1.9 10*3/uL (ref 1.0–3.6)
MCH: 31.7 pg (ref 26.0–34.0)
MCHC: 33.5 g/dL (ref 32.0–36.0)
MCV: 94.5 fL (ref 80.0–100.0)
MONOS PCT: 8 %
Monocytes Absolute: 0.5 10*3/uL (ref 0.2–0.9)
NEUTROS ABS: 3.5 10*3/uL (ref 1.4–6.5)
NEUTROS PCT: 52 %
Platelets: 339 10*3/uL (ref 150–440)
RBC: 3.68 MIL/uL — ABNORMAL LOW (ref 3.80–5.20)
RDW: 13.2 % (ref 11.5–14.5)
WBC: 6.6 10*3/uL (ref 3.6–11.0)

## 2016-11-08 LAB — LACTIC ACID, PLASMA: Lactic Acid, Venous: 0.8 mmol/L (ref 0.5–1.9)

## 2016-11-08 NOTE — ED Triage Notes (Signed)
Pt presents with right knee pain since yesterday. She had tkr 9 days ago and was seen here 4 days ago for the same. Pt states that the pain medication is not working and that she needs help. Pt reports pain 10/10. Pt has respirations that are even, regular, unlabored, although she occasionally moans and rocks back and forth.

## 2016-11-08 NOTE — ED Provider Notes (Signed)
[facility] Emergency Department Provider Note   ____________________________________________   I have reviewed the triage vital signs and the nursing notes.   HISTORY  Chief Complaint Post-op Problem   History limited by: Not Limited   HPI Diane Castillo is a 46 y.o. female who presents to the emergency department today because of concerns for right knee pain and blood on gauze. The patient had a total right knee performed roughly 9 days ago. She has been having issues with postoperative pain since then. She comes in today continuing to complain of pain. Additionally see states that she has continued to have some bleeding. She states she is on blood thinners secondary to blood clots. She denies any fevers.  Past Medical History:  Diagnosis Date  . Asthma   . DVT (deep venous thrombosis) (HCC)    right leg  . Hypertension   . Immune deficiency disorder [facility])     Patient Active Problem List   Diagnosis Date Noted  . Primary localized osteoarthritis of right knee 10/30/2016  . Right leg pain 11/14/2015  . Gastrocnemius muscle strain 11/14/2015  . Hypokalemia 11/14/2015  . Metabolic acidosis 11/14/2015  . Hyperglycemia 11/14/2015  . Drug abuse 11/14/2015  . Rhabdomyolysis 11/13/2015    Past Surgical History:  Procedure Laterality Date  . ABDOMINAL HYSTERECTOMY    . CHOLECYSTECTOMY    . HAND SURGERY    . HERNIA REPAIR    . KNEE SURGERY    . ORIF FINGER FRACTURE Left    middle finger  . TUBAL LIGATION      Prior to Admission medications   Medication Sig Start Date End Date Taking? Authorizing Provider  acetaminophen (TYLENOL) 500 MG tablet Take 1,000 mg by mouth daily as needed for moderate pain or headache.    [provider]  albuterol (PROVENTIL HFA) 108 (90 Base) MCG/ACT inhaler Inhale 2 puffs into the lungs every 6 (six) hours as needed for wheezing or shortness of breath.  10/03/15   [provider]  aspirin EC 81 MG  tablet Take 81 mg by mouth daily.     [provider]  Aspirin-Salicylamide-Caffeine (ARTHRITIS STRENGTH BC POWDER PO) Take 1 packet by mouth daily as needed (pain).    [provider]  brompheniramine-pseudoephedrine-DM 30-2-10 MG/5ML syrup Take 5 mLs by mouth 4 (four) times daily as needed. Patient not taking: Reported on 10/22/2016 06/20/16   Joni Reining, PA-C  diphenhydrAMINE (BENADRYL) 25 MG tablet Take 25 mg by mouth daily as needed for allergies.    [provider]  docusate sodium (COLACE) 100 MG capsule Take 1 capsule (100 mg total) by mouth 2 (two) times daily. Patient taking differently: Take 100 mg by mouth daily.  11/14/15   Katharina Caper, MD  dolutegravir (TIVICAY) 50 MG tablet Take 50 mg by mouth daily. 05/16/15   [provider]  emtricitabine-tenofovir AF (DESCOVY) 200-25 MG tablet Take 1 tablet by mouth daily.    [provider]  enalapril (VASOTEC) 10 MG tablet Take 10 mg by mouth daily.    [provider]  enoxaparin (LOVENOX) 40 MG/0.4ML injection Inject 0.4 mLs (40 mg total) into the skin daily. 11/02/16   Dedra Skeens, PA-C  Menthol, Topical Analgesic, (ICY HOT EX) Apply 1 application topically daily as needed (pain).    [provider]  oxyCODONE-acetaminophen (ROXICET) 5-325 MG tablet Take 1 tablet by mouth every 6 (six) hours as needed for moderate pain. 11/02/16   Dedra Skeens, PA-C  valACYclovir (VALTREX) 500 MG tablet Take 500 mg by mouth 2 (two) times daily. 10/03/15   [provider]    Allergies Penicillins and Tramadol  Family History  Problem Relation Age of Onset  . Heart disease Mother     Social History Social History  Substance Use Topics  . Smoking status: Former Smoker    Packs/day: 0.10    Types: Cigarettes  . Smokeless tobacco: Current User  . Alcohol use No     Comment: socially    Review of Systems Constitutional: No fever/chills Eyes: No visual changes. ENT: No sore  throat. Cardiovascular: Denies chest pain. Respiratory: Denies shortness of breath. Gastrointestinal: No abdominal pain.  No nausea, no vomiting.  No diarrhea.   Genitourinary: Negative for dysuria. Musculoskeletal: Positive for right knee pain. Skin: Negative for rash. Neurological: Negative for headaches, focal weakness or numbness.  ____________________________________________   PHYSICAL EXAM:  VITAL SIGNS: ED Triage Vitals  Enc Vitals Group     BP 11/08/16 1852 (!) 133/91     Pulse Rate 11/08/16 1852 71     Resp 11/08/16 1852 20     Temp 11/08/16 1852 98.7 F (37.1 C)     Temp Source 11/08/16 1852 Oral     SpO2 11/08/16 1852 100 %     Weight 11/08/16 1852 187 lb (84.8 kg)     Height 11/08/16 1852 5\' 5"  (1.651 m)     Head Circumference --      Peak Flow --      Pain Score 11/08/16 1851 10   Constitutional: Alert and oriented. Well appearing and in no distress. Eyes: Conjunctivae are normal.  ENT   Head: Normocephalic and atraumatic.   Nose: No congestion/rhinnorhea.   Mouth/Throat: Mucous membranes are moist.   Neck: No stridor. Hematological/Lymphatic/Immunilogical: No cervical lymphadenopathy. Cardiovascular: Normal rate, regular rhythm.  No murmurs, rubs, or gallops.  Respiratory: Normal respiratory effort without tachypnea nor retractions. Breath sounds are clear and equal bilaterally. No wheezes/rales/rhonchi. Gastrointestinal: Soft and non tender. No rebound. No guarding.  Genitourinary: Deferred Musculoskeletal: No swelling to right knee. No obvious erythema around incision site. DP 2+ in that leg.  Neurologic:  Normal speech and language. No gross focal neurologic deficits are appreciated.  Skin:  Skin is warm, dry and intact. No rash noted. Psychiatric: Mood and affect are normal. Speech and behavior are normal. Patient exhibits appropriate insight and judgment.  ____________________________________________    LABS (pertinent  positives/negatives)  Labs Reviewed  COMPREHENSIVE METABOLIC PANEL - Abnormal; Notable for the following:       Result Value   Glucose, Bld 115 (*)    All other components within normal limits  CBC WITH DIFFERENTIAL/PLATELET - Abnormal; Notable for the following:    RBC 3.68 (*)    Hemoglobin 11.7 (*)    HCT 34.8 (*)    All other components within normal limits  LACTIC ACID, PLASMA  LACTIC ACID, PLASMA  URINALYSIS, COMPLETE (UACMP) WITH MICROSCOPIC     ____________________________________________   EKG  None  ____________________________________________    RADIOLOGY  None  ____________________________________________   PROCEDURES  Procedures  ____________________________________________   INITIAL IMPRESSION / ASSESSMENT AND PLAN / ED COURSE  Pertinent labs & imaging results that were available during my care of the patient were reviewed by me and considered in my medical decision making (see chart for details).  Patient presented to the emergency department today because of concerns for continued right knee pain after total knee. She also had some  concerns for blood on the gauze. I discussed with patient that she is on blood thinner so we would expect some bleeding. Because his bloody. Discussed with patient that we could change bandages however she stated that the orthopedic doctor asked her not to change bandages until following up in their clinic. At this point discussed risk of infection appear to change bandages. Patient feels comfortable deferring until follow-up with orthopedic surgery.  ____________________________________________   FINAL CLINICAL IMPRESSION(S) / ED DIAGNOSES  Final diagnoses:  Post-operative pain     Note: This dictation was prepared with Dragon dictation. Any transcriptional errors that result from this process are unintentional     Phineas SemenGoodman, Tarri Guilfoil, MD 11/08/16 2100

## 2016-11-08 NOTE — Discharge Instructions (Signed)
Please seek medical attention for any high fevers, chest pain, shortness of breath, change in behavior, persistent vomiting, bloody stool or any other new or concerning symptoms.  

## 2016-11-08 NOTE — ED Notes (Signed)
Pt was informed by Dr. Derrill KayGoodman that he was writing her DC papers. Pt significant other wheeled pt to restroom to urinate. Pt was wheeled back to room 34. When this RN was handed DC papers pt was no longer in room, no wheelchair, no significant other to be found. DC papers were not gone over with pt, vitals were not obtained. Pain score not obtained.

## 2016-11-08 NOTE — ED Notes (Signed)
Pt very agitated and argumentative with this nurse - she is stating that she should not have to wait to see a doctor because she is in pain and pt is demanding to move to the other side and be seen faster - advised pt that she would not be seen any faster on the main side - she is yelling at this nurse and demanding pain medication

## 2016-11-08 NOTE — ED Notes (Signed)
Pt reports she had right knee replacement Tuesday the 15th and she states that her pain is out of control - pt feels like her dressing has to much blood on it and that she has an infection - she states she is unable to do exercises because of the pain and that something just does not feel right - pt reports that she called her provider twice and they gave her Robaxin and toradol but that she is not getting relief - pt states she came by ems Sunday and the ER removed the drain and changed her dressing

## 2016-11-24 IMAGING — CR DG KNEE COMPLETE 4+V*R*
1 series · 5 of 5 positions shown · non-contrast
Comparison: 04/10/2016

CLINICAL DATA: Right knee pain with injury

EXAM:
RIGHT KNEE - COMPLETE 4+ VIEW

[Series 1: t knee ap right · 0.14mm/px · 5 of 5 slices shown]
[im 1/5]
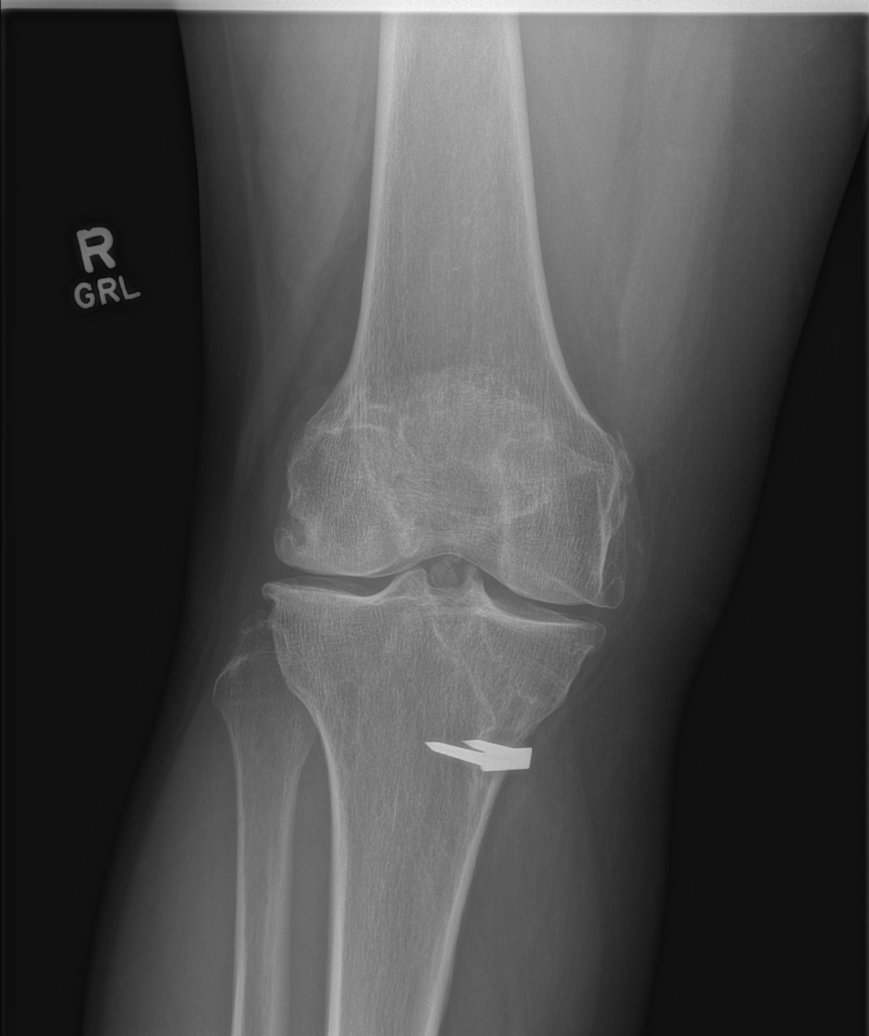
[im 2/5]
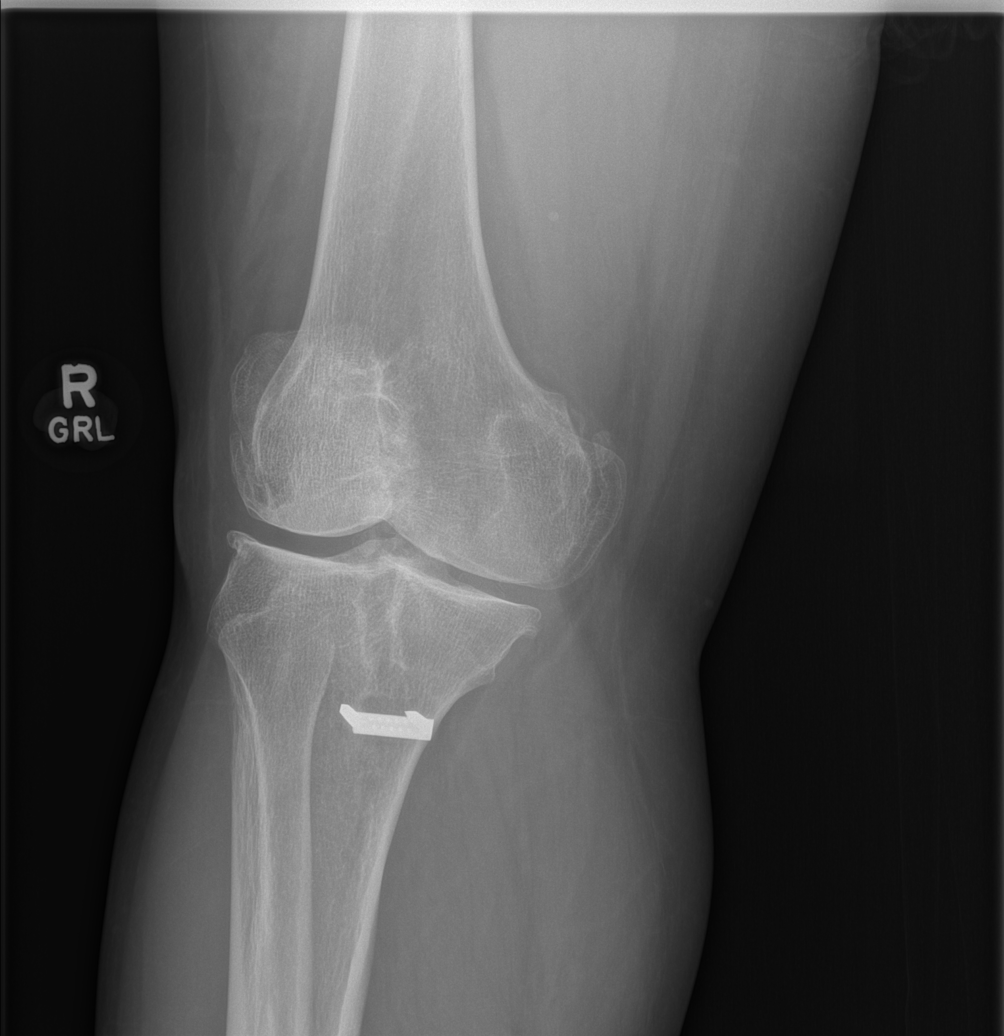
[im 3/5]
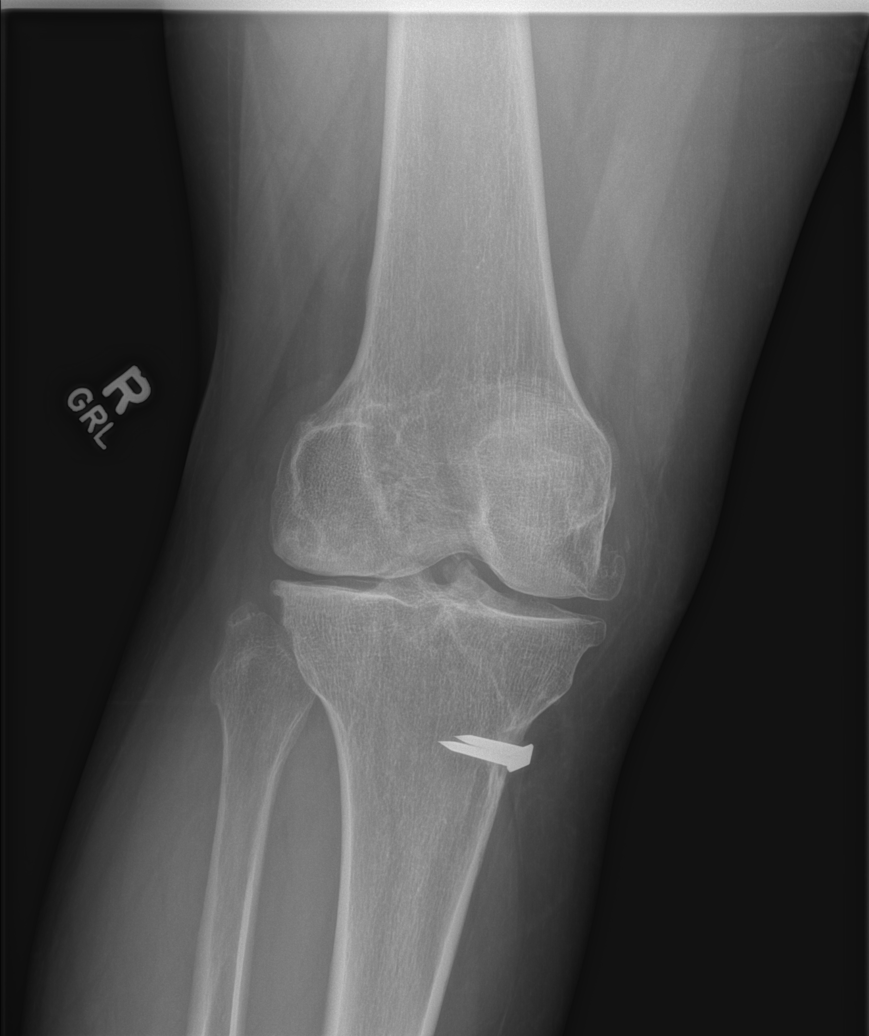
[im 4/5]
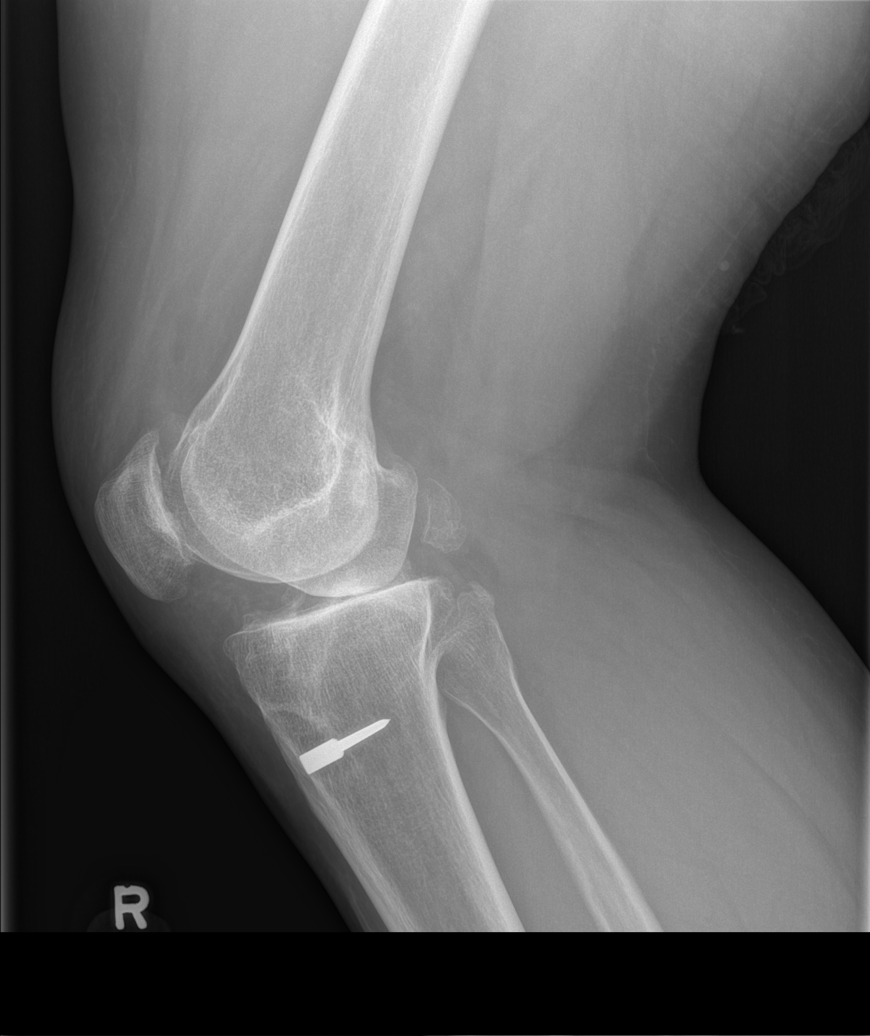
[im 5/5]
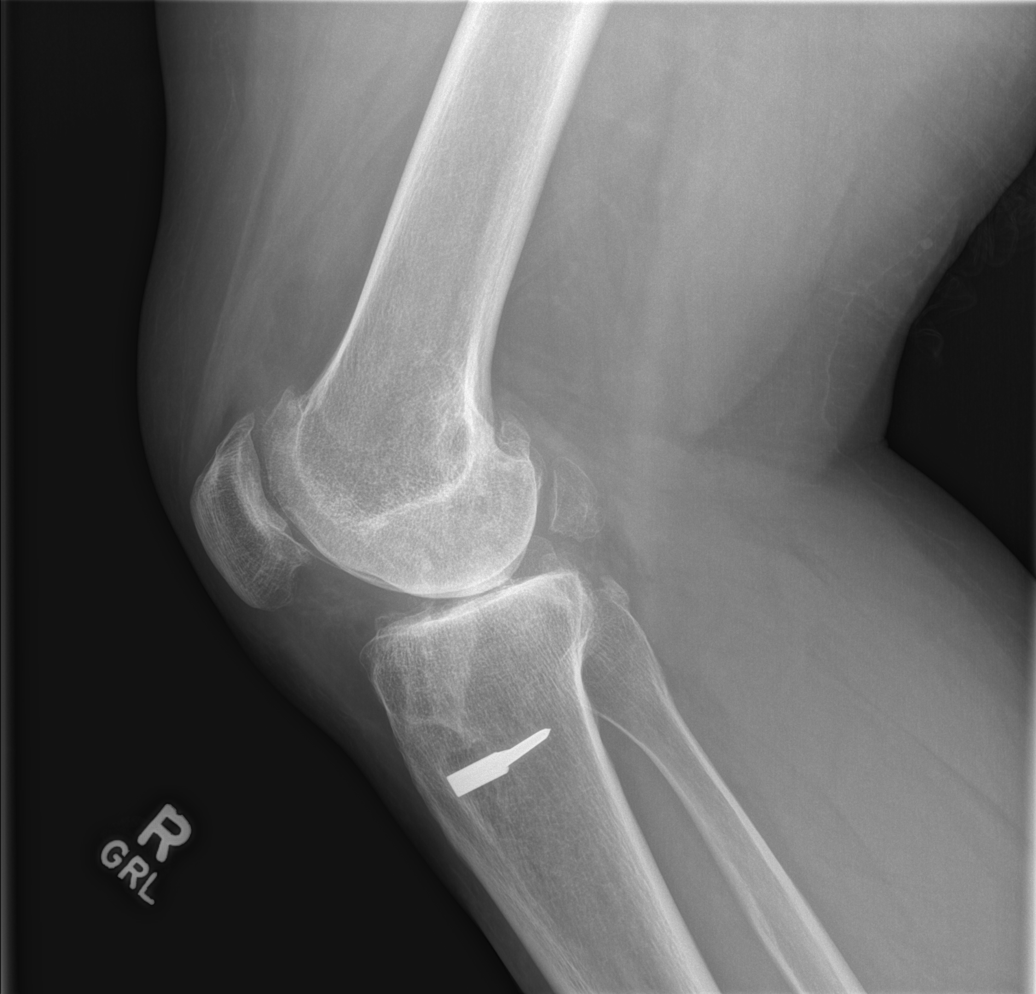

[5 of 5 positions shown; findings below may reference images not displayed]

FINDINGS: No acute fracture or malalignment. Patient is status post ACL repair
with similar alignment, metallic anchor in the proximal tibia is
similar in alignment. Mild to moderate narrowing of the medial
compartment with osteophyte. Mild narrowing of the lateral
compartment with osteophyte. Ovoid opacities projecting over the
central joint space on the frontal view are unchanged and could
relate to small loose bodies. There is patellofemoral narrowing with
superior and inferior osteophytes. Trace joint effusion.
IMPRESSION: Postsurgical changes of the right knee along with moderate
degenerative changes. No definite acute osseous abnormality.

## 2016-12-03 ENCOUNTER — Encounter
Admission: RE | Admit: 2016-12-03 | Discharge: 2016-12-03 | Disposition: A | Discharge status: Home / Self Care | Payer: Medicaid Other | Source: Ambulatory Visit | Attending: Orthopedic Surgery | Admitting: Orthopedic Surgery

## 2016-12-03 DIAGNOSIS — Z01812 Encounter for preprocedural laboratory examination: Secondary | ICD-10-CM | POA: Diagnosis not present

## 2016-12-03 DIAGNOSIS — M1711 Unilateral primary osteoarthritis, right knee: Secondary | ICD-10-CM | POA: Diagnosis not present

## 2016-12-03 LAB — URINE DRUG SCREEN, QUALITATIVE (ARMC ONLY)
AMPHETAMINES, UR SCREEN: NOT DETECTED
BENZODIAZEPINE, UR SCRN: NOT DETECTED
Barbiturates, Ur Screen: NOT DETECTED
Cannabinoid 50 Ng, Ur ~~LOC~~: POSITIVE — AB
Cocaine Metabolite,Ur ~~LOC~~: NOT DETECTED
MDMA (Ecstasy)Ur Screen: NOT DETECTED
Methadone Scn, Ur: NOT DETECTED
OPIATE, UR SCREEN: NOT DETECTED
PHENCYCLIDINE (PCP) UR S: NOT DETECTED
Tricyclic, Ur Screen: NOT DETECTED

## 2016-12-05 ENCOUNTER — Inpatient Hospital Stay: Admission: RE | Admit: 2016-12-05 | Payer: Medicaid Other | Source: Ambulatory Visit

## 2016-12-06 ENCOUNTER — Ambulatory Visit: Payer: Medicaid Other | Admitting: Anesthesiology

## 2016-12-06 ENCOUNTER — Encounter: Payer: Self-pay | Admitting: *Deleted

## 2016-12-06 ENCOUNTER — Ambulatory Visit
Admission: RE | Admit: 2016-12-06 | Discharge: 2016-12-06 | Disposition: A | Discharge status: Home / Self Care | Payer: Medicaid Other | Source: Ambulatory Visit | Attending: Orthopedic Surgery | Admitting: Orthopedic Surgery

## 2016-12-06 ENCOUNTER — Encounter
Admission: RE | Disposition: A | Discharge status: Home / Self Care | Payer: Self-pay | Source: Ambulatory Visit | Attending: Orthopedic Surgery

## 2016-12-06 DIAGNOSIS — Z801 Family history of malignant neoplasm of trachea, bronchus and lung: Secondary | ICD-10-CM | POA: Diagnosis not present

## 2016-12-06 DIAGNOSIS — J45909 Unspecified asthma, uncomplicated: Secondary | ICD-10-CM | POA: Insufficient documentation

## 2016-12-06 DIAGNOSIS — Z9049 Acquired absence of other specified parts of digestive tract: Secondary | ICD-10-CM | POA: Insufficient documentation

## 2016-12-06 DIAGNOSIS — Z86718 Personal history of other venous thrombosis and embolism: Secondary | ICD-10-CM | POA: Diagnosis not present

## 2016-12-06 DIAGNOSIS — Z79899 Other long term (current) drug therapy: Secondary | ICD-10-CM | POA: Insufficient documentation

## 2016-12-06 DIAGNOSIS — Z96651 Presence of right artificial knee joint: Secondary | ICD-10-CM | POA: Insufficient documentation

## 2016-12-06 DIAGNOSIS — D849 Immunodeficiency, unspecified: Secondary | ICD-10-CM | POA: Diagnosis not present

## 2016-12-06 DIAGNOSIS — Z833 Family history of diabetes mellitus: Secondary | ICD-10-CM | POA: Diagnosis not present

## 2016-12-06 DIAGNOSIS — Z8249 Family history of ischemic heart disease and other diseases of the circulatory system: Secondary | ICD-10-CM | POA: Insufficient documentation

## 2016-12-06 DIAGNOSIS — Y838 Other surgical procedures as the cause of abnormal reaction of the patient, or of later complication, without mention of misadventure at the time of the procedure: Secondary | ICD-10-CM | POA: Diagnosis not present

## 2016-12-06 DIAGNOSIS — Z8489 Family history of other specified conditions: Secondary | ICD-10-CM | POA: Diagnosis not present

## 2016-12-06 DIAGNOSIS — M24661 Ankylosis, right knee: Secondary | ICD-10-CM | POA: Insufficient documentation

## 2016-12-06 DIAGNOSIS — I10 Essential (primary) hypertension: Secondary | ICD-10-CM | POA: Diagnosis not present

## 2016-12-06 DIAGNOSIS — Z7982 Long term (current) use of aspirin: Secondary | ICD-10-CM | POA: Diagnosis not present

## 2016-12-06 DIAGNOSIS — M9689 Other intraoperative and postprocedural complications and disorders of the musculoskeletal system: Secondary | ICD-10-CM | POA: Diagnosis not present

## 2016-12-06 HISTORY — PX: KNEE CLOSED REDUCTION: SHX995

## 2016-12-06 LAB — URINE DRUG SCREEN, QUALITATIVE (ARMC ONLY)
Amphetamines, Ur Screen: NOT DETECTED
Barbiturates, Ur Screen: NOT DETECTED
Benzodiazepine, Ur Scrn: NOT DETECTED
Cannabinoid 50 Ng, Ur ~~LOC~~: POSITIVE — AB
Cocaine Metabolite,Ur ~~LOC~~: NOT DETECTED
MDMA (Ecstasy)Ur Screen: NOT DETECTED
METHADONE SCREEN, URINE: NOT DETECTED
Opiate, Ur Screen: NOT DETECTED
PHENCYCLIDINE (PCP) UR S: NOT DETECTED
TRICYCLIC, UR SCREEN: NOT DETECTED

## 2016-12-06 SURGERY — MANIPULATION, KNEE, CLOSED
Anesthesia: General | Laterality: Right

## 2016-12-06 MED ORDER — OXYCODONE HCL 5 MG PO TABS: 5.0000 mg | ORAL_TABLET | ORAL | 0 refills | Status: DC | PRN

## 2016-12-06 MED ORDER — FAMOTIDINE 20 MG PO TABS
20.0000 mg | ORAL_TABLET | Freq: Once | ORAL | Status: AC
  Administered 2016-12-06: 20 mg via ORAL

## 2016-12-06 MED ORDER — PROPOFOL 10 MG/ML IV BOLUS
INTRAVENOUS | Status: DC | PRN
Start: 2016-12-06 — End: 2016-12-06
  Administered 2016-12-06 (×3): 50 mg via INTRAVENOUS

## 2016-12-06 MED ORDER — FENTANYL CITRATE (PF) 100 MCG/2ML IJ SOLN
25.0000 ug | INTRAMUSCULAR | Status: DC | PRN
  Administered 2016-12-06 (×2): 50 ug via INTRAVENOUS

## 2016-12-06 MED ORDER — FENTANYL CITRATE (PF) 100 MCG/2ML IJ SOLN
INTRAMUSCULAR | Status: AC
  Filled 2016-12-06: qty 2

## 2016-12-06 MED ORDER — LACTATED RINGERS IV SOLN
INTRAVENOUS | Status: DC
  Administered 2016-12-06: 20 mL/h via INTRAVENOUS

## 2016-12-06 MED ORDER — PROMETHAZINE HCL 25 MG/ML IJ SOLN: 6.2500 mg | INTRAMUSCULAR | Status: DC | PRN

## 2016-12-06 MED ORDER — FAMOTIDINE 20 MG PO TABS
ORAL_TABLET | ORAL | Status: AC
  Administered 2016-12-06: 20 mg via ORAL
  Filled 2016-12-06: qty 1

## 2016-12-06 MED ORDER — SODIUM CHLORIDE 0.9 % IV SOLN
INTRAVENOUS | Status: DC | PRN
  Administered 2016-12-06: 13:00:00 via INTRAVENOUS

## 2016-12-06 MED ORDER — PROPOFOL 10 MG/ML IV BOLUS
INTRAVENOUS | Status: AC
  Filled 2016-12-06: qty 20

## 2016-12-06 SURGICAL SUPPLY — 1 items: KIT RM TURNOVER STRD PROC AR (KITS) ×3 IMPLANT

## 2016-12-06 NOTE — Op Note (Signed)
12/06/2016  12:55 PM  PATIENT:  Diane Castillo  46 y.o. female  PRE-OPERATIVE DIAGNOSIS:  STATUS POST TOTAL RIGHT KNEE REPLACEMENT, arthrofibrosis  POST-OPERATIVE DIAGNOSIS:  STATUS POST TOTAL RIGHT KNEE REPLACEMENT, arthrofibrosis  PROCEDURE:  Procedure(s): CLOSED MANIPULATION KNEE (Right)  SURGEON: Laurene Footman, MD  ASSISTANTS: None  ANESTHESIA:   MAC  EBL:  Total I/O In: 700 [I.V.:700] Out: -   BLOOD ADMINISTERED:none  DRAINS: none   LOCAL MEDICATIONS USED:  NONE  SPECIMEN:  No Specimen  DISPOSITION OF SPECIMEN:  N/A  COUNTS:  NO Closed procedure performed  TOURNIQUET:  * No tourniquets in log *  IMPLANTS: None  DICTATION: .Dragon Dictation patient brought the operating room and after adequate sedation was obtained appropriate patient identification and timeout procedure completed. The knee was brought up into flexion and passively she was at 70 of flexion with gentle pressure along the proximal tibia the knee could be flexed back to 115 with audible and palpable popping of adhesions. The knee could be brought out to full extension after the manipulation was completed the patient center comes stable condition  PLAN OF CARE: Discharge to home after PACU  PATIENT DISPOSITION:  PACU - hemodynamically stable.

## 2016-12-06 NOTE — Anesthesia Post-op Follow-up Note (Cosign Needed)
Anesthesia QCDR form completed.        

## 2016-12-06 NOTE — Discharge Instructions (Signed)
Work hard on getting range of motion. When in bed keep the bone foam under the heel to try to get the leg out all the way straight. Pain medicine as directed

## 2016-12-06 NOTE — H&P (Signed)
Reviewed paper H+P, will be scanned into chart. No changes noted.  

## 2016-12-06 NOTE — Transfer of Care (Signed)
Immediate Anesthesia Transfer of Care Note  Patient: Diane AcostaSarah A Castillo  Procedure(s) Performed: Procedure(s): CLOSED MANIPULATION KNEE (Right)  Patient Location: PACU  Anesthesia Type:General  Level of Consciousness: awake, alert  and oriented  Airway & Oxygen Therapy: Patient Spontanous Breathing and Patient connected to nasal cannula oxygen  Post-op Assessment: Report given to RN and Post -op Vital signs reviewed and stable  Post vital signs: Reviewed and stable  Last Vitals:  Vitals:   12/06/16 1114  BP: (!) 138/100  Pulse: 67  Resp: 15  Temp: (!) 35.6 C    Last Pain:  Vitals:   12/06/16 1114  TempSrc: Tympanic  PainSc: 3       Patients Stated Pain Goal: 0 (12/06/16 1114)  Complications: No apparent anesthesia complications

## 2016-12-06 NOTE — Progress Notes (Signed)
Elevated right knee on pillow and placed ice to area.  Patient able to move extremities wnl, no acute distress.

## 2016-12-06 NOTE — Progress Notes (Signed)
No dressing to right knee

## 2016-12-06 NOTE — Progress Notes (Signed)
States pain is about the same and ready to go home

## 2016-12-06 NOTE — Anesthesia Preprocedure Evaluation (Signed)
Anesthesia Evaluation  Patient identified by MRN, date of birth, ID band Patient awake    Reviewed: Allergy & Precautions, H&P , NPO status , Patient's Chart, lab work & pertinent test results, reviewed documented beta blocker date and time   History of Anesthesia Complications Negative for: history of anesthetic complications  Airway Mallampati: III  TM Distance: >3 FB Neck ROM: full    Dental  (+) Partial Lower, Missing, Poor Dentition, Dental Advidsory Given   Pulmonary neg shortness of breath, asthma , neg sleep apnea, neg COPD, neg recent URI, former smoker,           Cardiovascular Exercise Tolerance: Good hypertension, (-) angina(-) CAD, (-) Past MI, (-) Cardiac Stents and (-) CABG (-) dysrhythmias + Valvular Problems/Murmurs      Neuro/Psych negative neurological ROS  negative psych ROS   GI/Hepatic Neg liver ROS, GERD  ,  Endo/Other  negative endocrine ROS  Renal/GU negative Renal ROS  negative genitourinary   Musculoskeletal   Abdominal   Peds  Hematology negative hematology ROS (+)   Anesthesia Other Findings Past Medical History: No date: Asthma No date: DVT (deep venous thrombosis) (HCC)     Comment: right leg No date: Hypertension No date: Immune deficiency disorder (HCC)   Reproductive/Obstetrics negative OB ROS                             Anesthesia Physical Anesthesia Plan  ASA: II  Anesthesia Plan: General   Post-op Pain Management:    Induction:   PONV Risk Score and Plan: 3 and Ondansetron, Dexamethasone and Midazolam  Airway Management Planned:   Additional Equipment:   Intra-op Plan:   Post-operative Plan:   Informed Consent: I have reviewed the patients History and Physical, chart, labs and discussed the procedure including the risks, benefits and alternatives for the proposed anesthesia with the patient or authorized representative who has  indicated his/her understanding and acceptance.   Dental Advisory Given  Plan Discussed with: Anesthesiologist, CRNA and Surgeon  Anesthesia Plan Comments:         Anesthesia Quick Evaluation

## 2016-12-06 NOTE — Progress Notes (Signed)
Ice pack to incision right knee

## 2016-12-06 NOTE — Anesthesia Postprocedure Evaluation (Signed)
Anesthesia Post Note  Patient: Diane AcostaSarah A Castillo  Procedure(s) Performed: Procedure(s) (LRB): CLOSED MANIPULATION KNEE (Right)  Patient location during evaluation: PACU Anesthesia Type: General Level of consciousness: awake and alert Pain management: pain level controlled Vital Signs Assessment: post-procedure vital signs reviewed and stable Respiratory status: spontaneous breathing, nonlabored ventilation, respiratory function stable and patient connected to nasal cannula oxygen Cardiovascular status: blood pressure returned to baseline and stable Postop Assessment: no signs of nausea or vomiting Anesthetic complications: no     Last Vitals:  Vitals:   12/06/16 1342 12/06/16 1418  BP: (!) 158/91 (!) 147/83  Pulse: (!) 55 (!) 58  Resp: 16   Temp: (!) 35.8 C     Last Pain:  Vitals:   12/06/16 1418  TempSrc:   PainSc: 3                  Lenard SimmerAndrew Ashten Prats

## 2016-12-25 ENCOUNTER — Other Ambulatory Visit: Payer: Self-pay | Admitting: Orthopedic Surgery

## 2016-12-25 DIAGNOSIS — M7989 Other specified soft tissue disorders: Secondary | ICD-10-CM

## 2016-12-26 ENCOUNTER — Ambulatory Visit
Admission: RE | Admit: 2016-12-26 | Discharge: 2016-12-26 | Disposition: A | Discharge status: Home / Self Care | Payer: Medicaid Other | Source: Ambulatory Visit | Attending: Orthopedic Surgery | Admitting: Orthopedic Surgery

## 2016-12-26 ENCOUNTER — Ambulatory Visit: Payer: Medicaid Other

## 2016-12-26 DIAGNOSIS — M7989 Other specified soft tissue disorders: Secondary | ICD-10-CM | POA: Diagnosis not present

## 2017-03-18 DIAGNOSIS — B182 Chronic viral hepatitis C: Secondary | ICD-10-CM | POA: Insufficient documentation

## 2017-03-31 ENCOUNTER — Encounter: Payer: Self-pay | Admitting: Emergency Medicine

## 2017-03-31 ENCOUNTER — Emergency Department
Admission: EM | Admit: 2017-03-31 | Discharge: 2017-03-31 | Disposition: A | Discharge status: Home / Self Care | Payer: Medicaid Other | Attending: Emergency Medicine | Admitting: Emergency Medicine

## 2017-03-31 DIAGNOSIS — I10 Essential (primary) hypertension: Secondary | ICD-10-CM | POA: Insufficient documentation

## 2017-03-31 DIAGNOSIS — Z87891 Personal history of nicotine dependence: Secondary | ICD-10-CM | POA: Insufficient documentation

## 2017-03-31 DIAGNOSIS — Z96651 Presence of right artificial knee joint: Secondary | ICD-10-CM | POA: Insufficient documentation

## 2017-03-31 DIAGNOSIS — J45909 Unspecified asthma, uncomplicated: Secondary | ICD-10-CM | POA: Insufficient documentation

## 2017-03-31 DIAGNOSIS — Z79899 Other long term (current) drug therapy: Secondary | ICD-10-CM | POA: Insufficient documentation

## 2017-03-31 DIAGNOSIS — M25561 Pain in right knee: Secondary | ICD-10-CM | POA: Insufficient documentation

## 2017-03-31 DIAGNOSIS — Z7982 Long term (current) use of aspirin: Secondary | ICD-10-CM | POA: Insufficient documentation

## 2017-03-31 DIAGNOSIS — G8929 Other chronic pain: Secondary | ICD-10-CM | POA: Diagnosis not present

## 2017-03-31 MED ORDER — KETOROLAC TROMETHAMINE 30 MG/ML IJ SOLN
30.0000 mg | Freq: Once | INTRAMUSCULAR | Status: AC
  Administered 2017-03-31: 30 mg via INTRAMUSCULAR
  Filled 2017-03-31: qty 1

## 2017-03-31 MED ORDER — OXYCODONE-ACETAMINOPHEN 5-325 MG PO TABS
1.0000 | ORAL_TABLET | Freq: Once | ORAL | Status: AC
  Administered 2017-03-31: 1 via ORAL
  Filled 2017-03-31: qty 1

## 2017-03-31 MED ORDER — KETOROLAC TROMETHAMINE 10 MG PO TABS: 10.0000 mg | ORAL_TABLET | Freq: Four times a day (QID) | ORAL | 0 refills | Status: DC | PRN

## 2017-03-31 NOTE — ED Triage Notes (Signed)
Pt has had problems with right knee for a long time. Replacement May this year.  Pt reports does not need xray had one last week, just here for pain control.  Screws keep coming out per pt.

## 2017-03-31 NOTE — ED Provider Notes (Signed)
[facility] Emergency Department Provider Note  ____________________________________________  Time seen: Approximately 10:14 AM  I have reviewed the triage vital signs and the nursing notes.   HISTORY  Chief Complaint Knee Pain    HPI Diane Castillo is a 46 y.o. female that presents to the emergency department for evaluation of right knee pain for many years. Patient states that she had a total knee replacement done in May and pain has worsened since then. Symptoms have not changed in character since May. Pain is worse with bending and straightening her knee. No redness or swelling.  No new injuries. She has been using a walker due to pain. She sees Dr. Rosita Kea, and he is aware of this increased pain. He prescribed her percocet, which she is out of.  She has an appointment with a new orthopedic doctor in [place] on October 31. She had x-rays done last week and does not want any more. She works at [employer]. No fever, swelling, numbness, tingling.   Past Medical History:  Diagnosis Date  . Asthma   . DVT (deep venous thrombosis) (HCC)    right leg  . Hypertension   . Immune deficiency disorder [facility])     Patient Active Problem List   Diagnosis Date Noted  . Primary localized osteoarthritis of right knee 10/30/2016  . Right leg pain 11/14/2015  . Gastrocnemius muscle strain 11/14/2015  . Hypokalemia 11/14/2015  . Metabolic acidosis 11/14/2015  . Hyperglycemia 11/14/2015  . Drug abuse (HCC) 11/14/2015  . Rhabdomyolysis 11/13/2015    Past Surgical History:  Procedure Laterality Date  . ABDOMINAL HYSTERECTOMY    . CHOLECYSTECTOMY    . HAND SURGERY    . HERNIA REPAIR  2014   ventral  . KNEE CLOSED REDUCTION Right 12/06/2016   Procedure: CLOSED MANIPULATION KNEE;  Surgeon: Kennedy Bucker, MD;  Location: [facility] ORS;  Service: Orthopedics;  Laterality: Right;  . KNEE SURGERY    . ORIF FINGER FRACTURE Left    middle finger, metal placed  . TUBAL LIGATION       Prior to Admission medications   Medication Sig Start Date End Date Taking? Authorizing Provider  acetaminophen (TYLENOL) 500 MG tablet Take 1,000 mg by mouth daily as needed for moderate pain or headache.    [provider]  albuterol (PROVENTIL HFA) 108 (90 Base) MCG/ACT inhaler Inhale 2 puffs into the lungs every 6 (six) hours as needed for wheezing or shortness of breath.  10/03/15   [provider]  aspirin EC 81 MG tablet Take 81 mg by mouth daily.     [provider]  Aspirin-Salicylamide-Caffeine (ARTHRITIS STRENGTH BC POWDER PO) Take 1 packet by mouth daily as needed (pain).    [provider]  brompheniramine-pseudoephedrine-DM 30-2-10 MG/5ML syrup Take 5 mLs by mouth 4 (four) times daily as needed. Patient not taking: Reported on 10/22/2016 06/20/16   Joni Reining, PA-C  diphenhydrAMINE (BENADRYL) 25 MG tablet Take 25 mg by mouth daily as needed for allergies.    [provider]  docusate sodium (COLACE) 100 MG capsule Take 1 capsule (100 mg total) by mouth 2 (two) times daily. Patient taking differently: Take 100 mg by mouth daily.  11/14/15   Katharina Caper, MD  dolutegravir (TIVICAY) 50 MG tablet Take 50 mg by mouth daily. 05/16/15   [provider]  emtricitabine-tenofovir AF (DESCOVY) 200-25 MG tablet Take 1 tablet by mouth daily.    [provider]  enalapril (VASOTEC) 10 MG  tablet Take 10 mg by mouth daily.    [provider]  enoxaparin (LOVENOX) 40 MG/0.4ML injection Inject 0.4 mLs (40 mg total) into the skin daily. Patient not taking: Reported on 12/06/2016 11/02/16   Dedra Skeens, PA-C  ketorolac (TORADOL) 10 MG tablet Take 1 tablet (10 mg total) by mouth every 6 (six) hours as needed. 03/31/17   Enid Derry, PA-C  Menthol, Topical Analgesic, (ICY HOT EX) Apply 1 application topically daily as needed (pain).    [provider]  oxyCODONE (ROXICODONE) 5 MG immediate release tablet Take 1 tablet  (5 mg total) by mouth every 4 (four) hours as needed for severe pain. 12/06/16   Kennedy Bucker, MD  oxyCODONE-acetaminophen (ROXICET) 5-325 MG tablet Take 1 tablet by mouth every 6 (six) hours as needed for moderate pain. 11/02/16   Dedra Skeens, PA-C  valACYclovir (VALTREX) 500 MG tablet Take 500 mg by mouth 2 (two) times daily. 10/03/15   [provider]    Allergies Penicillins and Tramadol  Family History  Problem Relation Age of Onset  . Heart disease Mother     Social History Social History  Substance Use Topics  . Smoking status: Former Smoker    Packs/day: 0.25    Types: Cigarettes    Quit date: 09/15/2016  . Smokeless tobacco: Never Used  . Alcohol use No     Review of Systems  Constitutional: No fever/chills Cardiovascular: No chest pain. Respiratory: No SOB. Gastrointestinal: No abdominal pain.  No nausea, no vomiting.  Musculoskeletal: Positive for knee pain. Skin: Negative for rash, abrasions, lacerations, ecchymosis. Neurological: Negative for headaches, numbness or tingling   ____________________________________________   PHYSICAL EXAM:  VITAL SIGNS: ED Triage Vitals  Enc Vitals Group     BP 03/31/17 1003 (!) 122/100     Pulse Rate 03/31/17 1003 87     Resp 03/31/17 1003 18     Temp 03/31/17 1003 98 F (36.7 C)     Temp Source 03/31/17 1003 Oral     SpO2 03/31/17 1003 96 %     Weight 03/31/17 0956 189 lb (85.7 kg)     Height 03/31/17 0956  (1.651 m)     Head Circumference --      Peak Flow --      Pain Score 03/31/17 0956 10     Pain Loc --      Pain Edu? --      Excl. in GC? --      Constitutional: Alert and oriented. Well appearing and in no acute distress. Eyes: Conjunctivae are normal. PERRL. EOMI. Head: Atraumatic. ENT:      Ears:      Nose: No congestion/rhinnorhea.      Mouth/Throat: Mucous membranes are moist.  Neck: No stridor.  Cardiovascular: Normal rate, regular rhythm.  Good peripheral circulation. Respiratory:  Normal respiratory effort without tachypnea or retractions. Lungs CTAB. Good air entry to the bases with no decreased or absent breath sounds. Musculoskeletal: Full range of motion to all extremities. No gross deformities appreciated. Surgical scar over right knee. No visible swelling or erythema. Full ROM of knee.  Neurologic:  Normal speech and language. No gross focal neurologic deficits are appreciated.  Skin:  Skin is warm, dry and intact. No rash noted.   ____________________________________________   LABS (all labs ordered are listed, but only abnormal results are displayed)  Labs Reviewed - No data to display ____________________________________________  EKG   ____________________________________________  RADIOLOGY  No results found.  ____________________________________________    PROCEDURES  Procedure(s) performed:    Procedures    Medications  oxyCODONE-acetaminophen (PERCOCET/ROXICET) 5-325 MG per tablet 1 tablet (1 tablet Oral Given 03/31/17 1115)  ketorolac (TORADOL) 30 MG/ML injection 30 mg (30 mg Intramuscular Given 03/31/17 1115)  oxyCODONE-acetaminophen (PERCOCET/ROXICET) 5-325 MG per tablet 1 tablet (1 tablet Oral Given 03/31/17 1208)     ____________________________________________   INITIAL IMPRESSION / ASSESSMENT AND PLAN / ED COURSE  Pertinent labs & imaging results that were available during my care of the patient were reviewed by me and considered in my medical decision making (see chart for details).  Review of the [place] CSRS was performed in accordance of the [facility] prior to dispensing any controlled drugs.   Patient presented to emergency department for evaluation of knee pain. Patient states that she is out of her pain medication, which was controlling the pain. She had x-rays done last week and would not like any more. She had a knee replacement done in May and has been dealing with this same pain since then. No new injuries. She is going  to call Dr. Truett Perna tomorrow and has an appointment with another orthopedic doctor at the end of the month at [facility]. She was given a dose of Percocet and IM Toradol in the ED. Patient will be discharged home with prescriptions for oral toradol. Patient is to follow up with orthopedics as directed. Patient is given ED precautions to return to the ED for any worsening or new symptoms.     ____________________________________________  FINAL CLINICAL IMPRESSION(S) / ED DIAGNOSES  Final diagnoses:  Chronic pain of right knee  History of total right knee replacement      NEW MEDICATIONS STARTED DURING THIS VISIT:  Discharge Medication List as of 03/31/2017 11:42 AM    START taking these medications   Details  ketorolac (TORADOL) 10 MG tablet Take 1 tablet (10 mg total) by mouth every 6 (six) hours as needed., Starting Sun 03/31/2017, Print            This chart was dictated using voice recognition software/Dragon. Despite best efforts to proofread, errors can occur which can change the meaning. Any change was purely unintentional.    Enid Derry, PA-C 03/31/17 1807    Minna Antis, MD 04/02/17 2250

## 2017-04-12 ENCOUNTER — Emergency Department
Admission: EM | Admit: 2017-04-12 | Discharge: 2017-04-12 | Disposition: A | Discharge status: Home / Self Care | Payer: Medicaid Other | Attending: Emergency Medicine | Admitting: Emergency Medicine

## 2017-04-12 ENCOUNTER — Encounter: Payer: Self-pay | Admitting: Emergency Medicine

## 2017-04-12 DIAGNOSIS — J45909 Unspecified asthma, uncomplicated: Secondary | ICD-10-CM | POA: Diagnosis not present

## 2017-04-12 DIAGNOSIS — Z79899 Other long term (current) drug therapy: Secondary | ICD-10-CM | POA: Insufficient documentation

## 2017-04-12 DIAGNOSIS — G8929 Other chronic pain: Secondary | ICD-10-CM | POA: Insufficient documentation

## 2017-04-12 DIAGNOSIS — I10 Essential (primary) hypertension: Secondary | ICD-10-CM | POA: Insufficient documentation

## 2017-04-12 DIAGNOSIS — Z87891 Personal history of nicotine dependence: Secondary | ICD-10-CM | POA: Diagnosis not present

## 2017-04-12 DIAGNOSIS — M25561 Pain in right knee: Secondary | ICD-10-CM | POA: Diagnosis not present

## 2017-04-12 DIAGNOSIS — Z7982 Long term (current) use of aspirin: Secondary | ICD-10-CM | POA: Diagnosis not present

## 2017-04-12 DIAGNOSIS — Z7901 Long term (current) use of anticoagulants: Secondary | ICD-10-CM | POA: Insufficient documentation

## 2017-04-12 MED ORDER — OXYCODONE-ACETAMINOPHEN 7.5-325 MG PO TABS: 1.0000 | ORAL_TABLET | Freq: Four times a day (QID) | ORAL | 0 refills | Status: DC | PRN

## 2017-04-12 MED ORDER — OXYCODONE-ACETAMINOPHEN 5-325 MG PO TABS
1.0000 | ORAL_TABLET | Freq: Once | ORAL | Status: AC
  Administered 2017-04-12: 1 via ORAL
  Filled 2017-04-12: qty 1

## 2017-04-12 MED ORDER — KETOROLAC TROMETHAMINE 30 MG/ML IJ SOLN
30.0000 mg | Freq: Once | INTRAMUSCULAR | Status: AC
  Administered 2017-04-12: 30 mg via INTRAMUSCULAR
  Filled 2017-04-12: qty 1

## 2017-04-12 NOTE — ED Triage Notes (Signed)
Pt to ED via POV, pt ambulatory with difficulty, pt declined wheelchair. Pt states that she has had chronic knee pain, had surgery in May and was told by MD who did the surgery that he put the wrong screws in, pt states that she has been in pain since this time. Pt states that she was referred to the pain clinic but they told her that there was nothing that they could do for the pain because she needs to have the surgery corrected. Pt in NAD at this time.

## 2017-04-12 NOTE — ED Provider Notes (Signed)
[facility] Emergency Department Provider Note  ____________________________________________  Time seen: Approximately 1:45 PM  I have reviewed the triage vital signs and the nursing notes.   HISTORY  Chief Complaint Knee Pain    HPI Diane Castillo is a 46 y.o. female that presents to the emergency department for evaluation of chronic right knee pain. Patient had a knee replacement in May and has had pain since. She sees Dr. Truett Perna. She states that the wrong screws are in her knee and she needs another surgery to fix the screws. Pain worsened after surgery and has been chronic since May. No recent injury. She was taken off of pain medication 1 month ago and has had difficulty walking and pain with any movement of the since then. Patient was seen at [facility] 5 days ago for workup of septic joint. She saw pain management last week who told her that they could not do anything that she needs to have surgery. She has an appointment with Dr. Caryl Pina at [facility] on Wednesday for a second opinion. She denies fever, chills, numbness, tingling.   Past Medical History:  Diagnosis Date  . Asthma   . DVT (deep venous thrombosis) (HCC)    right leg  . Hypertension   . Immune deficiency disorder [facility])     Patient Active Problem List   Diagnosis Date Noted  . Primary localized osteoarthritis of right knee 10/30/2016  . Right leg pain 11/14/2015  . Gastrocnemius muscle strain 11/14/2015  . Hypokalemia 11/14/2015  . Metabolic acidosis 11/14/2015  . Hyperglycemia 11/14/2015  . Drug abuse (HCC) 11/14/2015  . Rhabdomyolysis 11/13/2015    Past Surgical History:  Procedure Laterality Date  . ABDOMINAL HYSTERECTOMY    . CHOLECYSTECTOMY    . HAND SURGERY    . HERNIA REPAIR  2014   ventral  . KNEE CLOSED REDUCTION Right 12/06/2016   Procedure: CLOSED MANIPULATION KNEE;  Surgeon: Kennedy Bucker, MD;  Location: [facility] ORS;  Service: Orthopedics;  Laterality: Right;  . KNEE SURGERY    .  ORIF FINGER FRACTURE Left    middle finger, metal placed  . TUBAL LIGATION      Prior to Admission medications   Medication Sig Start Date End Date Taking? Authorizing Provider  acetaminophen (TYLENOL) 500 MG tablet Take 1,000 mg by mouth daily as needed for moderate pain or headache.    [provider]  albuterol (PROVENTIL HFA) 108 (90 Base) MCG/ACT inhaler Inhale 2 puffs into the lungs every 6 (six) hours as needed for wheezing or shortness of breath.  10/03/15   [provider]  aspirin EC 81 MG tablet Take 81 mg by mouth daily.     [provider]  Aspirin-Salicylamide-Caffeine (ARTHRITIS STRENGTH BC POWDER PO) Take 1 packet by mouth daily as needed (pain).    [provider]  brompheniramine-pseudoephedrine-DM 30-2-10 MG/5ML syrup Take 5 mLs by mouth 4 (four) times daily as needed. Patient not taking: Reported on 10/22/2016 06/20/16   Joni Reining, PA-C  diphenhydrAMINE (BENADRYL) 25 MG tablet Take 25 mg by mouth daily as needed for allergies.    [provider]  docusate sodium (COLACE) 100 MG capsule Take 1 capsule (100 mg total) by mouth 2 (two) times daily. Patient taking differently: Take 100 mg by mouth daily.  11/14/15   Katharina Caper, MD  dolutegravir (TIVICAY) 50 MG tablet Take 50 mg by mouth daily. 05/16/15   [provider]  emtricitabine-tenofovir AF (DESCOVY) 200-25 MG tablet Take 1  tablet by mouth daily.    [provider]  enalapril (VASOTEC) 10 MG tablet Take 10 mg by mouth daily.    [provider]  enoxaparin (LOVENOX) 40 MG/0.4ML injection Inject 0.4 mLs (40 mg total) into the skin daily. Patient not taking: Reported on 12/06/2016 11/02/16   Dedra SkeensMundy, Todd, PA-C  ketorolac (TORADOL) 10 MG tablet Take 1 tablet (10 mg total) by mouth every 6 (six) hours as needed. 03/31/17   Enid DerryWagner, Carri Spillers, PA-C  Menthol, Topical Analgesic, (ICY HOT EX) Apply 1 application topically daily as needed (pain).    [provider]  oxyCODONE (ROXICODONE) 5 MG immediate release tablet Take 1 tablet (5 mg total) by mouth every 4 (four) hours as needed for severe pain. 12/06/16   Kennedy BuckerMenz, Michael, MD  oxyCODONE-acetaminophen (PERCOCET) 7.5-325 MG tablet Take 1 tablet by mouth every 6 (six) hours as needed for severe pain. 04/12/17 04/12/18  Enid DerryWagner, Abaigeal Moomaw, PA-C  valACYclovir (VALTREX) 500 MG tablet Take 500 mg by mouth 2 (two) times daily. 10/03/15   [provider]    Allergies Penicillins and Tramadol  Family History  Problem Relation Age of Onset  . Heart disease Mother     Social History Social History  Substance Use Topics  . Smoking status: Former Smoker    Packs/day: 0.25    Types: Cigarettes    Quit date: 09/15/2016  . Smokeless tobacco: Never Used  . Alcohol use No     Review of Systems  Constitutional: No fever/chills Cardiovascular: No chest pain. Respiratory:  No SOB. Gastrointestinal: No abdominal pain.  No nausea, no vomiting.  Musculoskeletal: Positive for knee pain. Skin: Negative for rash, abrasions, lacerations, ecchymosis. Neurological: Negative for headaches, numbness or tingling   ____________________________________________   PHYSICAL EXAM:  VITAL SIGNS: ED Triage Vitals  Enc Vitals Group     BP 04/12/17 1146 119/83     Pulse Rate 04/12/17 1146 80     Resp 04/12/17 1146 16     Temp 04/12/17 1146 98.7 F (37.1 C)     Temp Source 04/12/17 1146 Oral     SpO2 04/12/17 1146 100 %     Weight 04/12/17 1146 189 lb (85.7 kg)     Height 04/12/17 1146 5\' 5"  (1.651 m)     Head Circumference --      Peak Flow --      Pain Score 04/12/17 1144 10     Pain Loc --      Pain Edu? --      Excl. in GC? --      Constitutional: Alert and oriented. Well appearing and in no acute distress. Eyes: Conjunctivae are normal. PERRL. EOMI. Head: Atraumatic. ENT:      Ears:      Nose: No congestion/rhinnorhea.      Mouth/Throat: Mucous membranes are moist.  Neck: No  stridor.  Cardiovascular: Normal rate, regular rhythm.  Good peripheral circulation. Respiratory: Normal respiratory effort without tachypnea or retractions. Lungs CTAB. Good air entry to the bases with no decreased or absent breath sounds. Musculoskeletal: Full range of motion to all extremities. No gross deformities appreciated. No visible swelling to right knee. Tenderness to palpation over entire anterior knee. Pain with flexion of the knee. Well-healed surgical scars. No warmth or erythema. Neurologic:  Normal speech and language. No gross focal neurologic deficits are appreciated.  Skin:  Skin is warm, dry and intact. No rash noted.   ____________________________________________   LABS (all labs ordered are listed, but  only abnormal results are displayed)  Labs Reviewed - No data to display ____________________________________________  EKG   ____________________________________________  RADIOLOGY   No results found.  ____________________________________________    PROCEDURES  Procedure(s) performed:    Procedures    Medications  oxyCODONE-acetaminophen (PERCOCET/ROXICET) 5-325 MG per tablet 1 tablet (1 tablet Oral Given 04/12/17 1321)  ketorolac (TORADOL) 30 MG/ML injection 30 mg (30 mg Intramuscular Given 04/12/17 1321)  oxyCODONE-acetaminophen (PERCOCET/ROXICET) 5-325 MG per tablet 1 tablet (1 tablet Oral Given 04/12/17 1406)     ____________________________________________   INITIAL IMPRESSION / ASSESSMENT AND PLAN / ED COURSE  Pertinent labs & imaging results that were available during my care of the patient were reviewed by me and considered in my medical decision making (see chart for details).  Review of the [place] CSRS was performed in accordance of the [facility] prior to dispensing any controlled drugs.     Patient presented to the emergency department for chronic knee pain. Vital signs and exam are reassuring. Patient has been evaluated several times  in the last month by different emergency rooms. No new injury. No concern for septic joint. She has an appointment with [facility] orthopedics on Wednesday and will be given a very short prescription for Percocet to get her to her appointment. She was made aware I will not be able to write her any more prescriptions for narcotics. Patient will be discharged home with prescriptions for  Percocet. Patient is to follow up with orthopedics as directed. Patient is given ED precautions to return to the ED for any worsening or new symptoms.     ____________________________________________  FINAL CLINICAL IMPRESSION(S) / ED DIAGNOSES  Final diagnoses:  Chronic pain of right knee      NEW MEDICATIONS STARTED DURING THIS VISIT:  Discharge Medication List as of 04/12/2017  2:27 PM    START taking these medications   Details  oxyCODONE-acetaminophen (PERCOCET) 7.5-325 MG tablet Take 1 tablet by mouth every 6 (six) hours as needed for severe pain., Starting Fri 04/12/2017, Until Sat 04/12/2018, Print            This chart was dictated using voice recognition software/Dragon. Despite best efforts to proofread, errors can occur which can change the meaning. Any change was purely unintentional.    Enid Derry, PA-C 04/12/17 1653    Merrily Brittle, MD 04/13/17 [phone]

## 2017-04-14 ENCOUNTER — Encounter: Payer: Self-pay | Admitting: Emergency Medicine

## 2017-04-14 ENCOUNTER — Emergency Department: Payer: Medicaid Other

## 2017-04-14 ENCOUNTER — Emergency Department
Admission: EM | Admit: 2017-04-14 | Discharge: 2017-04-14 | Disposition: A | Discharge status: Home / Self Care | Payer: Medicaid Other | Attending: Emergency Medicine | Admitting: Emergency Medicine

## 2017-04-14 DIAGNOSIS — M25561 Pain in right knee: Secondary | ICD-10-CM | POA: Diagnosis present

## 2017-04-14 DIAGNOSIS — Z7982 Long term (current) use of aspirin: Secondary | ICD-10-CM | POA: Diagnosis not present

## 2017-04-14 DIAGNOSIS — I10 Essential (primary) hypertension: Secondary | ICD-10-CM | POA: Insufficient documentation

## 2017-04-14 DIAGNOSIS — M79661 Pain in right lower leg: Secondary | ICD-10-CM | POA: Diagnosis not present

## 2017-04-14 DIAGNOSIS — G8929 Other chronic pain: Secondary | ICD-10-CM | POA: Diagnosis not present

## 2017-04-14 DIAGNOSIS — Z79899 Other long term (current) drug therapy: Secondary | ICD-10-CM | POA: Diagnosis not present

## 2017-04-14 DIAGNOSIS — J45909 Unspecified asthma, uncomplicated: Secondary | ICD-10-CM | POA: Insufficient documentation

## 2017-04-14 DIAGNOSIS — Z87891 Personal history of nicotine dependence: Secondary | ICD-10-CM | POA: Diagnosis not present

## 2017-04-14 MED ORDER — KETOROLAC TROMETHAMINE 60 MG/2ML IM SOLN
60.0000 mg | Freq: Once | INTRAMUSCULAR | Status: AC
  Administered 2017-04-14: 60 mg via INTRAMUSCULAR
  Filled 2017-04-14: qty 2

## 2017-04-14 NOTE — ED Provider Notes (Signed)
[facility] Emergency Department Provider Note   ____________________________________________   I have reviewed the triage vital signs and the nursing notes.   HISTORY  Chief Complaint Knee Pain   History limited by: Not Limited   HPI Diane Castillo is a 46 y.o. female who presents to the emergency department today for continued knee pain.   LOCATION:right knee DURATION:months TIMING: constant SEVERITY: severe QUALITY: burning CONTEXT: patient had right knee surgery done in July. States pain has been bad since then.  MODIFYING FACTORS: worse with movement ASSOCIATED SYMPTOMS: denies fevers. Has had some calf pain.  Per medical record review patient has a history of multiple emergency department visits recently for knee pain. History of DVT.   Past Medical History:  Diagnosis Date  . Asthma   . DVT (deep venous thrombosis) (HCC)    right leg  . Hypertension   . Immune deficiency disorder [facility](HCC)     Patient Active Problem List   Diagnosis Date Noted  . Primary localized osteoarthritis of right knee 10/30/2016  . Right leg pain 11/14/2015  . Gastrocnemius muscle strain 11/14/2015  . Hypokalemia 11/14/2015  . Metabolic acidosis 11/14/2015  . Hyperglycemia 11/14/2015  . Drug abuse (HCC) 11/14/2015  . Rhabdomyolysis 11/13/2015    Past Surgical History:  Procedure Laterality Date  . ABDOMINAL HYSTERECTOMY    . CHOLECYSTECTOMY    . HAND SURGERY    . HERNIA REPAIR  2014   ventral  . KNEE CLOSED REDUCTION Right 12/06/2016   Procedure: CLOSED MANIPULATION KNEE;  Surgeon: Kennedy BuckerMenz, Michael, MD;  Location: [facility] ORS;  Service: Orthopedics;  Laterality: Right;  . KNEE SURGERY    . ORIF FINGER FRACTURE Left    middle finger, metal placed  . TUBAL LIGATION      Prior to Admission medications   Medication Sig Start Date End Date Taking? Authorizing Provider  acetaminophen (TYLENOL) 500 MG tablet Take 1,000 mg by mouth daily as needed for moderate  pain or headache.    [provider]  albuterol (PROVENTIL HFA) 108 (90 Base) MCG/ACT inhaler Inhale 2 puffs into the lungs every 6 (six) hours as needed for wheezing or shortness of breath.  10/03/15   [provider]  aspirin EC 81 MG tablet Take 81 mg by mouth daily.     [provider]  Aspirin-Salicylamide-Caffeine (ARTHRITIS STRENGTH BC POWDER PO) Take 1 packet by mouth daily as needed (pain).    [provider]  brompheniramine-pseudoephedrine-DM 30-2-10 MG/5ML syrup Take 5 mLs by mouth 4 (four) times daily as needed. Patient not taking: Reported on 10/22/2016 06/20/16   Joni ReiningSmith, Ronald K, PA-C  diphenhydrAMINE (BENADRYL) 25 MG tablet Take 25 mg by mouth daily as needed for allergies.    [provider]  docusate sodium (COLACE) 100 MG capsule Take 1 capsule (100 mg total) by mouth 2 (two) times daily. Patient taking differently: Take 100 mg by mouth daily.  11/14/15   Katharina CaperVaickute, Rima, MD  dolutegravir (TIVICAY) 50 MG tablet Take 50 mg by mouth daily. 05/16/15   [provider]  emtricitabine-tenofovir AF (DESCOVY) 200-25 MG tablet Take 1 tablet by mouth daily.    [provider]  enalapril (VASOTEC) 10 MG tablet Take 10 mg by mouth daily.    [provider]  enoxaparin (LOVENOX) 40 MG/0.4ML injection Inject 0.4 mLs (40 mg total) into the skin daily. Patient not taking: Reported on 12/06/2016 11/02/16   Dedra SkeensMundy, Todd, PA-C  ketorolac (TORADOL) 10 MG tablet Take 1  tablet (10 mg total) by mouth every 6 (six) hours as needed. 03/31/17   Enid Derry, PA-C  Menthol, Topical Analgesic, (ICY HOT EX) Apply 1 application topically daily as needed (pain).    [provider]  oxyCODONE (ROXICODONE) 5 MG immediate release tablet Take 1 tablet (5 mg total) by mouth every 4 (four) hours as needed for severe pain. 12/06/16   Kennedy Bucker, MD  oxyCODONE-acetaminophen (PERCOCET) 7.5-325 MG tablet Take 1 tablet by mouth every 6 (six) hours  as needed for severe pain. 04/12/17 04/12/18  Enid Derry, PA-C  valACYclovir (VALTREX) 500 MG tablet Take 500 mg by mouth 2 (two) times daily. 10/03/15   [provider]    Allergies Penicillins and Tramadol  Family History  Problem Relation Age of Onset  . Heart disease Mother     Social History Social History  Substance Use Topics  . Smoking status: Former Smoker    Packs/day: 0.25    Types: Cigarettes    Quit date: 09/15/2016  . Smokeless tobacco: Never Used  . Alcohol use No    Review of Systems Constitutional: No fever/chills Eyes: No visual changes. ENT: No sore throat. Cardiovascular: Denies chest pain. Respiratory: Denies shortness of breath. Gastrointestinal: No abdominal pain.  No nausea, no vomiting.  No diarrhea.   Genitourinary: Negative for dysuria. Musculoskeletal: Positive for right knee/leg pain. Skin: Negative for rash. Neurological: Negative for headaches, focal weakness or numbness.  ____________________________________________   PHYSICAL EXAM:  VITAL SIGNS: ED Triage Vitals  Enc Vitals Group     BP 04/14/17 1554 (!) 170/85     Pulse Rate 04/14/17 1554 (!) 58     Resp 04/14/17 1554 18     Temp 04/14/17 1554 98.5 F (36.9 C)     Temp Source 04/14/17 1554 Oral     SpO2 04/14/17 1554 99 %     Weight 04/14/17 1536 189 lb (85.7 kg)     Height 04/14/17 1536 5\' 5"  (1.651 m)     Head Circumference --      Peak Flow --      Pain Score 04/14/17 1536 9   Constitutional: Alert and oriented. Well appearing and in no distress. Eyes: Conjunctivae are normal.  ENT   Head: Normocephalic and atraumatic.   Nose: No congestion/rhinnorhea.   Mouth/Throat: Mucous membranes are moist.   Neck: No stridor. Hematological/Lymphatic/Immunilogical: No cervical lymphadenopathy. Cardiovascular: Normal rate, regular rhythm.  No murmurs, rubs, or gallops.  Respiratory: Normal respiratory effort without tachypnea nor retractions. Breath sounds  are clear and equal bilaterally. No wheezes/rales/rhonchi. Gastrointestinal: Soft and non tender. No rebound. No guarding.  Genitourinary: Deferred Musculoskeletal: Tenderness with manipulation of right knee. No erythema. Some tenderness to palpation of the right calf. Neurologic:  Normal speech and language. No gross focal neurologic deficits are appreciated.  Skin:  Skin is warm, dry and intact. No rash noted. Psychiatric: Mood and affect are normal. Speech and behavior are normal. Patient exhibits appropriate insight and judgment.  ____________________________________________    LABS (pertinent positives/negatives)  None  ____________________________________________   EKG  None  ____________________________________________    RADIOLOGY  Right Lower extremity US No DVT   ____________________________________________   PROCEDURES  Procedures  ____________________________________________   INITIAL IMPRESSION / ASSESSMENT AND PLAN / ED COURSE  Pertinent labs & imaging results that were available during my care of the patient were reviewed by me and considered in my medical decision making (see chart for details).  Patient presents to the emergency department today  because of concerns for continued right knee pain.  This has been a somewhat chronic issue for the patient now.  She has had multiple emergency department visits both at our facility and outside facilities.  It does appear that much of her concern is for pain control.  I did order an ultrasound given history of DVT and calf pain however this was negative for DVTs.  At this point I do not feel comfortable prescribing further narcotics given the fact she has had narcotics prescribed by multiple different providers according to the [facility] drug database.  She has had 18 different providers to prescribe narcotics over the past 2 years.  In discussion with the patient she states she is Artie taking gabapentin  as well as Voltaren cream.  Discussed and encouraged patient to follow-up with orthopedic surgery.   ____________________________________________   FINAL CLINICAL IMPRESSION(S) / ED DIAGNOSES  Final diagnoses:  Chronic pain of right knee     Note: This dictation was prepared with Dragon dictation. Any transcriptional errors that result from this process are unintentional     Phineas Semen, MD 04/14/17 2021

## 2017-04-14 NOTE — ED Triage Notes (Signed)
Pt to ED via POV, pt ambulatory with minimal difficulty, pt declined wheelchair but requested walker. Pt states that she has had  knee pain, had surgery in May. When asked if she followed up with her surgeon, patient became rude and states "why would I see him again if he messed up the first time" Pt in no acute distress. Seen here 2 days ago

## 2017-04-14 NOTE — Discharge Instructions (Signed)
Please seek medical attention for any high fevers, chest pain, shortness of breath, change in behavior, persistent vomiting, bloody stool or any other new or concerning symptoms.  

## 2017-04-14 NOTE — ED Notes (Addendum)
Dr in with pt for eval. Pt c/o right calf pain with hx of dvt unknown time ago (2003). Ultrasound ordered.

## 2017-05-10 ENCOUNTER — Emergency Department: Payer: Medicaid Other

## 2017-05-10 ENCOUNTER — Encounter: Payer: Self-pay | Admitting: Emergency Medicine

## 2017-05-10 ENCOUNTER — Other Ambulatory Visit: Payer: Self-pay

## 2017-05-10 ENCOUNTER — Emergency Department
Admission: EM | Admit: 2017-05-10 | Discharge: 2017-05-10 | Disposition: A | Discharge status: Home / Self Care | Payer: Medicaid Other | Attending: Emergency Medicine | Admitting: Emergency Medicine

## 2017-05-10 DIAGNOSIS — Z7982 Long term (current) use of aspirin: Secondary | ICD-10-CM | POA: Diagnosis not present

## 2017-05-10 DIAGNOSIS — I10 Essential (primary) hypertension: Secondary | ICD-10-CM | POA: Insufficient documentation

## 2017-05-10 DIAGNOSIS — Z79899 Other long term (current) drug therapy: Secondary | ICD-10-CM | POA: Diagnosis not present

## 2017-05-10 DIAGNOSIS — Z96651 Presence of right artificial knee joint: Secondary | ICD-10-CM | POA: Diagnosis not present

## 2017-05-10 DIAGNOSIS — M25561 Pain in right knee: Secondary | ICD-10-CM | POA: Diagnosis not present

## 2017-05-10 DIAGNOSIS — Z87891 Personal history of nicotine dependence: Secondary | ICD-10-CM | POA: Diagnosis not present

## 2017-05-10 DIAGNOSIS — G8929 Other chronic pain: Secondary | ICD-10-CM | POA: Insufficient documentation

## 2017-05-10 DIAGNOSIS — J45909 Unspecified asthma, uncomplicated: Secondary | ICD-10-CM | POA: Insufficient documentation

## 2017-05-10 MED ORDER — KETOROLAC TROMETHAMINE 10 MG PO TABS: 10.0000 mg | ORAL_TABLET | Freq: Four times a day (QID) | ORAL | 0 refills | Status: AC | PRN

## 2017-05-10 MED ORDER — KETOROLAC TROMETHAMINE 30 MG/ML IJ SOLN
30.0000 mg | Freq: Once | INTRAMUSCULAR | Status: AC
  Administered 2017-05-10: 30 mg via INTRAMUSCULAR
  Filled 2017-05-10: qty 1

## 2017-05-10 NOTE — ED Triage Notes (Signed)
Pt reports right knee pain. Pt reports had right knee replacement in May 2018. Pt reports has had trouble ever since. Pt reports this morning knee popped out of place and she had to pop it back in. Pt ambulatory to triage.
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
## 2017-06-01 ENCOUNTER — Emergency Department
Admission: EM | Admit: 2017-06-01 | Discharge: 2017-06-01 | Disposition: A | Discharge status: Home / Self Care | Payer: Medicaid Other | Attending: Student in an Organized Health Care Education/Training Program | Admitting: Student in an Organized Health Care Education/Training Program

## 2017-06-01 ENCOUNTER — Encounter: Payer: Self-pay | Admitting: Emergency Medicine

## 2017-06-01 ENCOUNTER — Emergency Department: Payer: Medicaid Other

## 2017-06-01 DIAGNOSIS — I10 Essential (primary) hypertension: Secondary | ICD-10-CM | POA: Insufficient documentation

## 2017-06-01 DIAGNOSIS — M1711 Unilateral primary osteoarthritis, right knee: Secondary | ICD-10-CM | POA: Diagnosis not present

## 2017-06-01 DIAGNOSIS — Z79899 Other long term (current) drug therapy: Secondary | ICD-10-CM | POA: Insufficient documentation

## 2017-06-01 DIAGNOSIS — Z87891 Personal history of nicotine dependence: Secondary | ICD-10-CM | POA: Diagnosis not present

## 2017-06-01 DIAGNOSIS — M25561 Pain in right knee: Secondary | ICD-10-CM | POA: Diagnosis not present

## 2017-06-01 DIAGNOSIS — J45909 Unspecified asthma, uncomplicated: Secondary | ICD-10-CM | POA: Insufficient documentation

## 2017-06-01 DIAGNOSIS — Z7982 Long term (current) use of aspirin: Secondary | ICD-10-CM | POA: Insufficient documentation

## 2017-06-01 DIAGNOSIS — G8929 Other chronic pain: Secondary | ICD-10-CM | POA: Insufficient documentation

## 2017-06-01 MED ORDER — HYDROCODONE-ACETAMINOPHEN 5-325 MG PO TABS: 1.0000 | ORAL_TABLET | Freq: Three times a day (TID) | ORAL | 0 refills | Status: DC | PRN

## 2017-06-01 MED ORDER — KETOROLAC TROMETHAMINE 30 MG/ML IJ SOLN
30.0000 mg | Freq: Once | INTRAMUSCULAR | Status: AC
  Administered 2017-06-01: 30 mg via INTRAMUSCULAR
  Filled 2017-06-01: qty 1

## 2017-06-01 MED ORDER — HYDROCODONE-ACETAMINOPHEN 5-325 MG PO TABS
1.0000 | ORAL_TABLET | Freq: Once | ORAL | Status: AC
  Administered 2017-06-01: 1 via ORAL
  Filled 2017-06-01: qty 1

## 2017-06-01 MED ORDER — KETOROLAC TROMETHAMINE 10 MG PO TABS: 10.0000 mg | ORAL_TABLET | Freq: Three times a day (TID) | ORAL | 0 refills | Status: DC | PRN

## 2017-06-01 NOTE — ED Provider Notes (Signed)
[facility] Emergency Department Provider Note   ____________________________________________   First MD Initiated Contact with Patient 06/01/17 1233     (approximate)  I have reviewed the triage vital signs and the nursing notes.   HISTORY  Chief Complaint Knee Pain   HPI Diane Castillo is a 46 y.o. female is here complaining of right knee pain. Patient states that she suffers from chronic knee pain after having a knee replacement. She states that she slipped earlier this week on some ice and pain has increased since that time. Sherelates that she fell directly on her knee. She is continued to ambulate since that time with a limp. Patient states that her knee replacement was in May of this year. She continues to have severe pain and is unable to continue getting pain medication from the orthopedic surgeon. She states that she is also tried being seen at the pain clinic at which time she was told "her pain is so severe they can't do anything for her". She states that she took her last Toradol 10 mg yesterday and is completely out. She rates her pain as a 10 over 10.   Past Medical History:  Diagnosis Date  . Asthma   . DVT (deep venous thrombosis) (HCC)    right leg  . Hypertension   . Immune deficiency disorder [facility](HCC)     Patient Active Problem List   Diagnosis Date Noted  . Primary localized osteoarthritis of right knee 10/30/2016  . Right leg pain 11/14/2015  . Gastrocnemius muscle strain 11/14/2015  . Hypokalemia 11/14/2015  . Metabolic acidosis 11/14/2015  . Hyperglycemia 11/14/2015  . Drug abuse (HCC) 11/14/2015  . Rhabdomyolysis 11/13/2015    Past Surgical History:  Procedure Laterality Date  . ABDOMINAL HYSTERECTOMY    . CHOLECYSTECTOMY    . HAND SURGERY    . HERNIA REPAIR  2014   ventral  . KNEE CLOSED REDUCTION Right 12/06/2016   Procedure: CLOSED MANIPULATION KNEE;  Surgeon: Kennedy BuckerMenz, Michael, MD;  Location: [facility] ORS;  Service:  Orthopedics;  Laterality: Right;  . KNEE SURGERY    . ORIF FINGER FRACTURE Left    middle finger, metal placed  . TUBAL LIGATION      Prior to Admission medications   Medication Sig Start Date End Date Taking? Authorizing Provider  acetaminophen (TYLENOL) 500 MG tablet Take 1,000 mg by mouth daily as needed for moderate pain or headache.    [provider]  albuterol (PROVENTIL HFA) 108 (90 Base) MCG/ACT inhaler Inhale 2 puffs into the lungs every 6 (six) hours as needed for wheezing or shortness of breath.  10/03/15   [provider]  aspirin EC 81 MG tablet Take 81 mg by mouth daily.     [provider]  Aspirin-Salicylamide-Caffeine (ARTHRITIS STRENGTH BC POWDER PO) Take 1 packet by mouth daily as needed (pain).    [provider]  diphenhydrAMINE (BENADRYL) 25 MG tablet Take 25 mg by mouth daily as needed for allergies.    [provider]  docusate sodium (COLACE) 100 MG capsule Take 1 capsule (100 mg total) by mouth 2 (two) times daily. Patient taking differently: Take 100 mg by mouth daily.  11/14/15   Katharina CaperVaickute, Rima, MD  dolutegravir (TIVICAY) 50 MG tablet Take 50 mg by mouth daily. 05/16/15   [provider]  emtricitabine-tenofovir AF (DESCOVY) 200-25 MG tablet Take 1 tablet by mouth daily.    [provider]  enalapril (VASOTEC) 10 MG tablet Take  10 mg by mouth daily.    [provider]  HYDROcodone-acetaminophen (NORCO/VICODIN) 5-325 MG tablet Take 1 tablet by mouth every 8 (eight) hours as needed for moderate pain. 06/01/17   Tommi RumpsSummers, Kyley Laurel L, PA-C  ketorolac (TORADOL) 10 MG tablet Take 1 tablet (10 mg total) by mouth every 8 (eight) hours as needed for moderate pain. 06/01/17   Tommi RumpsSummers, Jackey Housey L, PA-C  Menthol, Topical Analgesic, (ICY HOT EX) Apply 1 application topically daily as needed (pain).    [provider]  valACYclovir (VALTREX) 500 MG tablet Take 500 mg by mouth 2 (two) times daily. 10/03/15    [provider]    Allergies Penicillins and Tramadol  Family History  Problem Relation Age of Onset  . Heart disease Mother     Social History Social History   Tobacco Use  . Smoking status: Former Smoker    Packs/day: 0.25    Types: Cigarettes    Last attempt to quit: 09/15/2016    Years since quitting: 0.7  . Smokeless tobacco: Never Used  Substance Use Topics  . Alcohol use: No  . Drug use: Yes    Types: Marijuana    Comment: daily mj - hx of cocaine use 2 years ago    Review of Systems Constitutional: No fever/chills Cardiovascular: Denies chest pain. Respiratory: Denies shortness of breath. Musculoskeletal: positive for acute and chronic right knee pain. Skin: Negative for injury. Neurological: Negative for headaches, focal weakness or numbness. ____________________________________________   PHYSICAL EXAM:  VITAL SIGNS: ED Triage Vitals  Enc Vitals Group     BP 06/01/17 1208 129/81     Pulse Rate 06/01/17 1208 61     Resp 06/01/17 1208 16     Temp 06/01/17 1208 98.9 F (37.2 C)     Temp Source 06/01/17 1208 Oral     SpO2 06/01/17 1208 99 %     Weight 06/01/17 1209 189 lb (85.7 kg)     Height 06/01/17 1209 5\' 5"  (1.651 m)     Head Circumference --      Peak Flow --      Pain Score 06/01/17 1208 10     Pain Loc --      Pain Edu? --      Excl. in GC? --     Constitutional: Alert and oriented. Well appearing and in no acute distress. Eyes: Conjunctivae are normal.  Head: Atraumatic. Neck: No stridor.   Cardiovascular: Normal rate, regular rhythm. Grossly normal heart sounds.  Good peripheral circulation. Respiratory: Normal respiratory effort.  No retractions. Lungs CTAB. Gastrointestinal: Soft and nontender. No distention. Musculoskeletal: there is no gross deformity noted of the right knee. There is degenerative changes present. Soft tissue tenderness noted. No effusion is appreciated. Patient guards against range of motion secondary to  her pain. Motor sensory function intact distal to her injury. Skin is intact. No ecchymosis or abrasions are noted. Neurologic:  Normal speech and language. No gross focal neurologic deficits are appreciated.  Skin:  Skin is warm, dry and intact.  Psychiatric: Mood and affect are normal. Speech and behavior are normal.  ____________________________________________   LABS (all labs ordered are listed, but only abnormal results are displayed)  Labs Reviewed - No data to display  RADIOLOGY  Dg Knee Complete 4 Views Right  Result Date: 06/01/2017 CLINICAL DATA:  Increasing pain post fall. 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 Stable changes of total right knee arthroplasty with normal alignment of the prostatic components. Small amount of cement extrusion along the lateral aspect of the lateral joint compartment is stable from 05/10/2017. IMPRESSION: No acute fracture or dislocation identified about the right Knee. Stable appearance of total right knee arthroplasty. Electronically Signed   By: Ted Mcalpine M.D.   On: 06/01/2017 13:49    ____________________________________________   PROCEDURES  Procedure(s) performed: None  Procedures  Critical Care performed: No  ____________________________________________   INITIAL IMPRESSION / ASSESSMENT AND PLAN / ED COURSE  As part of my medical decision making, I reviewed the following data within the electronic MEDICAL RECORD NUMBER Notes from prior ED visits and [place] Controlled Substance Database  patient states that she has multiple knee immobilizers at home along with a walker and cane. We discussed her conversation with Dr. Neomia Glass nurse yesterday and it is documented that she called on 05/31/17. There is no mention in the nurse's note of patient's stating that she fell on her right knee. It  appears that patient called only for a Letter and also to obtain pain medication. Patient states that she has an appointment with an orthopedist at [facility] the first of the year for  her chronic knee pain.  She is encouraged to call Monday see if they have an earlier appointment available. Patient was given Toradol 30 mg IM and Norco one by mouth while in the department. She was given a limited number of Norco and Toradol to last her for the next 2 days. Patient is to ice and elevate her knee as needed for pain. We again discussed pain management but she wants to see the orthopedist instead.  ____________________________________________   FINAL CLINICAL IMPRESSION(S) / ED DIAGNOSES  Final diagnoses:  Acute pain of right knee  Chronic pain of right knee     ED Discharge Orders        Ordered    HYDROcodone-acetaminophen (NORCO/VICODIN) 5-325 MG tablet  Every 8 hours PRN     06/01/17 1418    ketorolac (TORADOL) 10 MG tablet  Every 8 hours PRN     06/01/17 1418       Note:  This document was prepared using Dragon voice recognition software and may include unintentional dictation errors.    Tommi Rumps, PA-C 06/01/17 1502    Willy Eddy, MD 06/01/17 [phone]

## 2017-06-01 NOTE — ED Notes (Addendum)
Pt states she got a right knee replacement in May 2018. She reports having pain since surgery d/t the screws not being in the correct place. Pt stated MD that did surgery knows there was an issue with the first surgery. Pt reports falling last night after slipping on ice which increased the pain. Pt very frustrated at bedside and hopes for referral to see another doctor. Kennedy BuckerMichael Menz, MD is original MD who did first surgery. Pt wants to be referred to another provider, even in same practice, to fix issue with knee.

## 2017-06-01 NOTE — ED Notes (Signed)
Pt pushed to lobby in wheelchair, while family went to get car. She was able to bear weight and ambulate when getting in wheelchair. VSS. NAD, resp non-labored and equal. No questions or concerns voiced during discharge.

## 2017-06-01 NOTE — Discharge Instructions (Signed)
[facility] Call Monday to see if there is an appointment earlier to be seen. Norco every 8 hours for pain and toradol 10mg  for the next 2 days. Ice and elevation. Use walker or cane for added support.

## 2017-06-01 NOTE — ED Triage Notes (Addendum)
Patient presents to the ED with chronic right knee pain post knee replacement.  Patient is in no obvious distress at this time.  Patient ambulatory to triage with a limp.  Patient's knee replacement was in May.  Patient states she slipped earlier this week on the ice and pain has been worse since that time.

## 2017-07-13 ENCOUNTER — Other Ambulatory Visit: Payer: Self-pay

## 2017-07-13 ENCOUNTER — Emergency Department: Payer: Medicaid Other

## 2017-07-13 ENCOUNTER — Emergency Department
Admission: EM | Admit: 2017-07-13 | Discharge: 2017-07-13 | Disposition: A | Discharge status: Home / Self Care | Payer: Medicaid Other | Attending: Emergency Medicine | Admitting: Emergency Medicine

## 2017-07-13 ENCOUNTER — Encounter: Payer: Self-pay | Admitting: Emergency Medicine

## 2017-07-13 DIAGNOSIS — S6991XA Unspecified injury of right wrist, hand and finger(s), initial encounter: Secondary | ICD-10-CM | POA: Diagnosis present

## 2017-07-13 DIAGNOSIS — S60221A Contusion of right hand, initial encounter: Secondary | ICD-10-CM

## 2017-07-13 DIAGNOSIS — Z79899 Other long term (current) drug therapy: Secondary | ICD-10-CM | POA: Diagnosis not present

## 2017-07-13 DIAGNOSIS — Z7982 Long term (current) use of aspirin: Secondary | ICD-10-CM | POA: Diagnosis not present

## 2017-07-13 DIAGNOSIS — S62664A Nondisplaced fracture of distal phalanx of right ring finger, initial encounter for closed fracture: Secondary | ICD-10-CM | POA: Insufficient documentation

## 2017-07-13 DIAGNOSIS — Z87891 Personal history of nicotine dependence: Secondary | ICD-10-CM | POA: Diagnosis not present

## 2017-07-13 DIAGNOSIS — Y929 Unspecified place or not applicable: Secondary | ICD-10-CM | POA: Diagnosis not present

## 2017-07-13 DIAGNOSIS — R2231 Localized swelling, mass and lump, right upper limb: Secondary | ICD-10-CM | POA: Insufficient documentation

## 2017-07-13 DIAGNOSIS — R609 Edema, unspecified: Secondary | ICD-10-CM

## 2017-07-13 DIAGNOSIS — J45909 Unspecified asthma, uncomplicated: Secondary | ICD-10-CM | POA: Insufficient documentation

## 2017-07-13 DIAGNOSIS — Y999 Unspecified external cause status: Secondary | ICD-10-CM | POA: Insufficient documentation

## 2017-07-13 DIAGNOSIS — W010XXA Fall on same level from slipping, tripping and stumbling without subsequent striking against object, initial encounter: Secondary | ICD-10-CM | POA: Diagnosis not present

## 2017-07-13 DIAGNOSIS — I10 Essential (primary) hypertension: Secondary | ICD-10-CM | POA: Insufficient documentation

## 2017-07-13 DIAGNOSIS — Y9301 Activity, walking, marching and hiking: Secondary | ICD-10-CM | POA: Insufficient documentation

## 2017-07-13 MED ORDER — OXYCODONE-ACETAMINOPHEN 5-325 MG PO TABS
1.0000 | ORAL_TABLET | Freq: Once | ORAL | Status: AC
  Administered 2017-07-13: 1 via ORAL
  Filled 2017-07-13: qty 1

## 2017-07-13 MED ORDER — IBUPROFEN 600 MG PO TABS
600.0000 mg | ORAL_TABLET | Freq: Once | ORAL | Status: AC
  Administered 2017-07-13: 600 mg via ORAL
  Filled 2017-07-13: qty 1

## 2017-07-13 MED ORDER — OXYCODONE-ACETAMINOPHEN 5-325 MG PO TABS: 1.0000 | ORAL_TABLET | Freq: Four times a day (QID) | ORAL | 0 refills | Status: DC | PRN

## 2017-07-13 NOTE — ED Triage Notes (Signed)
Trip and fall at one am. Pain and swelling R hand.

## 2017-07-13 NOTE — ED Provider Notes (Signed)
[facility] Emergency Department Provider Note   ____________________________________________   None    (approximate)  I have reviewed the triage vital signs and the nursing notes.   HISTORY  Chief Complaint Hand Pain    HPI Diane Castillo is a 47 y.o. female patient complaining of right hand pain secondary to a fall.  Patient states has unstable left knee will buckle and to prevent from striking the knee she broke the fall of her right hand.  Patient presents with edema and pain to the right hand.  Patient denies loss of sensation but decreased range of motion limited by complaint of pain.  Patient rates pain as 10/10.  Patient described the pain is "aching".  No pulses measured prior to arrival.  Past Medical History:  Diagnosis Date  . Asthma   . DVT (deep venous thrombosis) (HCC)    right leg  . Hypertension   . Immune deficiency disorder [facility](HCC)     Patient Active Problem List   Diagnosis Date Noted  . Primary localized osteoarthritis of right knee 10/30/2016  . Right leg pain 11/14/2015  . Gastrocnemius muscle strain 11/14/2015  . Hypokalemia 11/14/2015  . Metabolic acidosis 11/14/2015  . Hyperglycemia 11/14/2015  . Drug abuse (HCC) 11/14/2015  . Rhabdomyolysis 11/13/2015    Past Surgical History:  Procedure Laterality Date  . ABDOMINAL HYSTERECTOMY    . CHOLECYSTECTOMY    . HAND SURGERY    . HERNIA REPAIR  2014   ventral  . KNEE CLOSED REDUCTION Right 12/06/2016   Procedure: CLOSED MANIPULATION KNEE;  Surgeon: Kennedy BuckerMenz, Michael, MD;  Location: [facility] ORS;  Service: Orthopedics;  Laterality: Right;  . KNEE SURGERY    . ORIF FINGER FRACTURE Left    middle finger, metal placed  . TUBAL LIGATION      Prior to Admission medications   Medication Sig Start Date End Date Taking? Authorizing Provider  acetaminophen (TYLENOL) 500 MG tablet Take 1,000 mg by mouth daily as needed for moderate pain or headache.    [provider]    albuterol (PROVENTIL HFA) 108 (90 Base) MCG/ACT inhaler Inhale 2 puffs into the lungs every 6 (six) hours as needed for wheezing or shortness of breath.  10/03/15   [provider]  aspirin EC 81 MG tablet Take 81 mg by mouth daily.     [provider]  Aspirin-Salicylamide-Caffeine (ARTHRITIS STRENGTH BC POWDER PO) Take 1 packet by mouth daily as needed (pain).    [provider]  diphenhydrAMINE (BENADRYL) 25 MG tablet Take 25 mg by mouth daily as needed for allergies.    [provider]  docusate sodium (COLACE) 100 MG capsule Take 1 capsule (100 mg total) by mouth 2 (two) times daily. Patient taking differently: Take 100 mg by mouth daily.  11/14/15   Katharina CaperVaickute, Rima, MD  dolutegravir (TIVICAY) 50 MG tablet Take 50 mg by mouth daily. 05/16/15   [provider]  emtricitabine-tenofovir AF (DESCOVY) 200-25 MG tablet Take 1 tablet by mouth daily.    [provider]  enalapril (VASOTEC) 10 MG tablet Take 10 mg by mouth daily.    [provider]  HYDROcodone-acetaminophen (NORCO/VICODIN) 5-325 MG tablet Take 1 tablet by mouth every 8 (eight) hours as needed for moderate pain. 06/01/17   Tommi RumpsSummers, Rhonda L, PA-C  ketorolac (TORADOL) 10 MG tablet Take 1 tablet (10 mg total) by mouth every 8 (eight) hours as needed for moderate pain. 06/01/17   Tommi RumpsSummers, Rhonda L, PA-C  Menthol, Topical Analgesic, (ICY HOT EX) Apply 1 application topically daily as needed (pain).    [provider]  oxyCODONE-acetaminophen (PERCOCET/ROXICET) 5-325 MG tablet Take 1 tablet by mouth every 6 (six) hours as needed for severe pain. 07/13/17   Joni Reining, PA-C  valACYclovir (VALTREX) 500 MG tablet Take 500 mg by mouth 2 (two) times daily. 10/03/15   [provider]    Allergies Penicillins and Tramadol  Family History  Problem Relation Age of Onset  . Heart disease Mother     Social History Social History   Tobacco Use  . Smoking  status: Former Smoker    Packs/day: 0.25    Types: Cigarettes    Last attempt to quit: 09/15/2016    Years since quitting: 0.8  . Smokeless tobacco: Never Used  Substance Use Topics  . Alcohol use: No  . Drug use: Yes    Types: Marijuana    Comment: daily mj - hx of cocaine use 2 years ago    Review of Systems  Constitutional: No fever/chills Eyes: No visual changes. ENT: No sore throat. Cardiovascular: Denies chest pain. Respiratory: Denies shortness of breath. Gastrointestinal: No abdominal pain.  No nausea, no vomiting.  No diarrhea.  No constipation. Genitourinary: Negative for dysuria. Musculoskeletal: Negative for back pain. Skin: Negative for rash. Neurological: Negative for headaches, focal weakness or numbness. Psychiatric:.  Drug abuse. Endocrine:Diabetes and hypokalemia.  Allergic/Immunilogical: Immune deficiency disorder. ____________________________________________   PHYSICAL EXAM:  VITAL SIGNS: ED Triage Vitals  Enc Vitals Group     BP 07/13/17 0822 (!) 179/97     Pulse Rate 07/13/17 0822 (!) 56     Resp 07/13/17 0822 18     Temp 07/13/17 0822 97.9 F (36.6 C)     Temp Source 07/13/17 0822 Oral     SpO2 07/13/17 0822 100 %     Weight 07/13/17 0823 236 lb (107 kg)     Height 07/13/17 0823 5\' 5"  (1.651 m)     Head Circumference --      Peak Flow --      Pain Score 07/13/17 0822 10     Pain Loc --      Pain Edu? --      Excl. in GC? --    Constitutional: Alert and oriented. Well appearing and in no acute distress. Cardiovascular: Normal rate, regular rhythm. Grossly normal heart sounds.  Good peripheral circulation.  Elevated blood pressure Respiratory: Normal respiratory effort.  No retractions. Lungs CTAB. Musculoskeletal: No obvious deformity of the right hand.  Moderate edema.  Tender palpation at the fourth and fifth metacarpal.. Neurologic:  Normal speech and language. No gross focal neurologic deficits are appreciated. No gait  instability. Skin:  Skin is warm, dry and intact. No rash noted. Psychiatric: Mood and affect are normal. Speech and behavior are normal.  ____________________________________________   LABS (all labs ordered are listed, but only abnormal results are displayed)  Labs Reviewed - No data to display ____________________________________________  EKG   ____________________________________________  RADIOLOGY  US Venous Img Upper Uni Right  Result Date: 07/13/2017 CLINICAL DATA:  Right arm pain and swelling following fall EXAM: RIGHT UPPER EXTREMITY VENOUS DOPPLER ULTRASOUND TECHNIQUE: Gray-scale sonography with graded compression, as well as color Doppler and duplex ultrasound were performed to evaluate the upper extremity deep venous system from the level of the subclavian vein and including the jugular, axillary, basilic, radial, ulnar and upper cephalic vein. Spectral Doppler was utilized to evaluate flow at rest  and with distal augmentation maneuvers. COMPARISON:  None. FINDINGS: Contralateral Subclavian Vein: Respiratory phasicity is normal and symmetric with the symptomatic side. No evidence of thrombus. Normal compressibility. Internal Jugular Vein: No evidence of thrombus. Normal compressibility, respiratory phasicity and response to augmentation. Subclavian Vein: No evidence of thrombus. Normal compressibility, respiratory phasicity and response to augmentation. Axillary Vein: No evidence of thrombus. Normal compressibility, respiratory phasicity and response to augmentation. Cephalic Vein: No evidence of thrombus. Normal compressibility, respiratory phasicity and response to augmentation. Basilic Vein: No evidence of thrombus. Normal compressibility, respiratory phasicity and response to augmentation. Brachial Veins: No evidence of thrombus. Normal compressibility, respiratory phasicity and response to augmentation. Radial Veins: No evidence of thrombus. Normal compressibility, respiratory  phasicity and response to augmentation. Ulnar Veins: No evidence of thrombus. Normal compressibility, respiratory phasicity and response to augmentation. Venous Reflux:  None visualized. Other Findings:  None visualized. IMPRESSION: No evidence of DVT within the right upper extremity. Electronically Signed   By: Alcide Clever M.D.   On: 07/13/2017 10:44   Dg Hand Complete Right  Addendum Date: 07/13/2017   ADDENDUM REPORT: 07/13/2017 10:45 ADDENDUM: Further evaluation of the images reveals an occult fracture at the base of the fifth metacarpal. Electronically Signed   By: Alcide Clever M.D.   On: 07/13/2017 10:45   Result Date: 07/13/2017 CLINICAL DATA:  Fall yesterday with right hand pain, initial encounter EXAM: RIGHT HAND - COMPLETE 3+ VIEW COMPARISON:  01/13/2011 FINDINGS: Mild buckle fracture of the fourth proximal phalanx is noted distally. Mild soft tissue swelling is seen. No other fractures are noted. IMPRESSION: Mild buckle fracture of the fourth proximal phalanx Electronically Signed: By: Alcide Clever M.D. On: 07/13/2017 08:59    ____________________________________________   PROCEDURES  Procedure(s) performed: None  .Splint Application Date/Time: 07/13/2017 11:07 AM Performed by: Marguerita Merles, NT Authorized by: Joni Reining, PA-C   Procedure details:    Supplies:  Ortho-Glass, cotton padding, aluminum splint and sling Post-procedure details:    Pain:  Unchanged   Sensation:  Normal   Patient tolerance of procedure:  Tolerated well, no immediate complications      Critical Care performed: No  ____________________________________________   INITIAL IMPRESSION / ASSESSMENT AND PLAN / ED COURSE  As part of my medical decision making, I reviewed the following data within the electronic MEDICAL RECORD NUMBER A consult was requested and obtained from this/these consultant(s) , Notes from prior ED visits and [place] Controlled Substance Database   Buckle fracture proximal  phalanges fourth digit right hand and sprained wrist.  Patient was placed in OCL volar splint secondary to the amount of edema.  Status post application of splints patient right hand was found to be neurovascular intact.  Patient advised to follow orthopedics and take medication as directed.  Patient given a work note.      ____________________________________________   FINAL CLINICAL IMPRESSION(S) / ED DIAGNOSES  Final diagnoses:  Edema  Contusion of right hand, initial encounter  Closed nondisplaced fracture of distal phalanx of right ring finger, initial encounter     ED Discharge Orders        Ordered    oxyCODONE-acetaminophen (PERCOCET/ROXICET) 5-325 MG tablet  Every 6 hours PRN     07/13/17 1120       Note:  This document was prepared using Dragon voice recognition software and may include unintentional dictation errors.    Joni Reining, PA-C 07/13/17 1121    Arnaldo Natal, MD 07/13/17 [phone]

## 2017-07-13 NOTE — Discharge Instructions (Signed)
Sling and finger splint until evaluation by orthopedic doctor.

## 2017-07-16 DIAGNOSIS — S62346A Nondisplaced fracture of base of fifth metacarpal bone, right hand, initial encounter for closed fracture: Secondary | ICD-10-CM | POA: Diagnosis not present

## 2017-07-16 DIAGNOSIS — S62364A Nondisplaced fracture of neck of fourth metacarpal bone, right hand, initial encounter for closed fracture: Secondary | ICD-10-CM | POA: Diagnosis not present

## 2017-09-28 ENCOUNTER — Encounter: Payer: Self-pay | Admitting: Emergency Medicine

## 2017-09-28 ENCOUNTER — Emergency Department
Admission: EM | Admit: 2017-09-28 | Discharge: 2017-09-28 | Disposition: A | Discharge status: Home / Self Care | Payer: Medicaid Other | Attending: Emergency Medicine | Admitting: Emergency Medicine

## 2017-09-28 ENCOUNTER — Emergency Department: Payer: Medicaid Other

## 2017-09-28 DIAGNOSIS — Z87891 Personal history of nicotine dependence: Secondary | ICD-10-CM | POA: Insufficient documentation

## 2017-09-28 DIAGNOSIS — I10 Essential (primary) hypertension: Secondary | ICD-10-CM | POA: Insufficient documentation

## 2017-09-28 DIAGNOSIS — Z79899 Other long term (current) drug therapy: Secondary | ICD-10-CM | POA: Diagnosis not present

## 2017-09-28 DIAGNOSIS — M79672 Pain in left foot: Secondary | ICD-10-CM | POA: Diagnosis present

## 2017-09-28 DIAGNOSIS — Z7982 Long term (current) use of aspirin: Secondary | ICD-10-CM | POA: Insufficient documentation

## 2017-09-28 DIAGNOSIS — J45909 Unspecified asthma, uncomplicated: Secondary | ICD-10-CM | POA: Diagnosis not present

## 2017-09-28 MED ORDER — LIDOCAINE 0.5 % EX GEL: 1.0000 "application " | Freq: Three times a day (TID) | CUTANEOUS | 0 refills | Status: DC

## 2017-09-28 MED ORDER — NAPROXEN 500 MG PO TABS
500.0000 mg | ORAL_TABLET | Freq: Once | ORAL | Status: AC
  Administered 2017-09-28: 500 mg via ORAL
  Filled 2017-09-28: qty 1

## 2017-09-28 MED ORDER — NAPROXEN 250 MG PO TABS: 250.0000 mg | ORAL_TABLET | Freq: Two times a day (BID) | ORAL | 0 refills | Status: AC

## 2017-09-28 NOTE — ED Provider Notes (Signed)
[facility] Emergency Department Provider Note  ____________________________________________  Time seen: Approximately 9:34 AM  I have reviewed the triage vital signs and the nursing notes.   HISTORY  Chief Complaint Foot Pain    HPI Diane Castillo is a 47 y.o. female that presents emergency department for evaluation of foot pain for 2 days.  Patient states that she dropped a can on the top of her foot earlier this week but the outside of her foot did not start hurting until 2 days ago.  She states that the outside of her foot is swollen and painful to walk on.  She thought maybe she had gout but has never had gout in the past.  No alleviating measures have been attempted.  She denies fever, chills, leg pain, numbness, tingling.    Past Medical History:  Diagnosis Date  . Asthma   . DVT (deep venous thrombosis) (HCC)    right leg  . Hypertension   . Immune deficiency disorder [facility])     Patient Active Problem List   Diagnosis Date Noted  . Primary localized osteoarthritis of right knee 10/30/2016  . Right leg pain 11/14/2015  . Gastrocnemius muscle strain 11/14/2015  . Hypokalemia 11/14/2015  . Metabolic acidosis 11/14/2015  . Hyperglycemia 11/14/2015  . Drug abuse (HCC) 11/14/2015  . Rhabdomyolysis 11/13/2015    Past Surgical History:  Procedure Laterality Date  . ABDOMINAL HYSTERECTOMY    . CHOLECYSTECTOMY    . HAND SURGERY    . HERNIA REPAIR  2014   ventral  . KNEE CLOSED REDUCTION Right 12/06/2016   Procedure: CLOSED MANIPULATION KNEE;  Surgeon: Kennedy Bucker, MD;  Location: [facility] ORS;  Service: Orthopedics;  Laterality: Right;  . KNEE SURGERY    . ORIF FINGER FRACTURE Left    middle finger, metal placed  . TUBAL LIGATION      Prior to Admission medications   Medication Sig Start Date End Date Taking? Authorizing Provider  acetaminophen (TYLENOL) 500 MG tablet Take 1,000 mg by mouth daily as needed for moderate pain or headache.     [provider]  albuterol (PROVENTIL HFA) 108 (90 Base) MCG/ACT inhaler Inhale 2 puffs into the lungs every 6 (six) hours as needed for wheezing or shortness of breath.  10/03/15   [provider]  aspirin EC 81 MG tablet Take 81 mg by mouth daily.     [provider]  Aspirin-Salicylamide-Caffeine (ARTHRITIS STRENGTH BC POWDER PO) Take 1 packet by mouth daily as needed (pain).    [provider]  diphenhydrAMINE (BENADRYL) 25 MG tablet Take 25 mg by mouth daily as needed for allergies.    [provider]  docusate sodium (COLACE) 100 MG capsule Take 1 capsule (100 mg total) by mouth 2 (two) times daily. Patient taking differently: Take 100 mg by mouth daily.  11/14/15   Katharina Caper, MD  dolutegravir (TIVICAY) 50 MG tablet Take 50 mg by mouth daily. 05/16/15   [provider]  emtricitabine-tenofovir AF (DESCOVY) 200-25 MG tablet Take 1 tablet by mouth daily.    [provider]  enalapril (VASOTEC) 10 MG tablet Take 10 mg by mouth daily.    [provider]  HYDROcodone-acetaminophen (NORCO/VICODIN) 5-325 MG tablet Take 1 tablet by mouth every 8 (eight) hours as needed for moderate pain. 06/01/17   Tommi Rumps, PA-C  ketorolac (TORADOL) 10 MG tablet Take 1 tablet (10 mg total) by mouth every 8 (eight) hours as needed for moderate pain.  06/01/17   Tommi Rumps, PA-C  Lidocaine 0.5 % GEL Apply 1 application topically 3 (three) times daily. 09/28/17   Enid Derry, PA-C  Menthol, Topical Analgesic, (ICY HOT EX) Apply 1 application topically daily as needed (pain).    [provider]  naproxen (NAPROSYN) 250 MG tablet Take 1 tablet (250 mg total) by mouth 2 (two) times daily with a meal for 10 days. 09/28/17 10/08/17  Enid Derry, PA-C  oxyCODONE-acetaminophen (PERCOCET/ROXICET) 5-325 MG tablet Take 1 tablet by mouth every 6 (six) hours as needed for severe pain. 07/13/17   Joni Reining, PA-C  valACYclovir  (VALTREX) 500 MG tablet Take 500 mg by mouth 2 (two) times daily. 10/03/15   [provider]    Allergies Penicillins and Tramadol  Family History  Problem Relation Age of Onset  . Heart disease Mother     Social History Social History   Tobacco Use  . Smoking status: Former Smoker    Packs/day: 0.25    Types: Cigarettes    Last attempt to quit: 09/15/2016    Years since quitting: 1.0  . Smokeless tobacco: Never Used  Substance Use Topics  . Alcohol use: No  . Drug use: Yes    Types: Marijuana    Comment: daily mj - hx of cocaine use 2 years ago     Review of Systems  Constitutional: No fever/chills. Gastrointestinal: No abdominal pain.  No nausea, no vomiting.  Musculoskeletal: Positive for foot pain. Skin: Negative for rash, abrasions, lacerations, ecchymosis. Neurological: Negative for numbness or tingling   ____________________________________________   PHYSICAL EXAM:  VITAL SIGNS: ED Triage Vitals  Enc Vitals Group     BP 09/28/17 0857 121/68     Pulse Rate 09/28/17 0857 65     Resp 09/28/17 0857 18     Temp 09/28/17 0857 98.6 F (37 C)     Temp Source 09/28/17 0857 Oral     SpO2 09/28/17 0857 100 %     Weight 09/28/17 0855 183 lb (83 kg)     Height 09/28/17 0855 5\' 5"  (1.651 m)     Head Circumference --      Peak Flow --      Pain Score 09/28/17 0855 7     Pain Loc --      Pain Edu? --      Excl. in GC? --      Constitutional: Alert and oriented. Well appearing and in no acute distress. Eyes: Conjunctivae are normal. PERRL. EOMI. Head: Atraumatic. ENT:      Ears:      Nose: No congestion/rhinnorhea.      Mouth/Throat: Mucous membranes are moist.  Neck: No stridor.  Cardiovascular: Normal rate, regular rhythm.  Good peripheral circulation.  Symmetric dorsalis pedis pulses bilaterally. Respiratory: Normal respiratory effort without tachypnea or retractions. Lungs CTAB. Good air entry to the bases with no decreased or absent breath  sounds. Musculoskeletal: Full range of motion to all extremities. No gross deformities appreciated.  Tenderness to palpation over lateral foot.  No tenderness to palpation over calf or ankle.  No visible swelling or ecchymosis. Neurologic:  Normal speech and language. No gross focal neurologic deficits are appreciated.  Skin:  Skin is warm, dry and intact. No rash noted.    ____________________________________________   LABS (all labs ordered are listed, but only abnormal results are displayed)  Labs Reviewed - No data to display ____________________________________________  EKG   ____________________________________________  RADIOLOGY Lexine Baton, personally  viewed and evaluated these images (plain radiographs) as part of my medical decision making, as well as reviewing the written report by the radiologist.  Dg Foot Complete Left  Result Date: 09/28/2017 CLINICAL DATA:  Dropped large can on left foot 2 weeks ago, still has pain left proximal 5th MT without bruising or swelling EXAM: LEFT FOOT - COMPLETE 3+ VIEW COMPARISON:  None. FINDINGS: No fracture line or displaced fracture fragment seen. Degenerative osteoarthritis at the first MTP joint, mild to moderate in degree. Soft tissues are unremarkable. IMPRESSION: No fracture or dislocation. Electronically Signed   By: Bary RichardStan  Maynard M.D.   On: 09/28/2017 09:20    ____________________________________________    PROCEDURES  Procedure(s) performed:    Procedures    Medications  naproxen (NAPROSYN) tablet 500 mg (500 mg Oral Given 09/28/17 1013)     ____________________________________________   INITIAL IMPRESSION / ASSESSMENT AND PLAN / ED COURSE  Pertinent labs & imaging results that were available during my care of the patient were reviewed by me and considered in my medical decision making (see chart for details).  Review of the [place] CSRS was performed in accordance of the [facility] prior to dispensing any  controlled drugs.   Patient presented to the emergency department for evaluation of lateral foot pain for 2 days vital signs and exam are reassuring.  X-ray negative for acute bony abnormalities..  Patient will be discharged home with prescriptions for a low dose of naproxen and lidocaine gel. Patient is to follow up with podiatry as directed. Patient is given ED precautions to return to the ED for any worsening or new symptoms.   ____________________________________________  FINAL CLINICAL IMPRESSION(S) / ED DIAGNOSES  Final diagnoses:  Acute pain of left foot      NEW MEDICATIONS STARTED DURING THIS VISIT:  ED Discharge Orders        Ordered    naproxen (NAPROSYN) 250 MG tablet  2 times daily with meals     09/28/17 1005    Lidocaine 0.5 % GEL  3 times daily     09/28/17 1007          This chart was dictated using voice recognition software/Dragon. Despite best efforts to proofread, errors can occur which can change the meaning. Any change was purely unintentional.    Enid DerryWagner, Loucille Takach, PA-C 09/28/17 1252    Minna AntisPaduchowski, Kevin, MD 09/28/17 1320

## 2017-09-28 NOTE — ED Triage Notes (Signed)
Pt comes into the ED via POV c/o left foot pain after dropping a large can on the top of it.  Patient states there is a swelling noted to the foot and extreme tenderness that is now causing a change in her gait.  Patient in NAD at this time.

## 2017-09-28 NOTE — ED Notes (Signed)
See triage note  Presents with left foot pain for the past couple of days  States she dropped a can on foot  But pain is lateral aspect of foot  Unable to bear full wt  Good pulses

## 2017-10-17 DIAGNOSIS — M79672 Pain in left foot: Secondary | ICD-10-CM | POA: Diagnosis not present

## 2017-10-17 DIAGNOSIS — M7672 Peroneal tendinitis, left leg: Secondary | ICD-10-CM | POA: Diagnosis not present

## 2017-12-16 DIAGNOSIS — M170 Bilateral primary osteoarthritis of knee: Secondary | ICD-10-CM | POA: Diagnosis not present

## 2017-12-17 DIAGNOSIS — M170 Bilateral primary osteoarthritis of knee: Secondary | ICD-10-CM | POA: Diagnosis not present

## 2017-12-18 DIAGNOSIS — M170 Bilateral primary osteoarthritis of knee: Secondary | ICD-10-CM | POA: Diagnosis not present

## 2017-12-18 IMAGING — DX DG KNEE COMPLETE 4+V*R*
4 series · 4 of 4 positions shown · non-contrast
Comparison: 05/10/2017

CLINICAL DATA: Increasing pain post fall. History of total right
knee arthroplasty in October 2016

EXAM:
RIGHT KNEE - COMPLETE 4+ VIEW

[knee ap]
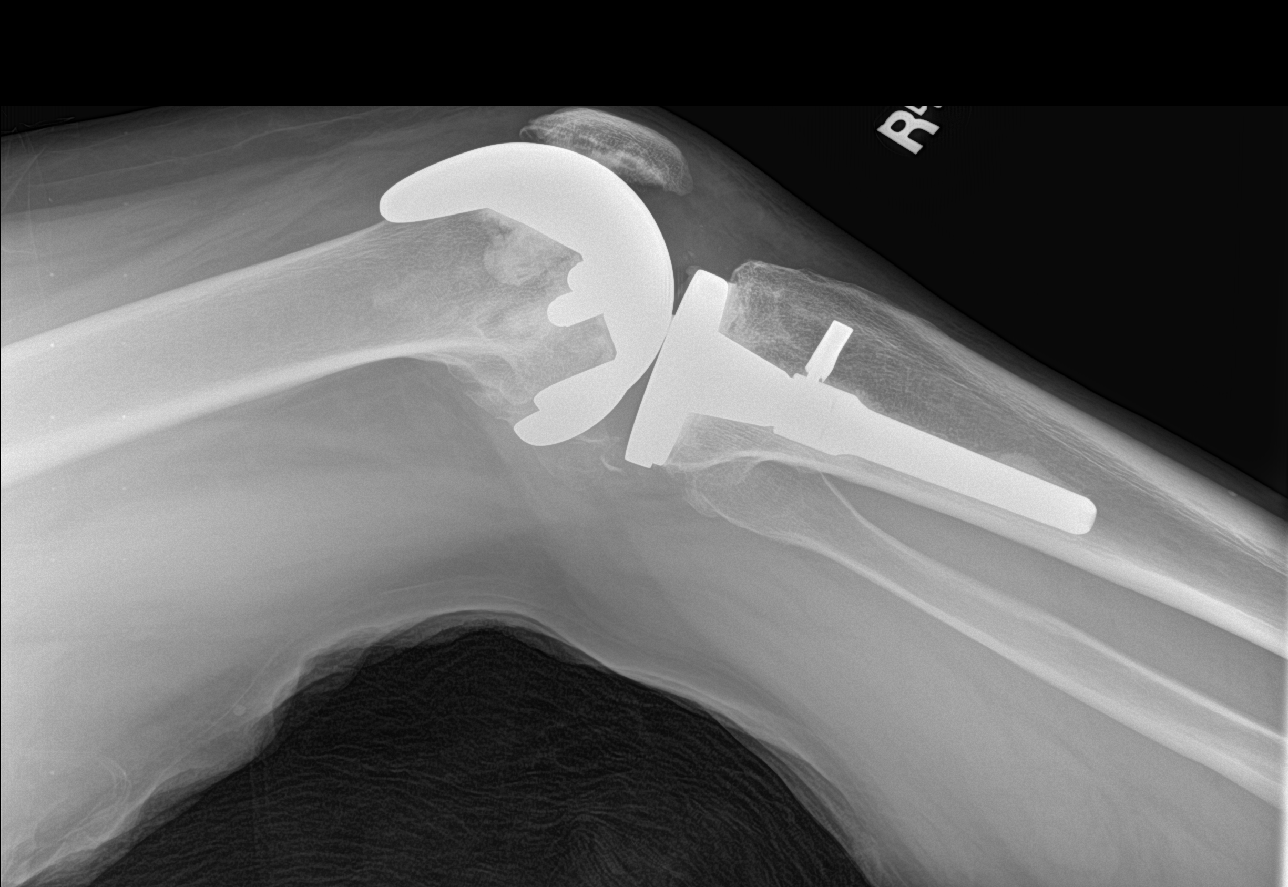

[knee obl (1 of 2)]
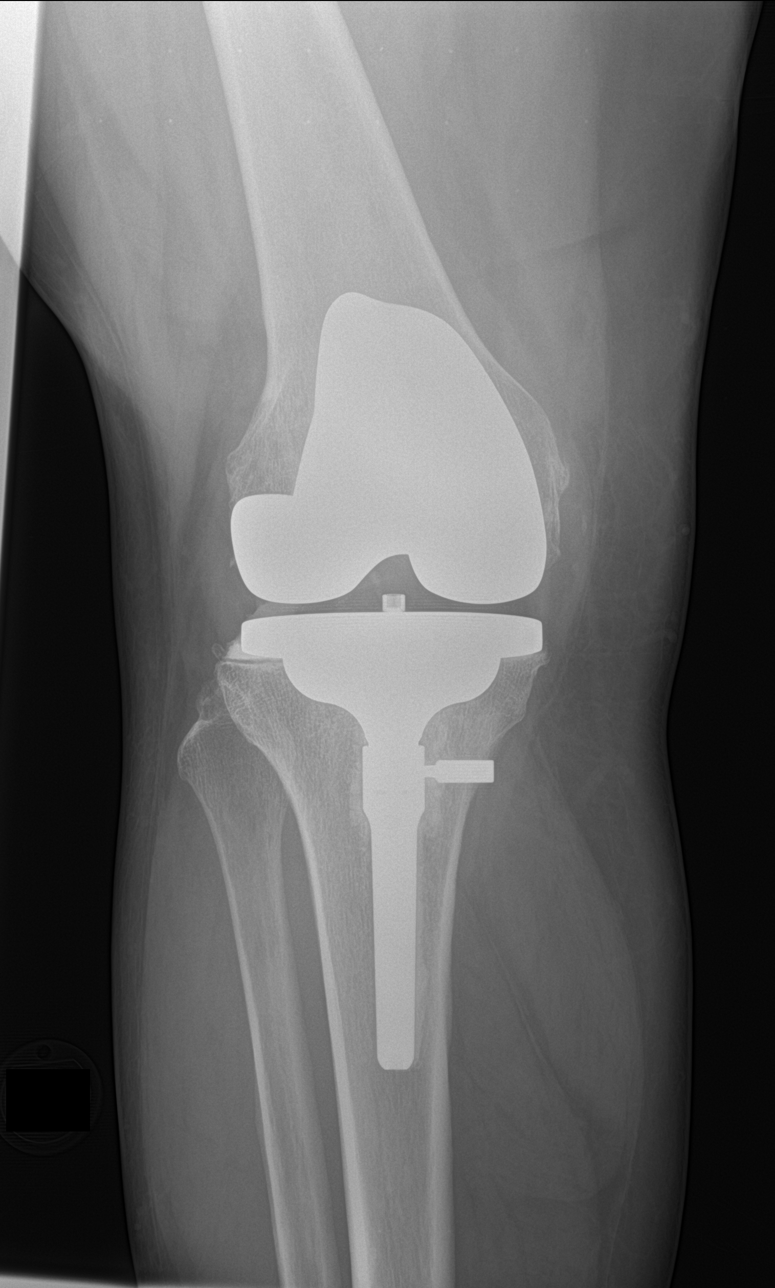

[knee obl (2 of 2)]
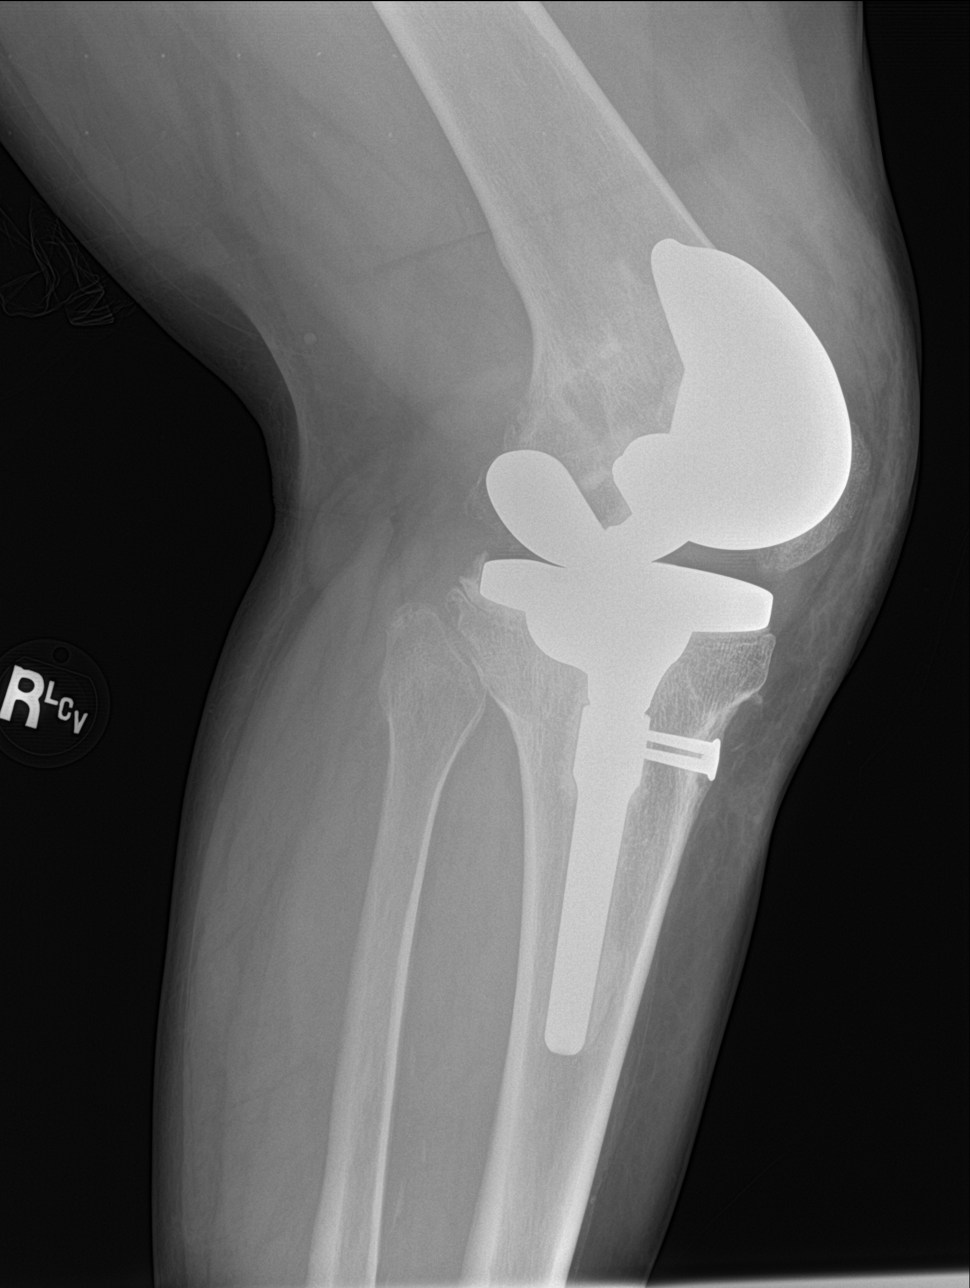

[knee lat]
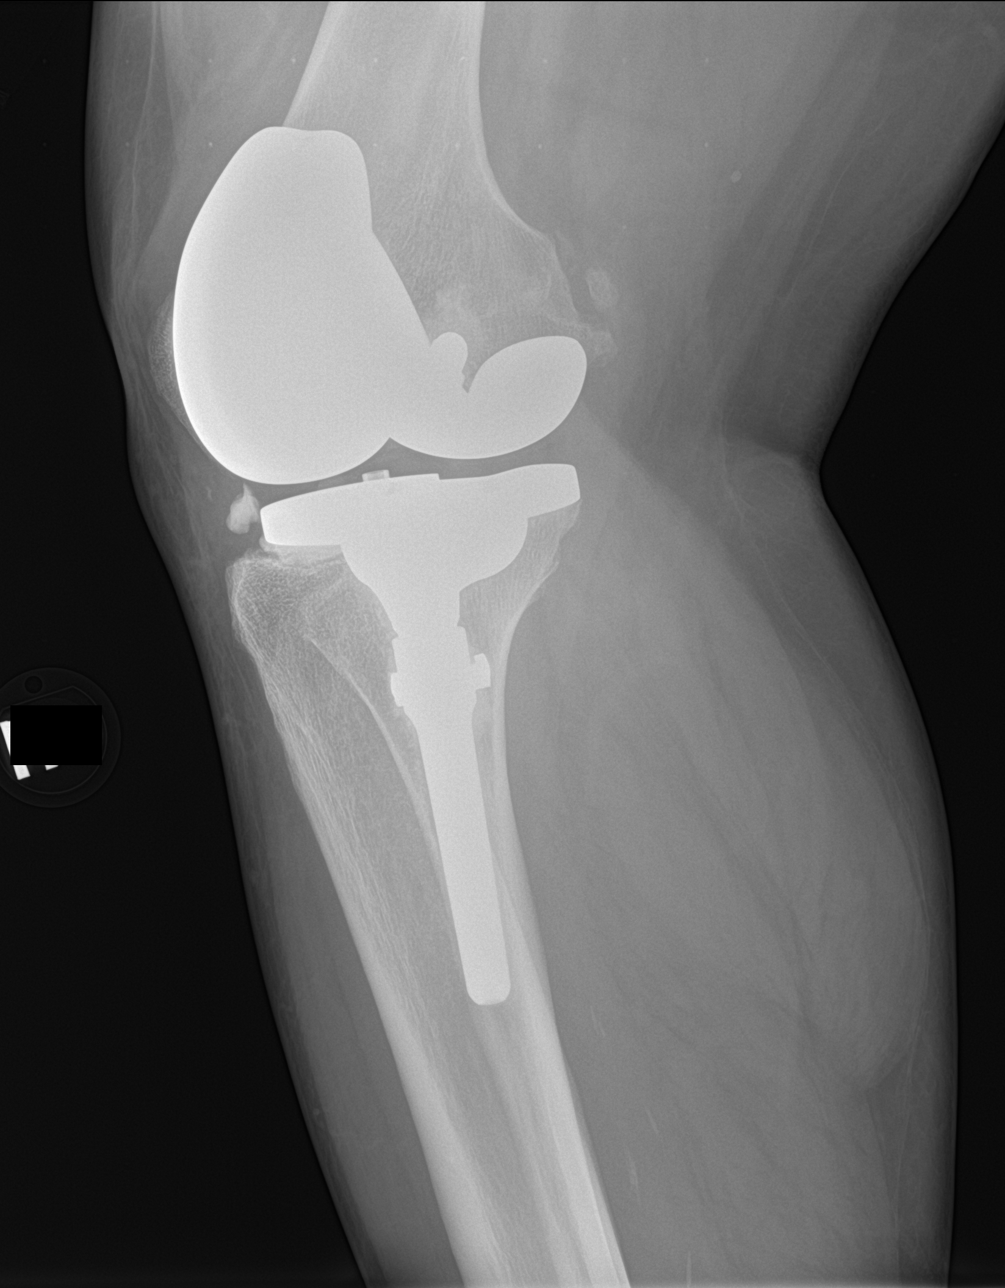

[4 of 4 positions shown; findings below may reference images not displayed]

FINDINGS: No evidence of fracture, dislocation, or joint effusion. Stable
changes of total right knee arthroplasty with normal alignment of
the prostatic components. Small amount of cement extrusion along the
lateral aspect of the lateral joint compartment is stable from
05/10/2017.
IMPRESSION: No acute fracture or dislocation identified about the right Knee.

Stable appearance of total right knee arthroplasty.

## 2017-12-19 DIAGNOSIS — M170 Bilateral primary osteoarthritis of knee: Secondary | ICD-10-CM | POA: Diagnosis not present

## 2017-12-20 DIAGNOSIS — M170 Bilateral primary osteoarthritis of knee: Secondary | ICD-10-CM | POA: Diagnosis not present

## 2017-12-21 DIAGNOSIS — M170 Bilateral primary osteoarthritis of knee: Secondary | ICD-10-CM | POA: Diagnosis not present

## 2017-12-22 DIAGNOSIS — M170 Bilateral primary osteoarthritis of knee: Secondary | ICD-10-CM | POA: Diagnosis not present

## 2017-12-23 DIAGNOSIS — M170 Bilateral primary osteoarthritis of knee: Secondary | ICD-10-CM | POA: Diagnosis not present

## 2017-12-24 DIAGNOSIS — M170 Bilateral primary osteoarthritis of knee: Secondary | ICD-10-CM | POA: Diagnosis not present

## 2017-12-25 DIAGNOSIS — M170 Bilateral primary osteoarthritis of knee: Secondary | ICD-10-CM | POA: Diagnosis not present

## 2017-12-26 DIAGNOSIS — M170 Bilateral primary osteoarthritis of knee: Secondary | ICD-10-CM | POA: Diagnosis not present

## 2017-12-27 DIAGNOSIS — M170 Bilateral primary osteoarthritis of knee: Secondary | ICD-10-CM | POA: Diagnosis not present

## 2017-12-28 DIAGNOSIS — M170 Bilateral primary osteoarthritis of knee: Secondary | ICD-10-CM | POA: Diagnosis not present

## 2017-12-29 DIAGNOSIS — M170 Bilateral primary osteoarthritis of knee: Secondary | ICD-10-CM | POA: Diagnosis not present

## 2017-12-30 DIAGNOSIS — M170 Bilateral primary osteoarthritis of knee: Secondary | ICD-10-CM | POA: Diagnosis not present

## 2017-12-31 DIAGNOSIS — M170 Bilateral primary osteoarthritis of knee: Secondary | ICD-10-CM | POA: Diagnosis not present

## 2018-01-01 DIAGNOSIS — M170 Bilateral primary osteoarthritis of knee: Secondary | ICD-10-CM | POA: Diagnosis not present

## 2018-01-02 DIAGNOSIS — M170 Bilateral primary osteoarthritis of knee: Secondary | ICD-10-CM | POA: Diagnosis not present

## 2018-01-03 DIAGNOSIS — M170 Bilateral primary osteoarthritis of knee: Secondary | ICD-10-CM | POA: Diagnosis not present

## 2018-01-04 DIAGNOSIS — M170 Bilateral primary osteoarthritis of knee: Secondary | ICD-10-CM | POA: Diagnosis not present

## 2018-01-05 DIAGNOSIS — M170 Bilateral primary osteoarthritis of knee: Secondary | ICD-10-CM | POA: Diagnosis not present

## 2018-01-06 DIAGNOSIS — M170 Bilateral primary osteoarthritis of knee: Secondary | ICD-10-CM | POA: Diagnosis not present

## 2018-01-07 DIAGNOSIS — M170 Bilateral primary osteoarthritis of knee: Secondary | ICD-10-CM | POA: Diagnosis not present

## 2018-01-08 DIAGNOSIS — M170 Bilateral primary osteoarthritis of knee: Secondary | ICD-10-CM | POA: Diagnosis not present

## 2018-01-09 DIAGNOSIS — M170 Bilateral primary osteoarthritis of knee: Secondary | ICD-10-CM | POA: Diagnosis not present

## 2018-01-10 DIAGNOSIS — M170 Bilateral primary osteoarthritis of knee: Secondary | ICD-10-CM | POA: Diagnosis not present

## 2018-01-11 DIAGNOSIS — M170 Bilateral primary osteoarthritis of knee: Secondary | ICD-10-CM | POA: Diagnosis not present

## 2018-01-12 DIAGNOSIS — M170 Bilateral primary osteoarthritis of knee: Secondary | ICD-10-CM | POA: Diagnosis not present

## 2018-01-13 DIAGNOSIS — M170 Bilateral primary osteoarthritis of knee: Secondary | ICD-10-CM | POA: Diagnosis not present

## 2018-01-14 DIAGNOSIS — M170 Bilateral primary osteoarthritis of knee: Secondary | ICD-10-CM | POA: Diagnosis not present

## 2018-01-15 DIAGNOSIS — M170 Bilateral primary osteoarthritis of knee: Secondary | ICD-10-CM | POA: Diagnosis not present

## 2018-01-16 DIAGNOSIS — M170 Bilateral primary osteoarthritis of knee: Secondary | ICD-10-CM | POA: Diagnosis not present

## 2018-01-17 DIAGNOSIS — M170 Bilateral primary osteoarthritis of knee: Secondary | ICD-10-CM | POA: Diagnosis not present

## 2018-01-18 DIAGNOSIS — M170 Bilateral primary osteoarthritis of knee: Secondary | ICD-10-CM | POA: Diagnosis not present

## 2018-01-19 DIAGNOSIS — M170 Bilateral primary osteoarthritis of knee: Secondary | ICD-10-CM | POA: Diagnosis not present

## 2018-01-20 DIAGNOSIS — M170 Bilateral primary osteoarthritis of knee: Secondary | ICD-10-CM | POA: Diagnosis not present

## 2018-01-21 DIAGNOSIS — M170 Bilateral primary osteoarthritis of knee: Secondary | ICD-10-CM | POA: Diagnosis not present

## 2018-01-22 DIAGNOSIS — M170 Bilateral primary osteoarthritis of knee: Secondary | ICD-10-CM | POA: Diagnosis not present

## 2018-01-23 DIAGNOSIS — M170 Bilateral primary osteoarthritis of knee: Secondary | ICD-10-CM | POA: Diagnosis not present

## 2018-01-24 DIAGNOSIS — M170 Bilateral primary osteoarthritis of knee: Secondary | ICD-10-CM | POA: Diagnosis not present

## 2018-01-25 DIAGNOSIS — M170 Bilateral primary osteoarthritis of knee: Secondary | ICD-10-CM | POA: Diagnosis not present

## 2018-01-26 DIAGNOSIS — M170 Bilateral primary osteoarthritis of knee: Secondary | ICD-10-CM | POA: Diagnosis not present

## 2018-01-27 DIAGNOSIS — M170 Bilateral primary osteoarthritis of knee: Secondary | ICD-10-CM | POA: Diagnosis not present

## 2018-01-28 DIAGNOSIS — M170 Bilateral primary osteoarthritis of knee: Secondary | ICD-10-CM | POA: Diagnosis not present

## 2018-01-29 DIAGNOSIS — M170 Bilateral primary osteoarthritis of knee: Secondary | ICD-10-CM | POA: Diagnosis not present

## 2018-01-30 DIAGNOSIS — M170 Bilateral primary osteoarthritis of knee: Secondary | ICD-10-CM | POA: Diagnosis not present

## 2018-01-31 DIAGNOSIS — M170 Bilateral primary osteoarthritis of knee: Secondary | ICD-10-CM | POA: Diagnosis not present

## 2018-02-01 DIAGNOSIS — M170 Bilateral primary osteoarthritis of knee: Secondary | ICD-10-CM | POA: Diagnosis not present

## 2018-02-02 DIAGNOSIS — M170 Bilateral primary osteoarthritis of knee: Secondary | ICD-10-CM | POA: Diagnosis not present

## 2018-02-03 DIAGNOSIS — M170 Bilateral primary osteoarthritis of knee: Secondary | ICD-10-CM | POA: Diagnosis not present

## 2018-02-04 DIAGNOSIS — M170 Bilateral primary osteoarthritis of knee: Secondary | ICD-10-CM | POA: Diagnosis not present

## 2018-02-05 DIAGNOSIS — M170 Bilateral primary osteoarthritis of knee: Secondary | ICD-10-CM | POA: Diagnosis not present

## 2018-02-06 DIAGNOSIS — M170 Bilateral primary osteoarthritis of knee: Secondary | ICD-10-CM | POA: Diagnosis not present

## 2018-02-07 DIAGNOSIS — M170 Bilateral primary osteoarthritis of knee: Secondary | ICD-10-CM | POA: Diagnosis not present

## 2018-02-08 DIAGNOSIS — M170 Bilateral primary osteoarthritis of knee: Secondary | ICD-10-CM | POA: Diagnosis not present

## 2018-02-09 DIAGNOSIS — M170 Bilateral primary osteoarthritis of knee: Secondary | ICD-10-CM | POA: Diagnosis not present

## 2018-02-10 DIAGNOSIS — M170 Bilateral primary osteoarthritis of knee: Secondary | ICD-10-CM | POA: Diagnosis not present

## 2018-02-11 DIAGNOSIS — M170 Bilateral primary osteoarthritis of knee: Secondary | ICD-10-CM | POA: Diagnosis not present

## 2018-02-12 DIAGNOSIS — M170 Bilateral primary osteoarthritis of knee: Secondary | ICD-10-CM | POA: Diagnosis not present

## 2018-02-13 DIAGNOSIS — M170 Bilateral primary osteoarthritis of knee: Secondary | ICD-10-CM | POA: Diagnosis not present

## 2018-02-14 DIAGNOSIS — M170 Bilateral primary osteoarthritis of knee: Secondary | ICD-10-CM | POA: Diagnosis not present

## 2018-02-15 DIAGNOSIS — M170 Bilateral primary osteoarthritis of knee: Secondary | ICD-10-CM | POA: Diagnosis not present

## 2018-02-16 DIAGNOSIS — M170 Bilateral primary osteoarthritis of knee: Secondary | ICD-10-CM | POA: Diagnosis not present

## 2018-02-17 DIAGNOSIS — M170 Bilateral primary osteoarthritis of knee: Secondary | ICD-10-CM | POA: Diagnosis not present

## 2018-02-18 DIAGNOSIS — M170 Bilateral primary osteoarthritis of knee: Secondary | ICD-10-CM | POA: Diagnosis not present

## 2018-02-19 DIAGNOSIS — M170 Bilateral primary osteoarthritis of knee: Secondary | ICD-10-CM | POA: Diagnosis not present

## 2018-02-20 DIAGNOSIS — M170 Bilateral primary osteoarthritis of knee: Secondary | ICD-10-CM | POA: Diagnosis not present

## 2018-02-21 DIAGNOSIS — M170 Bilateral primary osteoarthritis of knee: Secondary | ICD-10-CM | POA: Diagnosis not present

## 2018-02-22 DIAGNOSIS — M170 Bilateral primary osteoarthritis of knee: Secondary | ICD-10-CM | POA: Diagnosis not present

## 2018-02-23 DIAGNOSIS — M170 Bilateral primary osteoarthritis of knee: Secondary | ICD-10-CM | POA: Diagnosis not present

## 2018-02-24 DIAGNOSIS — M170 Bilateral primary osteoarthritis of knee: Secondary | ICD-10-CM | POA: Diagnosis not present

## 2018-02-25 DIAGNOSIS — M170 Bilateral primary osteoarthritis of knee: Secondary | ICD-10-CM | POA: Diagnosis not present

## 2018-02-26 DIAGNOSIS — M170 Bilateral primary osteoarthritis of knee: Secondary | ICD-10-CM | POA: Diagnosis not present

## 2018-02-27 DIAGNOSIS — M170 Bilateral primary osteoarthritis of knee: Secondary | ICD-10-CM | POA: Diagnosis not present

## 2018-02-28 DIAGNOSIS — M170 Bilateral primary osteoarthritis of knee: Secondary | ICD-10-CM | POA: Diagnosis not present

## 2018-03-01 DIAGNOSIS — M170 Bilateral primary osteoarthritis of knee: Secondary | ICD-10-CM | POA: Diagnosis not present

## 2018-03-02 DIAGNOSIS — M170 Bilateral primary osteoarthritis of knee: Secondary | ICD-10-CM | POA: Diagnosis not present

## 2018-03-03 DIAGNOSIS — M170 Bilateral primary osteoarthritis of knee: Secondary | ICD-10-CM | POA: Diagnosis not present

## 2018-03-04 DIAGNOSIS — M170 Bilateral primary osteoarthritis of knee: Secondary | ICD-10-CM | POA: Diagnosis not present

## 2018-03-05 DIAGNOSIS — M170 Bilateral primary osteoarthritis of knee: Secondary | ICD-10-CM | POA: Diagnosis not present

## 2018-03-06 DIAGNOSIS — M170 Bilateral primary osteoarthritis of knee: Secondary | ICD-10-CM | POA: Diagnosis not present

## 2018-03-07 DIAGNOSIS — M170 Bilateral primary osteoarthritis of knee: Secondary | ICD-10-CM | POA: Diagnosis not present

## 2018-03-08 DIAGNOSIS — M170 Bilateral primary osteoarthritis of knee: Secondary | ICD-10-CM | POA: Diagnosis not present

## 2018-03-09 DIAGNOSIS — M170 Bilateral primary osteoarthritis of knee: Secondary | ICD-10-CM | POA: Diagnosis not present

## 2018-03-10 DIAGNOSIS — M170 Bilateral primary osteoarthritis of knee: Secondary | ICD-10-CM | POA: Diagnosis not present

## 2018-03-11 DIAGNOSIS — M170 Bilateral primary osteoarthritis of knee: Secondary | ICD-10-CM | POA: Diagnosis not present

## 2018-03-12 DIAGNOSIS — M170 Bilateral primary osteoarthritis of knee: Secondary | ICD-10-CM | POA: Diagnosis not present

## 2018-03-13 DIAGNOSIS — M170 Bilateral primary osteoarthritis of knee: Secondary | ICD-10-CM | POA: Diagnosis not present

## 2018-03-14 DIAGNOSIS — M170 Bilateral primary osteoarthritis of knee: Secondary | ICD-10-CM | POA: Diagnosis not present

## 2018-03-15 DIAGNOSIS — M170 Bilateral primary osteoarthritis of knee: Secondary | ICD-10-CM | POA: Diagnosis not present

## 2018-03-16 DIAGNOSIS — M170 Bilateral primary osteoarthritis of knee: Secondary | ICD-10-CM | POA: Diagnosis not present

## 2018-03-17 DIAGNOSIS — M170 Bilateral primary osteoarthritis of knee: Secondary | ICD-10-CM | POA: Diagnosis not present

## 2018-03-18 DIAGNOSIS — M170 Bilateral primary osteoarthritis of knee: Secondary | ICD-10-CM | POA: Diagnosis not present

## 2018-03-19 DIAGNOSIS — M170 Bilateral primary osteoarthritis of knee: Secondary | ICD-10-CM | POA: Diagnosis not present

## 2018-03-20 DIAGNOSIS — M170 Bilateral primary osteoarthritis of knee: Secondary | ICD-10-CM | POA: Diagnosis not present

## 2018-03-21 DIAGNOSIS — M170 Bilateral primary osteoarthritis of knee: Secondary | ICD-10-CM | POA: Diagnosis not present

## 2018-03-22 DIAGNOSIS — M170 Bilateral primary osteoarthritis of knee: Secondary | ICD-10-CM | POA: Diagnosis not present

## 2018-03-23 DIAGNOSIS — M170 Bilateral primary osteoarthritis of knee: Secondary | ICD-10-CM | POA: Diagnosis not present

## 2018-03-24 DIAGNOSIS — M170 Bilateral primary osteoarthritis of knee: Secondary | ICD-10-CM | POA: Diagnosis not present

## 2018-03-25 DIAGNOSIS — M170 Bilateral primary osteoarthritis of knee: Secondary | ICD-10-CM | POA: Diagnosis not present

## 2018-03-26 DIAGNOSIS — M170 Bilateral primary osteoarthritis of knee: Secondary | ICD-10-CM | POA: Diagnosis not present

## 2018-03-27 DIAGNOSIS — M170 Bilateral primary osteoarthritis of knee: Secondary | ICD-10-CM | POA: Diagnosis not present

## 2018-03-28 DIAGNOSIS — M170 Bilateral primary osteoarthritis of knee: Secondary | ICD-10-CM | POA: Diagnosis not present

## 2018-03-29 DIAGNOSIS — M170 Bilateral primary osteoarthritis of knee: Secondary | ICD-10-CM | POA: Diagnosis not present

## 2018-03-30 DIAGNOSIS — M170 Bilateral primary osteoarthritis of knee: Secondary | ICD-10-CM | POA: Diagnosis not present

## 2018-03-31 DIAGNOSIS — M170 Bilateral primary osteoarthritis of knee: Secondary | ICD-10-CM | POA: Diagnosis not present

## 2018-04-01 DIAGNOSIS — M170 Bilateral primary osteoarthritis of knee: Secondary | ICD-10-CM | POA: Diagnosis not present

## 2018-04-02 DIAGNOSIS — M170 Bilateral primary osteoarthritis of knee: Secondary | ICD-10-CM | POA: Diagnosis not present

## 2018-04-03 DIAGNOSIS — M170 Bilateral primary osteoarthritis of knee: Secondary | ICD-10-CM | POA: Diagnosis not present

## 2018-04-04 DIAGNOSIS — M170 Bilateral primary osteoarthritis of knee: Secondary | ICD-10-CM | POA: Diagnosis not present

## 2018-04-05 DIAGNOSIS — M170 Bilateral primary osteoarthritis of knee: Secondary | ICD-10-CM | POA: Diagnosis not present

## 2018-04-06 DIAGNOSIS — M170 Bilateral primary osteoarthritis of knee: Secondary | ICD-10-CM | POA: Diagnosis not present

## 2018-04-07 DIAGNOSIS — M170 Bilateral primary osteoarthritis of knee: Secondary | ICD-10-CM | POA: Diagnosis not present

## 2018-04-08 DIAGNOSIS — M170 Bilateral primary osteoarthritis of knee: Secondary | ICD-10-CM | POA: Diagnosis not present

## 2018-04-09 DIAGNOSIS — M170 Bilateral primary osteoarthritis of knee: Secondary | ICD-10-CM | POA: Diagnosis not present

## 2018-04-10 DIAGNOSIS — M170 Bilateral primary osteoarthritis of knee: Secondary | ICD-10-CM | POA: Diagnosis not present

## 2018-04-11 DIAGNOSIS — M170 Bilateral primary osteoarthritis of knee: Secondary | ICD-10-CM | POA: Diagnosis not present

## 2018-04-12 DIAGNOSIS — M170 Bilateral primary osteoarthritis of knee: Secondary | ICD-10-CM | POA: Diagnosis not present

## 2018-04-13 DIAGNOSIS — M170 Bilateral primary osteoarthritis of knee: Secondary | ICD-10-CM | POA: Diagnosis not present

## 2018-04-14 DIAGNOSIS — M170 Bilateral primary osteoarthritis of knee: Secondary | ICD-10-CM | POA: Diagnosis not present

## 2018-04-15 DIAGNOSIS — M170 Bilateral primary osteoarthritis of knee: Secondary | ICD-10-CM | POA: Diagnosis not present

## 2018-04-16 DIAGNOSIS — M170 Bilateral primary osteoarthritis of knee: Secondary | ICD-10-CM | POA: Diagnosis not present

## 2018-04-17 DIAGNOSIS — M170 Bilateral primary osteoarthritis of knee: Secondary | ICD-10-CM | POA: Diagnosis not present

## 2018-04-18 DIAGNOSIS — M170 Bilateral primary osteoarthritis of knee: Secondary | ICD-10-CM | POA: Diagnosis not present

## 2018-04-19 DIAGNOSIS — M170 Bilateral primary osteoarthritis of knee: Secondary | ICD-10-CM | POA: Diagnosis not present

## 2018-04-20 DIAGNOSIS — M170 Bilateral primary osteoarthritis of knee: Secondary | ICD-10-CM | POA: Diagnosis not present

## 2018-04-21 DIAGNOSIS — M170 Bilateral primary osteoarthritis of knee: Secondary | ICD-10-CM | POA: Diagnosis not present

## 2018-04-22 DIAGNOSIS — M170 Bilateral primary osteoarthritis of knee: Secondary | ICD-10-CM | POA: Diagnosis not present

## 2018-04-23 DIAGNOSIS — M170 Bilateral primary osteoarthritis of knee: Secondary | ICD-10-CM | POA: Diagnosis not present

## 2018-04-24 DIAGNOSIS — M170 Bilateral primary osteoarthritis of knee: Secondary | ICD-10-CM | POA: Diagnosis not present

## 2018-04-25 DIAGNOSIS — M170 Bilateral primary osteoarthritis of knee: Secondary | ICD-10-CM | POA: Diagnosis not present

## 2018-04-26 ENCOUNTER — Emergency Department: Payer: Medicaid Other

## 2018-04-26 ENCOUNTER — Other Ambulatory Visit: Payer: Self-pay

## 2018-04-26 ENCOUNTER — Encounter: Payer: Self-pay | Admitting: Emergency Medicine

## 2018-04-26 ENCOUNTER — Emergency Department
Admission: EM | Admit: 2018-04-26 | Discharge: 2018-04-26 | Disposition: A | Discharge status: Home / Self Care | Payer: Medicaid Other | Attending: Emergency Medicine | Admitting: Emergency Medicine

## 2018-04-26 DIAGNOSIS — M170 Bilateral primary osteoarthritis of knee: Secondary | ICD-10-CM | POA: Diagnosis not present

## 2018-04-26 DIAGNOSIS — Z7982 Long term (current) use of aspirin: Secondary | ICD-10-CM | POA: Diagnosis not present

## 2018-04-26 DIAGNOSIS — J9801 Acute bronchospasm: Secondary | ICD-10-CM | POA: Insufficient documentation

## 2018-04-26 DIAGNOSIS — R0602 Shortness of breath: Secondary | ICD-10-CM

## 2018-04-26 DIAGNOSIS — Z79899 Other long term (current) drug therapy: Secondary | ICD-10-CM | POA: Insufficient documentation

## 2018-04-26 DIAGNOSIS — Z86718 Personal history of other venous thrombosis and embolism: Secondary | ICD-10-CM | POA: Insufficient documentation

## 2018-04-26 DIAGNOSIS — I10 Essential (primary) hypertension: Secondary | ICD-10-CM | POA: Insufficient documentation

## 2018-04-26 DIAGNOSIS — Z87891 Personal history of nicotine dependence: Secondary | ICD-10-CM | POA: Insufficient documentation

## 2018-04-26 DIAGNOSIS — R079 Chest pain, unspecified: Secondary | ICD-10-CM | POA: Diagnosis not present

## 2018-04-26 LAB — CBC
HCT: 45 % (ref 36.0–46.0)
Hemoglobin: 14.5 g/dL (ref 12.0–15.0)
MCH: 31.5 pg (ref 26.0–34.0)
MCHC: 32.2 g/dL (ref 30.0–36.0)
MCV: 97.8 fL (ref 80.0–100.0)
NRBC: 0 % (ref 0.0–0.2)
PLATELETS: 217 10*3/uL (ref 150–400)
RBC: 4.6 MIL/uL (ref 3.87–5.11)
RDW: 12.6 % (ref 11.5–15.5)
WBC: 6.4 10*3/uL (ref 4.0–10.5)

## 2018-04-26 LAB — TROPONIN I

## 2018-04-26 LAB — BASIC METABOLIC PANEL
Anion gap: 9 (ref 5–15)
BUN: 17 mg/dL (ref 6–20)
CALCIUM: 9.1 mg/dL (ref 8.9–10.3)
CHLORIDE: 104 mmol/L (ref 98–111)
CO2: 24 mmol/L (ref 22–32)
Creatinine, Ser: 1.07 mg/dL — ABNORMAL HIGH (ref 0.44–1.00)
GFR calc Af Amer: >60 mL/min (ref >60)
GLUCOSE: 94 mg/dL (ref 70–99)
POTASSIUM: 3.5 mmol/L (ref 3.5–5.1)
Sodium: 137 mmol/L (ref 135–145)

## 2018-04-26 LAB — APTT: APTT: 26 s (ref 24–36)

## 2018-04-26 LAB — PROTIME-INR
INR: 0.94
PROTHROMBIN TIME: 12.5 s (ref 11.4–15.2)

## 2018-04-26 MED ORDER — KETOROLAC TROMETHAMINE 30 MG/ML IJ SOLN
30.0000 mg | Freq: Once | INTRAMUSCULAR | Status: AC
  Administered 2018-04-26: 30 mg via INTRAVENOUS
  Filled 2018-04-26: qty 1

## 2018-04-26 MED ORDER — IPRATROPIUM-ALBUTEROL 0.5-2.5 (3) MG/3ML IN SOLN
RESPIRATORY_TRACT | Status: AC
  Filled 2018-04-26: qty 3

## 2018-04-26 MED ORDER — IPRATROPIUM-ALBUTEROL 0.5-2.5 (3) MG/3ML IN SOLN
3.0000 mL | Freq: Once | RESPIRATORY_TRACT | Status: AC
  Administered 2018-04-26: 3 mL via RESPIRATORY_TRACT

## 2018-04-26 MED ORDER — IOPAMIDOL (ISOVUE-370) INJECTION 76%
75.0000 mL | Freq: Once | INTRAVENOUS | Status: AC | PRN
  Administered 2018-04-26: 100 mL via INTRAVENOUS

## 2018-04-26 NOTE — ED Triage Notes (Signed)
Pt to ED via POV c/o shortness of breath and right sided chest pain. Pt states that she has been short of breath x 2 days. Pt states that chest pain is worse with breathing. Pt has hx/o PE and states that this feels similar to the last time she had a PE. Pt is in NAD at this time.

## 2018-04-26 NOTE — ED Provider Notes (Signed)
[facility] Emergency Department Provider Note   ____________________________________________    I have reviewed the triage vital signs and the nursing notes.   HISTORY  Chief Complaint Shortness of Breath and Chest Pain     HPI Diane Castillo is a 47 y.o. female who presents with complaints of shortness of breath  X2 days.  She also reports right-sided chest discomfort for nearly a week.  She does report a history of a PE in the past.  She is not currently on anticoagulation.  She denies fevers or chills.  Did have a cold 2 weeks ago she is recovered from.  Denies leg pain or swelling.  No recent travel.  No nausea vomiting diaphoresis   Past Medical History:  Diagnosis Date  . Asthma   . DVT (deep venous thrombosis) (HCC)    right leg  . Hypertension   . Immune deficiency disorder [facility])     Patient Active Problem List   Diagnosis Date Noted  . Primary localized osteoarthritis of right knee 10/30/2016  . Right leg pain 11/14/2015  . Gastrocnemius muscle strain 11/14/2015  . Hypokalemia 11/14/2015  . Metabolic acidosis 11/14/2015  . Hyperglycemia 11/14/2015  . Drug abuse (HCC) 11/14/2015  . Rhabdomyolysis 11/13/2015    Past Surgical History:  Procedure Laterality Date  . ABDOMINAL HYSTERECTOMY    . CHOLECYSTECTOMY    . HAND SURGERY    . HERNIA REPAIR  2014   ventral  . KNEE CLOSED REDUCTION Right 12/06/2016   Procedure: CLOSED MANIPULATION KNEE;  Surgeon: Kennedy Bucker, MD;  Location: [facility] ORS;  Service: Orthopedics;  Laterality: Right;  . KNEE SURGERY    . ORIF FINGER FRACTURE Left    middle finger, metal placed  . TUBAL LIGATION      Prior to Admission medications   Medication Sig Start Date End Date Taking? Authorizing Provider  acetaminophen (TYLENOL) 500 MG tablet Take 1,000 mg by mouth daily as needed for moderate pain or headache.    [provider]  albuterol (PROVENTIL HFA) 108 (90 Base) MCG/ACT inhaler Inhale 2  puffs into the lungs every 6 (six) hours as needed for wheezing or shortness of breath.  10/03/15   [provider]  aspirin EC 81 MG tablet Take 81 mg by mouth daily.     [provider]  Aspirin-Salicylamide-Caffeine (ARTHRITIS STRENGTH BC POWDER PO) Take 1 packet by mouth daily as needed (pain).    [provider]  diphenhydrAMINE (BENADRYL) 25 MG tablet Take 25 mg by mouth daily as needed for allergies.    [provider]  docusate sodium (COLACE) 100 MG capsule Take 1 capsule (100 mg total) by mouth 2 (two) times daily. Patient taking differently: Take 100 mg by mouth daily.  11/14/15   Katharina Caper, MD  dolutegravir (TIVICAY) 50 MG tablet Take 50 mg by mouth daily. 05/16/15   [provider]  emtricitabine-tenofovir AF (DESCOVY) 200-25 MG tablet Take 1 tablet by mouth daily.    [provider]  enalapril (VASOTEC) 10 MG tablet Take 10 mg by mouth daily.    [provider]  HYDROcodone-acetaminophen (NORCO/VICODIN) 5-325 MG tablet Take 1 tablet by mouth every 8 (eight) hours as needed for moderate pain. 06/01/17   Tommi Rumps, PA-C  ketorolac (TORADOL) 10 MG tablet Take 1 tablet (10 mg total) by mouth every 8 (eight) hours as needed for moderate pain. 06/01/17   Tommi Rumps, PA-C  Lidocaine 0.5 % GEL Apply  1 application topically 3 (three) times daily. 09/28/17   Enid Derry, PA-C  Menthol, Topical Analgesic, (ICY HOT EX) Apply 1 application topically daily as needed (pain).    [provider]  oxyCODONE-acetaminophen (PERCOCET/ROXICET) 5-325 MG tablet Take 1 tablet by mouth every 6 (six) hours as needed for severe pain. 07/13/17   Joni Reining, PA-C  valACYclovir (VALTREX) 500 MG tablet Take 500 mg by mouth 2 (two) times daily. 10/03/15   [provider]     Allergies Penicillins and Tramadol  Family History  Problem Relation Age of Onset  . Heart disease Mother     Social History Social  History   Tobacco Use  . Smoking status: Former Smoker    Packs/day: 0.25    Types: Cigarettes    Last attempt to quit: 09/15/2016    Years since quitting: 1.6  . Smokeless tobacco: Never Used  Substance Use Topics  . Alcohol use: No  . Drug use: Yes    Types: Marijuana    Comment: daily mj - hx of cocaine use 2 years ago    Review of Systems  Constitutional: No fever/chills Eyes: No visual changes.  ENT: No sore throat. Cardiovascular: As above Respiratory: As above Gastrointestinal: No abdominal pain.  No nausea, no vomiting.   Genitourinary: Negative for dysuria. Musculoskeletal: Negative for back pain. Skin: Negative for rash. Neurological: Negative for headaches or weakness   ____________________________________________   PHYSICAL EXAM:  VITAL SIGNS: ED Triage Vitals  Enc Vitals Group     BP 04/26/18 1831 (!) 155/79     Pulse Rate 04/26/18 1831 (!) 56     Resp 04/26/18 1831 18     Temp 04/26/18 1831 98.2 F (36.8 C)     Temp Source 04/26/18 1831 Oral     SpO2 04/26/18 1831 98 %     Weight 04/26/18 1816 84.4 kg (186 lb)     Height 04/26/18 1816 1.651 m (5\' 5" )     Head Circumference --      Peak Flow --      Pain Score 04/26/18 1816 9     Pain Loc --      Pain Edu? --      Excl. in GC? --     Constitutional: Alert and oriented.   Head: Atraumatic.  Mouth/Throat: Mucous membranes are moist.    Cardiovascular: Normal rate, regular rhythm. Grossly normal heart sounds.  Good peripheral circulation. Respiratory: Normal respiratory effort.  No retractions. Lungs CTAB.  No wheezing Gastrointestinal: Soft and nontender. No distention.  No CVA tenderness. Genitourinary: deferred Musculoskeletal: No lower extremity tenderness nor edema.  Warm and well perfused Neurologic:  Normal speech and language. No gross focal neurologic deficits are appreciated.  Skin:  Skin is warm, dry and intact. No rash noted. Psychiatric: Mood and affect are normal. Speech and  behavior are normal.  ____________________________________________   LABS (all labs ordered are listed, but only abnormal results are displayed)  Labs Reviewed  BASIC METABOLIC PANEL - Abnormal; Notable for the following components:      Result Value   Creatinine, Ser 1.07 (*)    All other components within normal limits  CBC  TROPONIN I  PROTIME-INR  APTT   ____________________________________________  EKG  ED ECG REPORT I, Jene Every, the attending physician, personally viewed and interpreted this ECG.  Date: 04/26/2018  Rhythm: Sinus bradycardia QRS Axis: normal Intervals: normal ST/T Wave abnormalities: normal Narrative Interpretation: no evidence of acute ischemia  ____________________________________________  RADIOLOGY  Chest x-ray normal ____________________________________________   PROCEDURES  Procedure(s) performed: No  Procedures   Critical Care performed: No ____________________________________________   INITIAL IMPRESSION / ASSESSMENT AND PLAN / ED COURSE  Pertinent labs & imaging results that were available during my care of the patient were reviewed by me and considered in my medical decision making (see chart for details).  Patient presents with shortness of breath, she describes a sensation that she cannot take a deep breath.  She reports some pain in her right lower chest which is been ongoing for at least a week, it seems like this may have developed after a bad cold that she had.  However she has a history of PE in the past.  Not anticoagulated at this time, will obtain CTA to rule out pulmonary embolism  CT angiography is negative for PE.  Patient reports she is feeling sniffily better, will give a DuoNeb for symptom relief.  Appropriate for discharge with outpatient follow-up.  Return precautions discussed.    ____________________________________________   FINAL CLINICAL IMPRESSION(S) / ED DIAGNOSES  Final diagnoses:    Shortness of breath  Bronchospasm        Note:  This document was prepared using Dragon voice recognition software and may include unintentional dictation errors.     Jene Every, MD 04/26/18 [phone]

## 2018-04-27 DIAGNOSIS — M170 Bilateral primary osteoarthritis of knee: Secondary | ICD-10-CM | POA: Diagnosis not present

## 2018-04-28 DIAGNOSIS — M170 Bilateral primary osteoarthritis of knee: Secondary | ICD-10-CM | POA: Diagnosis not present

## 2018-04-29 DIAGNOSIS — M170 Bilateral primary osteoarthritis of knee: Secondary | ICD-10-CM | POA: Diagnosis not present

## 2018-04-30 DIAGNOSIS — M170 Bilateral primary osteoarthritis of knee: Secondary | ICD-10-CM | POA: Diagnosis not present

## 2018-05-01 DIAGNOSIS — M170 Bilateral primary osteoarthritis of knee: Secondary | ICD-10-CM | POA: Diagnosis not present

## 2018-05-02 DIAGNOSIS — M170 Bilateral primary osteoarthritis of knee: Secondary | ICD-10-CM | POA: Diagnosis not present

## 2018-05-03 DIAGNOSIS — M170 Bilateral primary osteoarthritis of knee: Secondary | ICD-10-CM | POA: Diagnosis not present

## 2018-05-04 DIAGNOSIS — M170 Bilateral primary osteoarthritis of knee: Secondary | ICD-10-CM | POA: Diagnosis not present

## 2018-05-05 DIAGNOSIS — M170 Bilateral primary osteoarthritis of knee: Secondary | ICD-10-CM | POA: Diagnosis not present

## 2018-05-06 DIAGNOSIS — M170 Bilateral primary osteoarthritis of knee: Secondary | ICD-10-CM | POA: Diagnosis not present

## 2018-05-07 DIAGNOSIS — M170 Bilateral primary osteoarthritis of knee: Secondary | ICD-10-CM | POA: Diagnosis not present

## 2018-05-08 DIAGNOSIS — M170 Bilateral primary osteoarthritis of knee: Secondary | ICD-10-CM | POA: Diagnosis not present

## 2018-05-09 DIAGNOSIS — M170 Bilateral primary osteoarthritis of knee: Secondary | ICD-10-CM | POA: Diagnosis not present

## 2018-05-10 DIAGNOSIS — M170 Bilateral primary osteoarthritis of knee: Secondary | ICD-10-CM | POA: Diagnosis not present

## 2018-05-11 DIAGNOSIS — M170 Bilateral primary osteoarthritis of knee: Secondary | ICD-10-CM | POA: Diagnosis not present

## 2018-05-12 DIAGNOSIS — M170 Bilateral primary osteoarthritis of knee: Secondary | ICD-10-CM | POA: Diagnosis not present

## 2018-05-13 DIAGNOSIS — M170 Bilateral primary osteoarthritis of knee: Secondary | ICD-10-CM | POA: Diagnosis not present

## 2018-05-14 DIAGNOSIS — M170 Bilateral primary osteoarthritis of knee: Secondary | ICD-10-CM | POA: Diagnosis not present

## 2018-05-15 DIAGNOSIS — M170 Bilateral primary osteoarthritis of knee: Secondary | ICD-10-CM | POA: Diagnosis not present

## 2018-05-16 DIAGNOSIS — M170 Bilateral primary osteoarthritis of knee: Secondary | ICD-10-CM | POA: Diagnosis not present

## 2018-05-17 DIAGNOSIS — M170 Bilateral primary osteoarthritis of knee: Secondary | ICD-10-CM | POA: Diagnosis not present

## 2018-05-18 DIAGNOSIS — M170 Bilateral primary osteoarthritis of knee: Secondary | ICD-10-CM | POA: Diagnosis not present

## 2018-06-23 DIAGNOSIS — Z23 Encounter for immunization: Secondary | ICD-10-CM | POA: Diagnosis not present

## 2018-06-23 DIAGNOSIS — B182 Chronic viral hepatitis C: Secondary | ICD-10-CM | POA: Diagnosis not present

## 2018-06-23 DIAGNOSIS — Z21 Asymptomatic human immunodeficiency virus [HIV] infection status: Secondary | ICD-10-CM | POA: Diagnosis not present

## 2018-06-23 DIAGNOSIS — K029 Dental caries, unspecified: Secondary | ICD-10-CM | POA: Diagnosis not present

## 2018-07-14 DIAGNOSIS — M170 Bilateral primary osteoarthritis of knee: Secondary | ICD-10-CM | POA: Diagnosis not present

## 2018-07-15 DIAGNOSIS — M170 Bilateral primary osteoarthritis of knee: Secondary | ICD-10-CM | POA: Diagnosis not present

## 2018-07-16 DIAGNOSIS — M170 Bilateral primary osteoarthritis of knee: Secondary | ICD-10-CM | POA: Diagnosis not present

## 2018-07-17 DIAGNOSIS — M170 Bilateral primary osteoarthritis of knee: Secondary | ICD-10-CM | POA: Diagnosis not present

## 2018-07-18 DIAGNOSIS — M170 Bilateral primary osteoarthritis of knee: Secondary | ICD-10-CM | POA: Diagnosis not present

## 2018-07-19 DIAGNOSIS — M170 Bilateral primary osteoarthritis of knee: Secondary | ICD-10-CM | POA: Diagnosis not present

## 2018-07-20 DIAGNOSIS — M170 Bilateral primary osteoarthritis of knee: Secondary | ICD-10-CM | POA: Diagnosis not present

## 2018-07-21 DIAGNOSIS — M170 Bilateral primary osteoarthritis of knee: Secondary | ICD-10-CM | POA: Diagnosis not present

## 2018-07-22 DIAGNOSIS — M170 Bilateral primary osteoarthritis of knee: Secondary | ICD-10-CM | POA: Diagnosis not present

## 2018-07-23 DIAGNOSIS — M170 Bilateral primary osteoarthritis of knee: Secondary | ICD-10-CM | POA: Diagnosis not present

## 2018-07-24 DIAGNOSIS — M170 Bilateral primary osteoarthritis of knee: Secondary | ICD-10-CM | POA: Diagnosis not present

## 2018-07-25 DIAGNOSIS — M170 Bilateral primary osteoarthritis of knee: Secondary | ICD-10-CM | POA: Diagnosis not present

## 2018-07-26 DIAGNOSIS — M170 Bilateral primary osteoarthritis of knee: Secondary | ICD-10-CM | POA: Diagnosis not present

## 2018-07-27 DIAGNOSIS — M170 Bilateral primary osteoarthritis of knee: Secondary | ICD-10-CM | POA: Diagnosis not present

## 2018-07-28 DIAGNOSIS — M170 Bilateral primary osteoarthritis of knee: Secondary | ICD-10-CM | POA: Diagnosis not present

## 2018-07-29 DIAGNOSIS — M170 Bilateral primary osteoarthritis of knee: Secondary | ICD-10-CM | POA: Diagnosis not present

## 2018-07-30 DIAGNOSIS — M170 Bilateral primary osteoarthritis of knee: Secondary | ICD-10-CM | POA: Diagnosis not present

## 2018-07-31 DIAGNOSIS — M170 Bilateral primary osteoarthritis of knee: Secondary | ICD-10-CM | POA: Diagnosis not present

## 2018-08-01 DIAGNOSIS — M170 Bilateral primary osteoarthritis of knee: Secondary | ICD-10-CM | POA: Diagnosis not present

## 2018-08-02 DIAGNOSIS — M170 Bilateral primary osteoarthritis of knee: Secondary | ICD-10-CM | POA: Diagnosis not present

## 2018-08-03 DIAGNOSIS — M170 Bilateral primary osteoarthritis of knee: Secondary | ICD-10-CM | POA: Diagnosis not present

## 2018-08-04 DIAGNOSIS — M170 Bilateral primary osteoarthritis of knee: Secondary | ICD-10-CM | POA: Diagnosis not present

## 2018-08-05 DIAGNOSIS — M170 Bilateral primary osteoarthritis of knee: Secondary | ICD-10-CM | POA: Diagnosis not present

## 2018-08-06 DIAGNOSIS — M170 Bilateral primary osteoarthritis of knee: Secondary | ICD-10-CM | POA: Diagnosis not present

## 2018-08-07 DIAGNOSIS — M170 Bilateral primary osteoarthritis of knee: Secondary | ICD-10-CM | POA: Diagnosis not present

## 2018-08-08 DIAGNOSIS — M170 Bilateral primary osteoarthritis of knee: Secondary | ICD-10-CM | POA: Diagnosis not present

## 2018-08-09 DIAGNOSIS — M170 Bilateral primary osteoarthritis of knee: Secondary | ICD-10-CM | POA: Diagnosis not present

## 2018-08-10 DIAGNOSIS — M170 Bilateral primary osteoarthritis of knee: Secondary | ICD-10-CM | POA: Diagnosis not present

## 2018-08-11 DIAGNOSIS — M170 Bilateral primary osteoarthritis of knee: Secondary | ICD-10-CM | POA: Diagnosis not present

## 2018-08-12 DIAGNOSIS — M170 Bilateral primary osteoarthritis of knee: Secondary | ICD-10-CM | POA: Diagnosis not present

## 2018-08-13 DIAGNOSIS — M170 Bilateral primary osteoarthritis of knee: Secondary | ICD-10-CM | POA: Diagnosis not present

## 2018-08-14 DIAGNOSIS — M170 Bilateral primary osteoarthritis of knee: Secondary | ICD-10-CM | POA: Diagnosis not present

## 2018-08-15 DIAGNOSIS — M170 Bilateral primary osteoarthritis of knee: Secondary | ICD-10-CM | POA: Diagnosis not present

## 2018-08-16 DIAGNOSIS — M170 Bilateral primary osteoarthritis of knee: Secondary | ICD-10-CM | POA: Diagnosis not present

## 2018-08-17 DIAGNOSIS — M170 Bilateral primary osteoarthritis of knee: Secondary | ICD-10-CM | POA: Diagnosis not present

## 2018-08-18 DIAGNOSIS — M170 Bilateral primary osteoarthritis of knee: Secondary | ICD-10-CM | POA: Diagnosis not present

## 2018-08-19 DIAGNOSIS — M170 Bilateral primary osteoarthritis of knee: Secondary | ICD-10-CM | POA: Diagnosis not present

## 2018-08-20 DIAGNOSIS — M170 Bilateral primary osteoarthritis of knee: Secondary | ICD-10-CM | POA: Diagnosis not present

## 2018-08-21 DIAGNOSIS — M170 Bilateral primary osteoarthritis of knee: Secondary | ICD-10-CM | POA: Diagnosis not present

## 2018-08-22 DIAGNOSIS — M170 Bilateral primary osteoarthritis of knee: Secondary | ICD-10-CM | POA: Diagnosis not present

## 2018-08-23 DIAGNOSIS — M170 Bilateral primary osteoarthritis of knee: Secondary | ICD-10-CM | POA: Diagnosis not present

## 2018-08-24 DIAGNOSIS — M170 Bilateral primary osteoarthritis of knee: Secondary | ICD-10-CM | POA: Diagnosis not present

## 2018-08-25 DIAGNOSIS — M170 Bilateral primary osteoarthritis of knee: Secondary | ICD-10-CM | POA: Diagnosis not present

## 2018-08-26 DIAGNOSIS — M170 Bilateral primary osteoarthritis of knee: Secondary | ICD-10-CM | POA: Diagnosis not present

## 2018-08-27 DIAGNOSIS — M170 Bilateral primary osteoarthritis of knee: Secondary | ICD-10-CM | POA: Diagnosis not present

## 2018-08-28 DIAGNOSIS — M170 Bilateral primary osteoarthritis of knee: Secondary | ICD-10-CM | POA: Diagnosis not present

## 2018-08-29 DIAGNOSIS — M170 Bilateral primary osteoarthritis of knee: Secondary | ICD-10-CM | POA: Diagnosis not present

## 2018-08-30 DIAGNOSIS — M170 Bilateral primary osteoarthritis of knee: Secondary | ICD-10-CM | POA: Diagnosis not present

## 2018-08-31 DIAGNOSIS — M170 Bilateral primary osteoarthritis of knee: Secondary | ICD-10-CM | POA: Diagnosis not present

## 2018-09-01 DIAGNOSIS — M170 Bilateral primary osteoarthritis of knee: Secondary | ICD-10-CM | POA: Diagnosis not present

## 2018-09-02 DIAGNOSIS — M170 Bilateral primary osteoarthritis of knee: Secondary | ICD-10-CM | POA: Diagnosis not present

## 2018-09-03 DIAGNOSIS — M170 Bilateral primary osteoarthritis of knee: Secondary | ICD-10-CM | POA: Diagnosis not present

## 2018-09-04 DIAGNOSIS — M170 Bilateral primary osteoarthritis of knee: Secondary | ICD-10-CM | POA: Diagnosis not present

## 2018-09-05 DIAGNOSIS — M170 Bilateral primary osteoarthritis of knee: Secondary | ICD-10-CM | POA: Diagnosis not present

## 2018-09-06 DIAGNOSIS — M170 Bilateral primary osteoarthritis of knee: Secondary | ICD-10-CM | POA: Diagnosis not present

## 2018-09-07 DIAGNOSIS — M170 Bilateral primary osteoarthritis of knee: Secondary | ICD-10-CM | POA: Diagnosis not present

## 2018-09-08 DIAGNOSIS — M170 Bilateral primary osteoarthritis of knee: Secondary | ICD-10-CM | POA: Diagnosis not present

## 2018-09-09 DIAGNOSIS — M170 Bilateral primary osteoarthritis of knee: Secondary | ICD-10-CM | POA: Diagnosis not present

## 2018-09-10 DIAGNOSIS — M170 Bilateral primary osteoarthritis of knee: Secondary | ICD-10-CM | POA: Diagnosis not present

## 2018-09-11 DIAGNOSIS — M170 Bilateral primary osteoarthritis of knee: Secondary | ICD-10-CM | POA: Diagnosis not present

## 2018-09-12 DIAGNOSIS — M170 Bilateral primary osteoarthritis of knee: Secondary | ICD-10-CM | POA: Diagnosis not present

## 2018-09-13 DIAGNOSIS — M170 Bilateral primary osteoarthritis of knee: Secondary | ICD-10-CM | POA: Diagnosis not present

## 2018-09-14 DIAGNOSIS — M170 Bilateral primary osteoarthritis of knee: Secondary | ICD-10-CM | POA: Diagnosis not present

## 2018-09-15 DIAGNOSIS — M170 Bilateral primary osteoarthritis of knee: Secondary | ICD-10-CM | POA: Diagnosis not present

## 2018-09-16 DIAGNOSIS — M170 Bilateral primary osteoarthritis of knee: Secondary | ICD-10-CM | POA: Diagnosis not present

## 2018-09-17 DIAGNOSIS — M170 Bilateral primary osteoarthritis of knee: Secondary | ICD-10-CM | POA: Diagnosis not present

## 2018-09-18 DIAGNOSIS — M170 Bilateral primary osteoarthritis of knee: Secondary | ICD-10-CM | POA: Diagnosis not present

## 2018-09-19 DIAGNOSIS — M170 Bilateral primary osteoarthritis of knee: Secondary | ICD-10-CM | POA: Diagnosis not present

## 2018-09-20 DIAGNOSIS — M170 Bilateral primary osteoarthritis of knee: Secondary | ICD-10-CM | POA: Diagnosis not present

## 2018-09-21 DIAGNOSIS — M170 Bilateral primary osteoarthritis of knee: Secondary | ICD-10-CM | POA: Diagnosis not present

## 2018-09-22 DIAGNOSIS — M170 Bilateral primary osteoarthritis of knee: Secondary | ICD-10-CM | POA: Diagnosis not present

## 2018-09-23 DIAGNOSIS — M170 Bilateral primary osteoarthritis of knee: Secondary | ICD-10-CM | POA: Diagnosis not present

## 2018-09-24 DIAGNOSIS — M170 Bilateral primary osteoarthritis of knee: Secondary | ICD-10-CM | POA: Diagnosis not present

## 2018-09-25 DIAGNOSIS — M170 Bilateral primary osteoarthritis of knee: Secondary | ICD-10-CM | POA: Diagnosis not present

## 2018-09-26 DIAGNOSIS — M170 Bilateral primary osteoarthritis of knee: Secondary | ICD-10-CM | POA: Diagnosis not present

## 2018-09-27 DIAGNOSIS — M170 Bilateral primary osteoarthritis of knee: Secondary | ICD-10-CM | POA: Diagnosis not present

## 2018-09-28 DIAGNOSIS — M170 Bilateral primary osteoarthritis of knee: Secondary | ICD-10-CM | POA: Diagnosis not present

## 2018-09-29 DIAGNOSIS — M170 Bilateral primary osteoarthritis of knee: Secondary | ICD-10-CM | POA: Diagnosis not present

## 2018-09-30 DIAGNOSIS — M170 Bilateral primary osteoarthritis of knee: Secondary | ICD-10-CM | POA: Diagnosis not present

## 2018-10-01 DIAGNOSIS — M170 Bilateral primary osteoarthritis of knee: Secondary | ICD-10-CM | POA: Diagnosis not present

## 2018-10-02 DIAGNOSIS — M170 Bilateral primary osteoarthritis of knee: Secondary | ICD-10-CM | POA: Diagnosis not present

## 2018-10-03 DIAGNOSIS — M170 Bilateral primary osteoarthritis of knee: Secondary | ICD-10-CM | POA: Diagnosis not present

## 2018-10-04 DIAGNOSIS — M170 Bilateral primary osteoarthritis of knee: Secondary | ICD-10-CM | POA: Diagnosis not present

## 2018-10-05 DIAGNOSIS — M170 Bilateral primary osteoarthritis of knee: Secondary | ICD-10-CM | POA: Diagnosis not present

## 2018-10-06 DIAGNOSIS — M170 Bilateral primary osteoarthritis of knee: Secondary | ICD-10-CM | POA: Diagnosis not present

## 2018-10-07 DIAGNOSIS — M170 Bilateral primary osteoarthritis of knee: Secondary | ICD-10-CM | POA: Diagnosis not present

## 2018-10-08 DIAGNOSIS — M170 Bilateral primary osteoarthritis of knee: Secondary | ICD-10-CM | POA: Diagnosis not present

## 2018-10-09 DIAGNOSIS — M170 Bilateral primary osteoarthritis of knee: Secondary | ICD-10-CM | POA: Diagnosis not present

## 2018-10-10 DIAGNOSIS — M170 Bilateral primary osteoarthritis of knee: Secondary | ICD-10-CM | POA: Diagnosis not present

## 2018-10-11 DIAGNOSIS — M170 Bilateral primary osteoarthritis of knee: Secondary | ICD-10-CM | POA: Diagnosis not present

## 2018-10-12 DIAGNOSIS — M170 Bilateral primary osteoarthritis of knee: Secondary | ICD-10-CM | POA: Diagnosis not present

## 2018-10-13 DIAGNOSIS — M170 Bilateral primary osteoarthritis of knee: Secondary | ICD-10-CM | POA: Diagnosis not present

## 2018-10-14 DIAGNOSIS — M170 Bilateral primary osteoarthritis of knee: Secondary | ICD-10-CM | POA: Diagnosis not present

## 2018-10-15 DIAGNOSIS — M170 Bilateral primary osteoarthritis of knee: Secondary | ICD-10-CM | POA: Diagnosis not present

## 2018-10-16 DIAGNOSIS — M170 Bilateral primary osteoarthritis of knee: Secondary | ICD-10-CM | POA: Diagnosis not present

## 2018-10-17 DIAGNOSIS — M170 Bilateral primary osteoarthritis of knee: Secondary | ICD-10-CM | POA: Diagnosis not present

## 2018-10-18 DIAGNOSIS — M170 Bilateral primary osteoarthritis of knee: Secondary | ICD-10-CM | POA: Diagnosis not present

## 2018-10-19 DIAGNOSIS — M170 Bilateral primary osteoarthritis of knee: Secondary | ICD-10-CM | POA: Diagnosis not present

## 2018-10-20 DIAGNOSIS — M170 Bilateral primary osteoarthritis of knee: Secondary | ICD-10-CM | POA: Diagnosis not present

## 2018-10-21 DIAGNOSIS — M170 Bilateral primary osteoarthritis of knee: Secondary | ICD-10-CM | POA: Diagnosis not present

## 2018-10-22 DIAGNOSIS — M170 Bilateral primary osteoarthritis of knee: Secondary | ICD-10-CM | POA: Diagnosis not present

## 2018-10-23 DIAGNOSIS — M170 Bilateral primary osteoarthritis of knee: Secondary | ICD-10-CM | POA: Diagnosis not present

## 2018-10-24 DIAGNOSIS — M170 Bilateral primary osteoarthritis of knee: Secondary | ICD-10-CM | POA: Diagnosis not present

## 2018-10-25 DIAGNOSIS — M170 Bilateral primary osteoarthritis of knee: Secondary | ICD-10-CM | POA: Diagnosis not present

## 2018-10-26 DIAGNOSIS — M170 Bilateral primary osteoarthritis of knee: Secondary | ICD-10-CM | POA: Diagnosis not present

## 2018-10-27 DIAGNOSIS — M170 Bilateral primary osteoarthritis of knee: Secondary | ICD-10-CM | POA: Diagnosis not present

## 2018-10-28 DIAGNOSIS — M170 Bilateral primary osteoarthritis of knee: Secondary | ICD-10-CM | POA: Diagnosis not present

## 2018-10-29 DIAGNOSIS — M170 Bilateral primary osteoarthritis of knee: Secondary | ICD-10-CM | POA: Diagnosis not present

## 2018-10-30 DIAGNOSIS — M170 Bilateral primary osteoarthritis of knee: Secondary | ICD-10-CM | POA: Diagnosis not present

## 2018-10-31 DIAGNOSIS — M170 Bilateral primary osteoarthritis of knee: Secondary | ICD-10-CM | POA: Diagnosis not present

## 2018-11-01 DIAGNOSIS — M170 Bilateral primary osteoarthritis of knee: Secondary | ICD-10-CM | POA: Diagnosis not present

## 2018-11-02 DIAGNOSIS — M170 Bilateral primary osteoarthritis of knee: Secondary | ICD-10-CM | POA: Diagnosis not present

## 2018-11-03 DIAGNOSIS — M170 Bilateral primary osteoarthritis of knee: Secondary | ICD-10-CM | POA: Diagnosis not present

## 2018-11-04 DIAGNOSIS — M170 Bilateral primary osteoarthritis of knee: Secondary | ICD-10-CM | POA: Diagnosis not present

## 2018-11-05 DIAGNOSIS — M170 Bilateral primary osteoarthritis of knee: Secondary | ICD-10-CM | POA: Diagnosis not present

## 2018-11-06 DIAGNOSIS — M170 Bilateral primary osteoarthritis of knee: Secondary | ICD-10-CM | POA: Diagnosis not present

## 2018-11-07 DIAGNOSIS — M170 Bilateral primary osteoarthritis of knee: Secondary | ICD-10-CM | POA: Diagnosis not present

## 2018-11-08 DIAGNOSIS — M170 Bilateral primary osteoarthritis of knee: Secondary | ICD-10-CM | POA: Diagnosis not present

## 2018-11-09 DIAGNOSIS — M170 Bilateral primary osteoarthritis of knee: Secondary | ICD-10-CM | POA: Diagnosis not present

## 2018-11-10 DIAGNOSIS — M170 Bilateral primary osteoarthritis of knee: Secondary | ICD-10-CM | POA: Diagnosis not present

## 2018-11-11 DIAGNOSIS — M170 Bilateral primary osteoarthritis of knee: Secondary | ICD-10-CM | POA: Diagnosis not present

## 2018-11-12 DIAGNOSIS — M170 Bilateral primary osteoarthritis of knee: Secondary | ICD-10-CM | POA: Diagnosis not present

## 2018-11-13 DIAGNOSIS — M170 Bilateral primary osteoarthritis of knee: Secondary | ICD-10-CM | POA: Diagnosis not present

## 2018-11-14 DIAGNOSIS — M170 Bilateral primary osteoarthritis of knee: Secondary | ICD-10-CM | POA: Diagnosis not present

## 2018-11-15 DIAGNOSIS — M170 Bilateral primary osteoarthritis of knee: Secondary | ICD-10-CM | POA: Diagnosis not present

## 2018-11-16 DIAGNOSIS — M170 Bilateral primary osteoarthritis of knee: Secondary | ICD-10-CM | POA: Diagnosis not present

## 2018-11-17 DIAGNOSIS — M170 Bilateral primary osteoarthritis of knee: Secondary | ICD-10-CM | POA: Diagnosis not present

## 2018-11-18 DIAGNOSIS — M170 Bilateral primary osteoarthritis of knee: Secondary | ICD-10-CM | POA: Diagnosis not present

## 2018-11-19 DIAGNOSIS — M170 Bilateral primary osteoarthritis of knee: Secondary | ICD-10-CM | POA: Diagnosis not present

## 2018-11-20 DIAGNOSIS — M170 Bilateral primary osteoarthritis of knee: Secondary | ICD-10-CM | POA: Diagnosis not present

## 2018-11-21 DIAGNOSIS — M170 Bilateral primary osteoarthritis of knee: Secondary | ICD-10-CM | POA: Diagnosis not present

## 2018-11-22 DIAGNOSIS — M170 Bilateral primary osteoarthritis of knee: Secondary | ICD-10-CM | POA: Diagnosis not present

## 2018-11-23 DIAGNOSIS — M170 Bilateral primary osteoarthritis of knee: Secondary | ICD-10-CM | POA: Diagnosis not present

## 2018-11-24 DIAGNOSIS — M170 Bilateral primary osteoarthritis of knee: Secondary | ICD-10-CM | POA: Diagnosis not present

## 2018-11-25 DIAGNOSIS — M170 Bilateral primary osteoarthritis of knee: Secondary | ICD-10-CM | POA: Diagnosis not present

## 2018-11-26 DIAGNOSIS — M170 Bilateral primary osteoarthritis of knee: Secondary | ICD-10-CM | POA: Diagnosis not present

## 2018-11-27 DIAGNOSIS — M170 Bilateral primary osteoarthritis of knee: Secondary | ICD-10-CM | POA: Diagnosis not present

## 2018-11-28 DIAGNOSIS — M170 Bilateral primary osteoarthritis of knee: Secondary | ICD-10-CM | POA: Diagnosis not present

## 2018-11-29 DIAGNOSIS — M170 Bilateral primary osteoarthritis of knee: Secondary | ICD-10-CM | POA: Diagnosis not present

## 2018-11-30 DIAGNOSIS — M170 Bilateral primary osteoarthritis of knee: Secondary | ICD-10-CM | POA: Diagnosis not present

## 2018-12-01 DIAGNOSIS — M170 Bilateral primary osteoarthritis of knee: Secondary | ICD-10-CM | POA: Diagnosis not present

## 2018-12-02 DIAGNOSIS — M170 Bilateral primary osteoarthritis of knee: Secondary | ICD-10-CM | POA: Diagnosis not present

## 2018-12-03 DIAGNOSIS — M170 Bilateral primary osteoarthritis of knee: Secondary | ICD-10-CM | POA: Diagnosis not present

## 2018-12-04 DIAGNOSIS — M170 Bilateral primary osteoarthritis of knee: Secondary | ICD-10-CM | POA: Diagnosis not present

## 2018-12-05 DIAGNOSIS — M170 Bilateral primary osteoarthritis of knee: Secondary | ICD-10-CM | POA: Diagnosis not present

## 2018-12-06 DIAGNOSIS — M170 Bilateral primary osteoarthritis of knee: Secondary | ICD-10-CM | POA: Diagnosis not present

## 2018-12-07 DIAGNOSIS — M170 Bilateral primary osteoarthritis of knee: Secondary | ICD-10-CM | POA: Diagnosis not present

## 2018-12-08 DIAGNOSIS — M170 Bilateral primary osteoarthritis of knee: Secondary | ICD-10-CM | POA: Diagnosis not present

## 2018-12-09 DIAGNOSIS — M170 Bilateral primary osteoarthritis of knee: Secondary | ICD-10-CM | POA: Diagnosis not present

## 2018-12-10 DIAGNOSIS — M170 Bilateral primary osteoarthritis of knee: Secondary | ICD-10-CM | POA: Diagnosis not present

## 2018-12-11 DIAGNOSIS — M170 Bilateral primary osteoarthritis of knee: Secondary | ICD-10-CM | POA: Diagnosis not present

## 2018-12-12 DIAGNOSIS — M170 Bilateral primary osteoarthritis of knee: Secondary | ICD-10-CM | POA: Diagnosis not present

## 2018-12-13 DIAGNOSIS — M170 Bilateral primary osteoarthritis of knee: Secondary | ICD-10-CM | POA: Diagnosis not present

## 2018-12-14 DIAGNOSIS — M170 Bilateral primary osteoarthritis of knee: Secondary | ICD-10-CM | POA: Diagnosis not present

## 2018-12-15 DIAGNOSIS — M170 Bilateral primary osteoarthritis of knee: Secondary | ICD-10-CM | POA: Diagnosis not present

## 2018-12-16 DIAGNOSIS — M170 Bilateral primary osteoarthritis of knee: Secondary | ICD-10-CM | POA: Diagnosis not present

## 2018-12-17 DIAGNOSIS — M170 Bilateral primary osteoarthritis of knee: Secondary | ICD-10-CM | POA: Diagnosis not present

## 2018-12-18 DIAGNOSIS — M170 Bilateral primary osteoarthritis of knee: Secondary | ICD-10-CM | POA: Diagnosis not present

## 2018-12-19 DIAGNOSIS — M170 Bilateral primary osteoarthritis of knee: Secondary | ICD-10-CM | POA: Diagnosis not present

## 2018-12-20 DIAGNOSIS — M170 Bilateral primary osteoarthritis of knee: Secondary | ICD-10-CM | POA: Diagnosis not present

## 2018-12-21 DIAGNOSIS — M170 Bilateral primary osteoarthritis of knee: Secondary | ICD-10-CM | POA: Diagnosis not present

## 2018-12-22 ENCOUNTER — Other Ambulatory Visit: Payer: Self-pay | Admitting: Orthopedic Surgery

## 2018-12-22 DIAGNOSIS — M170 Bilateral primary osteoarthritis of knee: Secondary | ICD-10-CM | POA: Diagnosis not present

## 2018-12-22 DIAGNOSIS — Z6841 Body Mass Index (BMI) 40.0 and over, adult: Secondary | ICD-10-CM | POA: Insufficient documentation

## 2018-12-22 DIAGNOSIS — T8484XA Pain due to internal orthopedic prosthetic devices, implants and grafts, initial encounter: Secondary | ICD-10-CM

## 2018-12-22 DIAGNOSIS — Z96651 Presence of right artificial knee joint: Secondary | ICD-10-CM | POA: Diagnosis not present

## 2018-12-23 DIAGNOSIS — M170 Bilateral primary osteoarthritis of knee: Secondary | ICD-10-CM | POA: Diagnosis not present

## 2018-12-24 DIAGNOSIS — M170 Bilateral primary osteoarthritis of knee: Secondary | ICD-10-CM | POA: Diagnosis not present

## 2018-12-25 DIAGNOSIS — M170 Bilateral primary osteoarthritis of knee: Secondary | ICD-10-CM | POA: Diagnosis not present

## 2018-12-26 DIAGNOSIS — M170 Bilateral primary osteoarthritis of knee: Secondary | ICD-10-CM | POA: Diagnosis not present

## 2018-12-27 DIAGNOSIS — M170 Bilateral primary osteoarthritis of knee: Secondary | ICD-10-CM | POA: Diagnosis not present

## 2018-12-28 DIAGNOSIS — M170 Bilateral primary osteoarthritis of knee: Secondary | ICD-10-CM | POA: Diagnosis not present

## 2018-12-29 DIAGNOSIS — M170 Bilateral primary osteoarthritis of knee: Secondary | ICD-10-CM | POA: Diagnosis not present

## 2018-12-30 ENCOUNTER — Other Ambulatory Visit: Payer: Self-pay

## 2018-12-30 ENCOUNTER — Encounter
Admission: RE | Admit: 2018-12-30 | Discharge: 2018-12-30 | Disposition: A | Discharge status: Home / Self Care | Payer: Medicaid Other | Source: Ambulatory Visit | Attending: Orthopedic Surgery | Admitting: Orthopedic Surgery

## 2018-12-30 DIAGNOSIS — Z96651 Presence of right artificial knee joint: Secondary | ICD-10-CM | POA: Diagnosis not present

## 2018-12-30 DIAGNOSIS — Z471 Aftercare following joint replacement surgery: Secondary | ICD-10-CM | POA: Diagnosis not present

## 2018-12-30 DIAGNOSIS — T8484XA Pain due to internal orthopedic prosthetic devices, implants and grafts, initial encounter: Secondary | ICD-10-CM | POA: Diagnosis not present

## 2018-12-30 DIAGNOSIS — M170 Bilateral primary osteoarthritis of knee: Secondary | ICD-10-CM | POA: Diagnosis not present

## 2018-12-30 MED ORDER — TECHNETIUM TC 99M MEDRONATE IV KIT
22.0000 | PACK | Freq: Once | INTRAVENOUS | Status: AC | PRN
  Administered 2018-12-30: 21.555 via INTRAVENOUS

## 2018-12-31 DIAGNOSIS — M170 Bilateral primary osteoarthritis of knee: Secondary | ICD-10-CM | POA: Diagnosis not present

## 2019-01-01 DIAGNOSIS — M170 Bilateral primary osteoarthritis of knee: Secondary | ICD-10-CM | POA: Diagnosis not present

## 2019-01-02 DIAGNOSIS — M170 Bilateral primary osteoarthritis of knee: Secondary | ICD-10-CM | POA: Diagnosis not present

## 2019-01-03 DIAGNOSIS — M170 Bilateral primary osteoarthritis of knee: Secondary | ICD-10-CM | POA: Diagnosis not present

## 2019-01-04 DIAGNOSIS — M170 Bilateral primary osteoarthritis of knee: Secondary | ICD-10-CM | POA: Diagnosis not present

## 2019-01-05 DIAGNOSIS — M170 Bilateral primary osteoarthritis of knee: Secondary | ICD-10-CM | POA: Diagnosis not present

## 2019-01-06 DIAGNOSIS — M170 Bilateral primary osteoarthritis of knee: Secondary | ICD-10-CM | POA: Diagnosis not present

## 2019-01-07 DIAGNOSIS — M170 Bilateral primary osteoarthritis of knee: Secondary | ICD-10-CM | POA: Diagnosis not present

## 2019-01-08 DIAGNOSIS — M170 Bilateral primary osteoarthritis of knee: Secondary | ICD-10-CM | POA: Diagnosis not present

## 2019-01-09 DIAGNOSIS — M170 Bilateral primary osteoarthritis of knee: Secondary | ICD-10-CM | POA: Diagnosis not present

## 2019-01-10 DIAGNOSIS — M170 Bilateral primary osteoarthritis of knee: Secondary | ICD-10-CM | POA: Diagnosis not present

## 2019-01-11 DIAGNOSIS — M170 Bilateral primary osteoarthritis of knee: Secondary | ICD-10-CM | POA: Diagnosis not present

## 2019-01-12 DIAGNOSIS — M170 Bilateral primary osteoarthritis of knee: Secondary | ICD-10-CM | POA: Diagnosis not present

## 2019-01-13 DIAGNOSIS — M170 Bilateral primary osteoarthritis of knee: Secondary | ICD-10-CM | POA: Diagnosis not present

## 2019-01-14 DIAGNOSIS — M170 Bilateral primary osteoarthritis of knee: Secondary | ICD-10-CM | POA: Diagnosis not present

## 2019-01-15 DIAGNOSIS — M170 Bilateral primary osteoarthritis of knee: Secondary | ICD-10-CM | POA: Diagnosis not present

## 2019-01-16 DIAGNOSIS — M170 Bilateral primary osteoarthritis of knee: Secondary | ICD-10-CM | POA: Diagnosis not present

## 2019-01-17 DIAGNOSIS — M170 Bilateral primary osteoarthritis of knee: Secondary | ICD-10-CM | POA: Diagnosis not present

## 2019-01-18 DIAGNOSIS — M170 Bilateral primary osteoarthritis of knee: Secondary | ICD-10-CM | POA: Diagnosis not present

## 2019-01-19 DIAGNOSIS — M170 Bilateral primary osteoarthritis of knee: Secondary | ICD-10-CM | POA: Diagnosis not present

## 2019-01-20 DIAGNOSIS — M170 Bilateral primary osteoarthritis of knee: Secondary | ICD-10-CM | POA: Diagnosis not present

## 2019-01-21 DIAGNOSIS — Z96659 Presence of unspecified artificial knee joint: Secondary | ICD-10-CM | POA: Diagnosis not present

## 2019-01-21 DIAGNOSIS — Z6841 Body Mass Index (BMI) 40.0 and over, adult: Secondary | ICD-10-CM | POA: Diagnosis not present

## 2019-01-21 DIAGNOSIS — T84018D Broken internal joint prosthesis, other site, subsequent encounter: Secondary | ICD-10-CM | POA: Diagnosis not present

## 2019-01-21 DIAGNOSIS — M170 Bilateral primary osteoarthritis of knee: Secondary | ICD-10-CM | POA: Diagnosis not present

## 2019-01-22 DIAGNOSIS — M170 Bilateral primary osteoarthritis of knee: Secondary | ICD-10-CM | POA: Diagnosis not present

## 2019-01-23 DIAGNOSIS — M170 Bilateral primary osteoarthritis of knee: Secondary | ICD-10-CM | POA: Diagnosis not present

## 2019-01-24 DIAGNOSIS — M170 Bilateral primary osteoarthritis of knee: Secondary | ICD-10-CM | POA: Diagnosis not present

## 2019-01-25 DIAGNOSIS — M170 Bilateral primary osteoarthritis of knee: Secondary | ICD-10-CM | POA: Diagnosis not present

## 2019-01-26 DIAGNOSIS — M170 Bilateral primary osteoarthritis of knee: Secondary | ICD-10-CM | POA: Diagnosis not present

## 2019-01-27 DIAGNOSIS — M170 Bilateral primary osteoarthritis of knee: Secondary | ICD-10-CM | POA: Diagnosis not present

## 2019-01-28 DIAGNOSIS — M170 Bilateral primary osteoarthritis of knee: Secondary | ICD-10-CM | POA: Diagnosis not present

## 2019-01-29 DIAGNOSIS — M170 Bilateral primary osteoarthritis of knee: Secondary | ICD-10-CM | POA: Diagnosis not present

## 2019-01-30 DIAGNOSIS — M170 Bilateral primary osteoarthritis of knee: Secondary | ICD-10-CM | POA: Diagnosis not present

## 2019-01-31 DIAGNOSIS — M170 Bilateral primary osteoarthritis of knee: Secondary | ICD-10-CM | POA: Diagnosis not present

## 2019-02-01 DIAGNOSIS — M170 Bilateral primary osteoarthritis of knee: Secondary | ICD-10-CM | POA: Diagnosis not present

## 2019-02-02 DIAGNOSIS — M170 Bilateral primary osteoarthritis of knee: Secondary | ICD-10-CM | POA: Diagnosis not present

## 2019-02-03 DIAGNOSIS — M170 Bilateral primary osteoarthritis of knee: Secondary | ICD-10-CM | POA: Diagnosis not present

## 2019-02-04 DIAGNOSIS — M170 Bilateral primary osteoarthritis of knee: Secondary | ICD-10-CM | POA: Diagnosis not present

## 2019-02-05 DIAGNOSIS — M170 Bilateral primary osteoarthritis of knee: Secondary | ICD-10-CM | POA: Diagnosis not present

## 2019-02-06 DIAGNOSIS — M170 Bilateral primary osteoarthritis of knee: Secondary | ICD-10-CM | POA: Diagnosis not present

## 2019-02-07 DIAGNOSIS — M170 Bilateral primary osteoarthritis of knee: Secondary | ICD-10-CM | POA: Diagnosis not present

## 2019-02-08 DIAGNOSIS — M170 Bilateral primary osteoarthritis of knee: Secondary | ICD-10-CM | POA: Diagnosis not present

## 2019-02-09 DIAGNOSIS — M170 Bilateral primary osteoarthritis of knee: Secondary | ICD-10-CM | POA: Diagnosis not present

## 2019-02-10 DIAGNOSIS — M170 Bilateral primary osteoarthritis of knee: Secondary | ICD-10-CM | POA: Diagnosis not present

## 2019-02-11 DIAGNOSIS — M170 Bilateral primary osteoarthritis of knee: Secondary | ICD-10-CM | POA: Diagnosis not present

## 2019-02-12 DIAGNOSIS — M170 Bilateral primary osteoarthritis of knee: Secondary | ICD-10-CM | POA: Diagnosis not present

## 2019-02-13 DIAGNOSIS — M170 Bilateral primary osteoarthritis of knee: Secondary | ICD-10-CM | POA: Diagnosis not present

## 2019-02-14 DIAGNOSIS — M170 Bilateral primary osteoarthritis of knee: Secondary | ICD-10-CM | POA: Diagnosis not present

## 2019-02-15 DIAGNOSIS — M170 Bilateral primary osteoarthritis of knee: Secondary | ICD-10-CM | POA: Diagnosis not present

## 2019-02-16 DIAGNOSIS — M170 Bilateral primary osteoarthritis of knee: Secondary | ICD-10-CM | POA: Diagnosis not present

## 2019-02-17 DIAGNOSIS — M170 Bilateral primary osteoarthritis of knee: Secondary | ICD-10-CM | POA: Diagnosis not present

## 2019-02-18 DIAGNOSIS — M170 Bilateral primary osteoarthritis of knee: Secondary | ICD-10-CM | POA: Diagnosis not present

## 2019-02-19 DIAGNOSIS — M170 Bilateral primary osteoarthritis of knee: Secondary | ICD-10-CM | POA: Diagnosis not present

## 2019-02-20 DIAGNOSIS — M170 Bilateral primary osteoarthritis of knee: Secondary | ICD-10-CM | POA: Diagnosis not present

## 2019-02-21 DIAGNOSIS — M170 Bilateral primary osteoarthritis of knee: Secondary | ICD-10-CM | POA: Diagnosis not present

## 2019-02-22 DIAGNOSIS — M170 Bilateral primary osteoarthritis of knee: Secondary | ICD-10-CM | POA: Diagnosis not present

## 2019-02-23 DIAGNOSIS — M170 Bilateral primary osteoarthritis of knee: Secondary | ICD-10-CM | POA: Diagnosis not present

## 2019-02-24 DIAGNOSIS — M170 Bilateral primary osteoarthritis of knee: Secondary | ICD-10-CM | POA: Diagnosis not present

## 2019-02-25 DIAGNOSIS — M170 Bilateral primary osteoarthritis of knee: Secondary | ICD-10-CM | POA: Diagnosis not present

## 2019-02-26 DIAGNOSIS — M170 Bilateral primary osteoarthritis of knee: Secondary | ICD-10-CM | POA: Diagnosis not present

## 2019-02-27 DIAGNOSIS — M170 Bilateral primary osteoarthritis of knee: Secondary | ICD-10-CM | POA: Diagnosis not present

## 2019-02-28 DIAGNOSIS — M170 Bilateral primary osteoarthritis of knee: Secondary | ICD-10-CM | POA: Diagnosis not present

## 2019-03-01 DIAGNOSIS — M170 Bilateral primary osteoarthritis of knee: Secondary | ICD-10-CM | POA: Diagnosis not present

## 2019-03-02 DIAGNOSIS — M170 Bilateral primary osteoarthritis of knee: Secondary | ICD-10-CM | POA: Diagnosis not present

## 2019-03-03 DIAGNOSIS — M170 Bilateral primary osteoarthritis of knee: Secondary | ICD-10-CM | POA: Diagnosis not present

## 2019-03-04 DIAGNOSIS — M170 Bilateral primary osteoarthritis of knee: Secondary | ICD-10-CM | POA: Diagnosis not present

## 2019-03-05 DIAGNOSIS — M170 Bilateral primary osteoarthritis of knee: Secondary | ICD-10-CM | POA: Diagnosis not present

## 2019-03-06 DIAGNOSIS — M170 Bilateral primary osteoarthritis of knee: Secondary | ICD-10-CM | POA: Diagnosis not present

## 2019-03-07 DIAGNOSIS — M170 Bilateral primary osteoarthritis of knee: Secondary | ICD-10-CM | POA: Diagnosis not present

## 2019-03-08 DIAGNOSIS — M170 Bilateral primary osteoarthritis of knee: Secondary | ICD-10-CM | POA: Diagnosis not present

## 2019-03-09 DIAGNOSIS — M170 Bilateral primary osteoarthritis of knee: Secondary | ICD-10-CM | POA: Diagnosis not present

## 2019-03-10 DIAGNOSIS — M170 Bilateral primary osteoarthritis of knee: Secondary | ICD-10-CM | POA: Diagnosis not present

## 2019-03-11 DIAGNOSIS — M170 Bilateral primary osteoarthritis of knee: Secondary | ICD-10-CM | POA: Diagnosis not present

## 2019-03-12 DIAGNOSIS — M170 Bilateral primary osteoarthritis of knee: Secondary | ICD-10-CM | POA: Diagnosis not present

## 2019-03-13 DIAGNOSIS — M170 Bilateral primary osteoarthritis of knee: Secondary | ICD-10-CM | POA: Diagnosis not present

## 2019-03-14 DIAGNOSIS — M170 Bilateral primary osteoarthritis of knee: Secondary | ICD-10-CM | POA: Diagnosis not present

## 2019-03-15 DIAGNOSIS — M170 Bilateral primary osteoarthritis of knee: Secondary | ICD-10-CM | POA: Diagnosis not present

## 2019-03-16 DIAGNOSIS — M170 Bilateral primary osteoarthritis of knee: Secondary | ICD-10-CM | POA: Diagnosis not present

## 2019-03-17 DIAGNOSIS — M170 Bilateral primary osteoarthritis of knee: Secondary | ICD-10-CM | POA: Diagnosis not present

## 2019-03-17 DIAGNOSIS — H1031 Unspecified acute conjunctivitis, right eye: Secondary | ICD-10-CM | POA: Diagnosis not present

## 2019-03-17 DIAGNOSIS — Q159 Congenital malformation of eye, unspecified: Secondary | ICD-10-CM | POA: Diagnosis not present

## 2019-03-18 DIAGNOSIS — M170 Bilateral primary osteoarthritis of knee: Secondary | ICD-10-CM | POA: Diagnosis not present

## 2019-03-19 DIAGNOSIS — M170 Bilateral primary osteoarthritis of knee: Secondary | ICD-10-CM | POA: Diagnosis not present

## 2019-03-20 DIAGNOSIS — M170 Bilateral primary osteoarthritis of knee: Secondary | ICD-10-CM | POA: Diagnosis not present

## 2019-03-21 DIAGNOSIS — M170 Bilateral primary osteoarthritis of knee: Secondary | ICD-10-CM | POA: Diagnosis not present

## 2019-03-22 DIAGNOSIS — M170 Bilateral primary osteoarthritis of knee: Secondary | ICD-10-CM | POA: Diagnosis not present

## 2019-03-23 DIAGNOSIS — M170 Bilateral primary osteoarthritis of knee: Secondary | ICD-10-CM | POA: Diagnosis not present

## 2019-03-24 DIAGNOSIS — M170 Bilateral primary osteoarthritis of knee: Secondary | ICD-10-CM | POA: Diagnosis not present

## 2019-03-25 DIAGNOSIS — M170 Bilateral primary osteoarthritis of knee: Secondary | ICD-10-CM | POA: Diagnosis not present

## 2019-03-26 DIAGNOSIS — M170 Bilateral primary osteoarthritis of knee: Secondary | ICD-10-CM | POA: Diagnosis not present

## 2019-03-27 DIAGNOSIS — M170 Bilateral primary osteoarthritis of knee: Secondary | ICD-10-CM | POA: Diagnosis not present

## 2019-03-28 DIAGNOSIS — M170 Bilateral primary osteoarthritis of knee: Secondary | ICD-10-CM | POA: Diagnosis not present

## 2019-03-29 DIAGNOSIS — M170 Bilateral primary osteoarthritis of knee: Secondary | ICD-10-CM | POA: Diagnosis not present

## 2019-03-30 DIAGNOSIS — M170 Bilateral primary osteoarthritis of knee: Secondary | ICD-10-CM | POA: Diagnosis not present

## 2019-03-31 DIAGNOSIS — M170 Bilateral primary osteoarthritis of knee: Secondary | ICD-10-CM | POA: Diagnosis not present

## 2019-04-01 DIAGNOSIS — M170 Bilateral primary osteoarthritis of knee: Secondary | ICD-10-CM | POA: Diagnosis not present

## 2019-04-02 DIAGNOSIS — M170 Bilateral primary osteoarthritis of knee: Secondary | ICD-10-CM | POA: Diagnosis not present

## 2019-04-03 DIAGNOSIS — M170 Bilateral primary osteoarthritis of knee: Secondary | ICD-10-CM | POA: Diagnosis not present

## 2019-04-04 DIAGNOSIS — M170 Bilateral primary osteoarthritis of knee: Secondary | ICD-10-CM | POA: Diagnosis not present

## 2019-04-05 DIAGNOSIS — M170 Bilateral primary osteoarthritis of knee: Secondary | ICD-10-CM | POA: Diagnosis not present

## 2019-04-06 DIAGNOSIS — M170 Bilateral primary osteoarthritis of knee: Secondary | ICD-10-CM | POA: Diagnosis not present

## 2019-04-07 DIAGNOSIS — M170 Bilateral primary osteoarthritis of knee: Secondary | ICD-10-CM | POA: Diagnosis not present

## 2019-04-08 DIAGNOSIS — M170 Bilateral primary osteoarthritis of knee: Secondary | ICD-10-CM | POA: Diagnosis not present

## 2019-04-09 DIAGNOSIS — M170 Bilateral primary osteoarthritis of knee: Secondary | ICD-10-CM | POA: Diagnosis not present

## 2019-04-10 DIAGNOSIS — M170 Bilateral primary osteoarthritis of knee: Secondary | ICD-10-CM | POA: Diagnosis not present

## 2019-04-11 DIAGNOSIS — M170 Bilateral primary osteoarthritis of knee: Secondary | ICD-10-CM | POA: Diagnosis not present

## 2019-04-12 DIAGNOSIS — M170 Bilateral primary osteoarthritis of knee: Secondary | ICD-10-CM | POA: Diagnosis not present

## 2019-04-13 DIAGNOSIS — M170 Bilateral primary osteoarthritis of knee: Secondary | ICD-10-CM | POA: Diagnosis not present

## 2019-04-14 DIAGNOSIS — M170 Bilateral primary osteoarthritis of knee: Secondary | ICD-10-CM | POA: Diagnosis not present

## 2019-04-15 DIAGNOSIS — M170 Bilateral primary osteoarthritis of knee: Secondary | ICD-10-CM | POA: Diagnosis not present

## 2019-04-16 DIAGNOSIS — M170 Bilateral primary osteoarthritis of knee: Secondary | ICD-10-CM | POA: Diagnosis not present

## 2019-04-17 DIAGNOSIS — M170 Bilateral primary osteoarthritis of knee: Secondary | ICD-10-CM | POA: Diagnosis not present

## 2019-04-18 DIAGNOSIS — M170 Bilateral primary osteoarthritis of knee: Secondary | ICD-10-CM | POA: Diagnosis not present

## 2019-04-19 DIAGNOSIS — M170 Bilateral primary osteoarthritis of knee: Secondary | ICD-10-CM | POA: Diagnosis not present

## 2019-04-20 DIAGNOSIS — M170 Bilateral primary osteoarthritis of knee: Secondary | ICD-10-CM | POA: Diagnosis not present

## 2019-04-21 DIAGNOSIS — M170 Bilateral primary osteoarthritis of knee: Secondary | ICD-10-CM | POA: Diagnosis not present

## 2019-04-22 DIAGNOSIS — M170 Bilateral primary osteoarthritis of knee: Secondary | ICD-10-CM | POA: Diagnosis not present

## 2019-04-23 DIAGNOSIS — M170 Bilateral primary osteoarthritis of knee: Secondary | ICD-10-CM | POA: Diagnosis not present

## 2019-04-24 DIAGNOSIS — M170 Bilateral primary osteoarthritis of knee: Secondary | ICD-10-CM | POA: Diagnosis not present

## 2019-04-25 DIAGNOSIS — M170 Bilateral primary osteoarthritis of knee: Secondary | ICD-10-CM | POA: Diagnosis not present

## 2019-04-26 DIAGNOSIS — M170 Bilateral primary osteoarthritis of knee: Secondary | ICD-10-CM | POA: Diagnosis not present

## 2019-04-27 DIAGNOSIS — M170 Bilateral primary osteoarthritis of knee: Secondary | ICD-10-CM | POA: Diagnosis not present

## 2019-04-28 DIAGNOSIS — M170 Bilateral primary osteoarthritis of knee: Secondary | ICD-10-CM | POA: Diagnosis not present

## 2019-04-29 DIAGNOSIS — M170 Bilateral primary osteoarthritis of knee: Secondary | ICD-10-CM | POA: Diagnosis not present

## 2019-04-30 DIAGNOSIS — M170 Bilateral primary osteoarthritis of knee: Secondary | ICD-10-CM | POA: Diagnosis not present

## 2019-05-01 DIAGNOSIS — M170 Bilateral primary osteoarthritis of knee: Secondary | ICD-10-CM | POA: Diagnosis not present

## 2019-05-02 DIAGNOSIS — M170 Bilateral primary osteoarthritis of knee: Secondary | ICD-10-CM | POA: Diagnosis not present

## 2019-05-03 DIAGNOSIS — M170 Bilateral primary osteoarthritis of knee: Secondary | ICD-10-CM | POA: Diagnosis not present

## 2019-05-04 DIAGNOSIS — M170 Bilateral primary osteoarthritis of knee: Secondary | ICD-10-CM | POA: Diagnosis not present

## 2019-05-05 DIAGNOSIS — M170 Bilateral primary osteoarthritis of knee: Secondary | ICD-10-CM | POA: Diagnosis not present

## 2019-05-06 DIAGNOSIS — M170 Bilateral primary osteoarthritis of knee: Secondary | ICD-10-CM | POA: Diagnosis not present

## 2019-05-07 DIAGNOSIS — M170 Bilateral primary osteoarthritis of knee: Secondary | ICD-10-CM | POA: Diagnosis not present

## 2019-05-08 DIAGNOSIS — M170 Bilateral primary osteoarthritis of knee: Secondary | ICD-10-CM | POA: Diagnosis not present

## 2019-05-09 DIAGNOSIS — M170 Bilateral primary osteoarthritis of knee: Secondary | ICD-10-CM | POA: Diagnosis not present

## 2019-05-10 DIAGNOSIS — M170 Bilateral primary osteoarthritis of knee: Secondary | ICD-10-CM | POA: Diagnosis not present

## 2019-05-11 DIAGNOSIS — M170 Bilateral primary osteoarthritis of knee: Secondary | ICD-10-CM | POA: Diagnosis not present

## 2019-05-12 DIAGNOSIS — M170 Bilateral primary osteoarthritis of knee: Secondary | ICD-10-CM | POA: Diagnosis not present

## 2019-05-13 DIAGNOSIS — M170 Bilateral primary osteoarthritis of knee: Secondary | ICD-10-CM | POA: Diagnosis not present

## 2019-05-14 DIAGNOSIS — M170 Bilateral primary osteoarthritis of knee: Secondary | ICD-10-CM | POA: Diagnosis not present

## 2019-05-15 DIAGNOSIS — M170 Bilateral primary osteoarthritis of knee: Secondary | ICD-10-CM | POA: Diagnosis not present

## 2019-05-16 DIAGNOSIS — M170 Bilateral primary osteoarthritis of knee: Secondary | ICD-10-CM | POA: Diagnosis not present

## 2019-05-17 DIAGNOSIS — M170 Bilateral primary osteoarthritis of knee: Secondary | ICD-10-CM | POA: Diagnosis not present

## 2019-05-18 DIAGNOSIS — M170 Bilateral primary osteoarthritis of knee: Secondary | ICD-10-CM | POA: Diagnosis not present

## 2019-05-19 DIAGNOSIS — H524 Presbyopia: Secondary | ICD-10-CM | POA: Diagnosis not present

## 2019-05-19 DIAGNOSIS — M170 Bilateral primary osteoarthritis of knee: Secondary | ICD-10-CM | POA: Diagnosis not present

## 2019-05-20 DIAGNOSIS — H5213 Myopia, bilateral: Secondary | ICD-10-CM | POA: Diagnosis not present

## 2019-05-20 DIAGNOSIS — M170 Bilateral primary osteoarthritis of knee: Secondary | ICD-10-CM | POA: Diagnosis not present

## 2019-05-21 DIAGNOSIS — M170 Bilateral primary osteoarthritis of knee: Secondary | ICD-10-CM | POA: Diagnosis not present

## 2019-05-22 DIAGNOSIS — M170 Bilateral primary osteoarthritis of knee: Secondary | ICD-10-CM | POA: Diagnosis not present

## 2019-05-23 DIAGNOSIS — M170 Bilateral primary osteoarthritis of knee: Secondary | ICD-10-CM | POA: Diagnosis not present

## 2019-05-24 DIAGNOSIS — M170 Bilateral primary osteoarthritis of knee: Secondary | ICD-10-CM | POA: Diagnosis not present

## 2019-05-25 ENCOUNTER — Other Ambulatory Visit: Payer: Self-pay

## 2019-05-25 DIAGNOSIS — M170 Bilateral primary osteoarthritis of knee: Secondary | ICD-10-CM | POA: Diagnosis not present

## 2019-05-25 DIAGNOSIS — Z20822 Contact with and (suspected) exposure to covid-19: Secondary | ICD-10-CM

## 2019-05-25 DIAGNOSIS — Z20828 Contact with and (suspected) exposure to other viral communicable diseases: Secondary | ICD-10-CM | POA: Diagnosis not present

## 2019-05-26 DIAGNOSIS — M170 Bilateral primary osteoarthritis of knee: Secondary | ICD-10-CM | POA: Diagnosis not present

## 2019-05-27 DIAGNOSIS — M170 Bilateral primary osteoarthritis of knee: Secondary | ICD-10-CM | POA: Diagnosis not present

## 2019-05-27 LAB — NOVEL CORONAVIRUS, NAA: SARS-CoV-2, NAA: NOT DETECTED

## 2019-05-28 DIAGNOSIS — M170 Bilateral primary osteoarthritis of knee: Secondary | ICD-10-CM | POA: Diagnosis not present

## 2019-05-29 DIAGNOSIS — M170 Bilateral primary osteoarthritis of knee: Secondary | ICD-10-CM | POA: Diagnosis not present

## 2019-05-30 DIAGNOSIS — M170 Bilateral primary osteoarthritis of knee: Secondary | ICD-10-CM | POA: Diagnosis not present

## 2019-05-31 DIAGNOSIS — M170 Bilateral primary osteoarthritis of knee: Secondary | ICD-10-CM | POA: Diagnosis not present

## 2019-05-31 DIAGNOSIS — J0100 Acute maxillary sinusitis, unspecified: Secondary | ICD-10-CM | POA: Diagnosis not present

## 2019-06-01 DIAGNOSIS — M170 Bilateral primary osteoarthritis of knee: Secondary | ICD-10-CM | POA: Diagnosis not present

## 2019-06-02 DIAGNOSIS — M170 Bilateral primary osteoarthritis of knee: Secondary | ICD-10-CM | POA: Diagnosis not present

## 2019-06-03 DIAGNOSIS — M170 Bilateral primary osteoarthritis of knee: Secondary | ICD-10-CM | POA: Diagnosis not present

## 2019-06-04 DIAGNOSIS — M170 Bilateral primary osteoarthritis of knee: Secondary | ICD-10-CM | POA: Diagnosis not present

## 2019-06-05 DIAGNOSIS — M170 Bilateral primary osteoarthritis of knee: Secondary | ICD-10-CM | POA: Diagnosis not present

## 2019-06-06 DIAGNOSIS — M170 Bilateral primary osteoarthritis of knee: Secondary | ICD-10-CM | POA: Diagnosis not present

## 2019-06-07 DIAGNOSIS — M170 Bilateral primary osteoarthritis of knee: Secondary | ICD-10-CM | POA: Diagnosis not present

## 2019-06-08 DIAGNOSIS — M170 Bilateral primary osteoarthritis of knee: Secondary | ICD-10-CM | POA: Diagnosis not present

## 2019-06-09 ENCOUNTER — Ambulatory Visit: Payer: Medicaid Other | Admitting: Student in an Organized Health Care Education/Training Program

## 2019-06-09 DIAGNOSIS — M170 Bilateral primary osteoarthritis of knee: Secondary | ICD-10-CM | POA: Diagnosis not present

## 2019-06-10 DIAGNOSIS — M170 Bilateral primary osteoarthritis of knee: Secondary | ICD-10-CM | POA: Diagnosis not present

## 2019-06-11 DIAGNOSIS — M170 Bilateral primary osteoarthritis of knee: Secondary | ICD-10-CM | POA: Diagnosis not present

## 2019-06-12 DIAGNOSIS — M170 Bilateral primary osteoarthritis of knee: Secondary | ICD-10-CM | POA: Diagnosis not present

## 2019-06-13 DIAGNOSIS — M170 Bilateral primary osteoarthritis of knee: Secondary | ICD-10-CM | POA: Diagnosis not present

## 2019-06-14 DIAGNOSIS — M170 Bilateral primary osteoarthritis of knee: Secondary | ICD-10-CM | POA: Diagnosis not present

## 2019-06-15 DIAGNOSIS — M170 Bilateral primary osteoarthritis of knee: Secondary | ICD-10-CM | POA: Diagnosis not present

## 2019-06-16 DIAGNOSIS — M170 Bilateral primary osteoarthritis of knee: Secondary | ICD-10-CM | POA: Diagnosis not present

## 2019-06-17 DIAGNOSIS — M170 Bilateral primary osteoarthritis of knee: Secondary | ICD-10-CM | POA: Diagnosis not present

## 2019-06-18 DIAGNOSIS — M170 Bilateral primary osteoarthritis of knee: Secondary | ICD-10-CM | POA: Diagnosis not present

## 2019-06-19 DIAGNOSIS — M170 Bilateral primary osteoarthritis of knee: Secondary | ICD-10-CM | POA: Diagnosis not present

## 2019-06-20 DIAGNOSIS — M170 Bilateral primary osteoarthritis of knee: Secondary | ICD-10-CM | POA: Diagnosis not present

## 2019-06-21 DIAGNOSIS — M170 Bilateral primary osteoarthritis of knee: Secondary | ICD-10-CM | POA: Diagnosis not present

## 2019-06-22 DIAGNOSIS — M170 Bilateral primary osteoarthritis of knee: Secondary | ICD-10-CM | POA: Diagnosis not present

## 2019-06-23 DIAGNOSIS — M170 Bilateral primary osteoarthritis of knee: Secondary | ICD-10-CM | POA: Diagnosis not present

## 2019-06-24 DIAGNOSIS — M170 Bilateral primary osteoarthritis of knee: Secondary | ICD-10-CM | POA: Diagnosis not present

## 2019-06-25 DIAGNOSIS — K449 Diaphragmatic hernia without obstruction or gangrene: Secondary | ICD-10-CM | POA: Diagnosis not present

## 2019-06-25 DIAGNOSIS — M170 Bilateral primary osteoarthritis of knee: Secondary | ICD-10-CM | POA: Diagnosis not present

## 2019-06-25 DIAGNOSIS — Z7982 Long term (current) use of aspirin: Secondary | ICD-10-CM | POA: Diagnosis not present

## 2019-06-25 DIAGNOSIS — B20 Human immunodeficiency virus [HIV] disease: Secondary | ICD-10-CM | POA: Diagnosis not present

## 2019-06-25 DIAGNOSIS — K219 Gastro-esophageal reflux disease without esophagitis: Secondary | ICD-10-CM | POA: Diagnosis not present

## 2019-06-25 DIAGNOSIS — Z23 Encounter for immunization: Secondary | ICD-10-CM | POA: Diagnosis not present

## 2019-06-25 DIAGNOSIS — Z21 Asymptomatic human immunodeficiency virus [HIV] infection status: Secondary | ICD-10-CM | POA: Diagnosis not present

## 2019-06-25 DIAGNOSIS — R12 Heartburn: Secondary | ICD-10-CM | POA: Diagnosis not present

## 2019-06-25 DIAGNOSIS — Z79899 Other long term (current) drug therapy: Secondary | ICD-10-CM | POA: Diagnosis not present

## 2019-06-25 DIAGNOSIS — B182 Chronic viral hepatitis C: Secondary | ICD-10-CM | POA: Diagnosis not present

## 2019-06-26 DIAGNOSIS — M170 Bilateral primary osteoarthritis of knee: Secondary | ICD-10-CM | POA: Diagnosis not present

## 2019-06-27 DIAGNOSIS — M170 Bilateral primary osteoarthritis of knee: Secondary | ICD-10-CM | POA: Diagnosis not present

## 2019-06-28 DIAGNOSIS — M170 Bilateral primary osteoarthritis of knee: Secondary | ICD-10-CM | POA: Diagnosis not present

## 2019-06-29 DIAGNOSIS — M170 Bilateral primary osteoarthritis of knee: Secondary | ICD-10-CM | POA: Diagnosis not present

## 2019-06-30 DIAGNOSIS — R1310 Dysphagia, unspecified: Secondary | ICD-10-CM | POA: Diagnosis not present

## 2019-06-30 DIAGNOSIS — R12 Heartburn: Secondary | ICD-10-CM | POA: Diagnosis not present

## 2019-06-30 DIAGNOSIS — M170 Bilateral primary osteoarthritis of knee: Secondary | ICD-10-CM | POA: Diagnosis not present

## 2019-07-01 DIAGNOSIS — M170 Bilateral primary osteoarthritis of knee: Secondary | ICD-10-CM | POA: Diagnosis not present

## 2019-07-02 DIAGNOSIS — H524 Presbyopia: Secondary | ICD-10-CM | POA: Diagnosis not present

## 2019-07-02 DIAGNOSIS — M170 Bilateral primary osteoarthritis of knee: Secondary | ICD-10-CM | POA: Diagnosis not present

## 2019-07-03 DIAGNOSIS — M170 Bilateral primary osteoarthritis of knee: Secondary | ICD-10-CM | POA: Diagnosis not present

## 2019-07-04 DIAGNOSIS — M170 Bilateral primary osteoarthritis of knee: Secondary | ICD-10-CM | POA: Diagnosis not present

## 2019-07-05 DIAGNOSIS — M170 Bilateral primary osteoarthritis of knee: Secondary | ICD-10-CM | POA: Diagnosis not present

## 2019-07-06 DIAGNOSIS — M170 Bilateral primary osteoarthritis of knee: Secondary | ICD-10-CM | POA: Diagnosis not present

## 2019-07-07 DIAGNOSIS — M170 Bilateral primary osteoarthritis of knee: Secondary | ICD-10-CM | POA: Diagnosis not present

## 2019-07-08 DIAGNOSIS — M170 Bilateral primary osteoarthritis of knee: Secondary | ICD-10-CM | POA: Diagnosis not present

## 2019-07-09 DIAGNOSIS — M170 Bilateral primary osteoarthritis of knee: Secondary | ICD-10-CM | POA: Diagnosis not present

## 2019-07-10 DIAGNOSIS — Z79899 Other long term (current) drug therapy: Secondary | ICD-10-CM | POA: Diagnosis not present

## 2019-07-10 DIAGNOSIS — Z7982 Long term (current) use of aspirin: Secondary | ICD-10-CM | POA: Diagnosis not present

## 2019-07-10 DIAGNOSIS — M170 Bilateral primary osteoarthritis of knee: Secondary | ICD-10-CM | POA: Diagnosis not present

## 2019-07-10 DIAGNOSIS — Z87891 Personal history of nicotine dependence: Secondary | ICD-10-CM | POA: Diagnosis not present

## 2019-07-10 DIAGNOSIS — F329 Major depressive disorder, single episode, unspecified: Secondary | ICD-10-CM | POA: Diagnosis not present

## 2019-07-10 DIAGNOSIS — R079 Chest pain, unspecified: Secondary | ICD-10-CM | POA: Diagnosis not present

## 2019-07-10 DIAGNOSIS — I10 Essential (primary) hypertension: Secondary | ICD-10-CM | POA: Diagnosis not present

## 2019-07-10 DIAGNOSIS — Z21 Asymptomatic human immunodeficiency virus [HIV] infection status: Secondary | ICD-10-CM | POA: Diagnosis not present

## 2019-07-10 DIAGNOSIS — Z86711 Personal history of pulmonary embolism: Secondary | ICD-10-CM | POA: Diagnosis not present

## 2019-07-10 DIAGNOSIS — R1013 Epigastric pain: Secondary | ICD-10-CM | POA: Diagnosis not present

## 2019-07-10 DIAGNOSIS — Z86718 Personal history of other venous thrombosis and embolism: Secondary | ICD-10-CM | POA: Diagnosis not present

## 2019-07-10 DIAGNOSIS — K219 Gastro-esophageal reflux disease without esophagitis: Secondary | ICD-10-CM | POA: Diagnosis not present

## 2019-07-10 DIAGNOSIS — R1110 Vomiting, unspecified: Secondary | ICD-10-CM | POA: Diagnosis not present

## 2019-07-10 DIAGNOSIS — J45909 Unspecified asthma, uncomplicated: Secondary | ICD-10-CM | POA: Diagnosis not present

## 2019-07-10 DIAGNOSIS — K2950 Unspecified chronic gastritis without bleeding: Secondary | ICD-10-CM | POA: Diagnosis not present

## 2019-07-11 DIAGNOSIS — M170 Bilateral primary osteoarthritis of knee: Secondary | ICD-10-CM | POA: Diagnosis not present

## 2019-07-12 DIAGNOSIS — H938X3 Other specified disorders of ear, bilateral: Secondary | ICD-10-CM | POA: Diagnosis not present

## 2019-07-12 DIAGNOSIS — M170 Bilateral primary osteoarthritis of knee: Secondary | ICD-10-CM | POA: Diagnosis not present

## 2019-07-12 DIAGNOSIS — Z87891 Personal history of nicotine dependence: Secondary | ICD-10-CM | POA: Diagnosis not present

## 2019-07-12 DIAGNOSIS — R0981 Nasal congestion: Secondary | ICD-10-CM | POA: Diagnosis not present

## 2019-07-12 DIAGNOSIS — J3489 Other specified disorders of nose and nasal sinuses: Secondary | ICD-10-CM | POA: Diagnosis not present

## 2019-07-12 DIAGNOSIS — Z20822 Contact with and (suspected) exposure to covid-19: Secondary | ICD-10-CM | POA: Diagnosis not present

## 2019-07-13 DIAGNOSIS — M170 Bilateral primary osteoarthritis of knee: Secondary | ICD-10-CM | POA: Diagnosis not present

## 2019-07-14 DIAGNOSIS — M170 Bilateral primary osteoarthritis of knee: Secondary | ICD-10-CM | POA: Diagnosis not present

## 2019-07-15 DIAGNOSIS — M170 Bilateral primary osteoarthritis of knee: Secondary | ICD-10-CM | POA: Diagnosis not present

## 2019-07-16 DIAGNOSIS — M170 Bilateral primary osteoarthritis of knee: Secondary | ICD-10-CM | POA: Diagnosis not present

## 2019-07-17 DIAGNOSIS — M170 Bilateral primary osteoarthritis of knee: Secondary | ICD-10-CM | POA: Diagnosis not present

## 2019-07-18 DIAGNOSIS — M170 Bilateral primary osteoarthritis of knee: Secondary | ICD-10-CM | POA: Diagnosis not present

## 2019-07-19 DIAGNOSIS — M170 Bilateral primary osteoarthritis of knee: Secondary | ICD-10-CM | POA: Diagnosis not present

## 2019-07-20 DIAGNOSIS — M170 Bilateral primary osteoarthritis of knee: Secondary | ICD-10-CM | POA: Diagnosis not present

## 2019-07-21 DIAGNOSIS — M170 Bilateral primary osteoarthritis of knee: Secondary | ICD-10-CM | POA: Diagnosis not present

## 2019-07-22 DIAGNOSIS — M170 Bilateral primary osteoarthritis of knee: Secondary | ICD-10-CM | POA: Diagnosis not present

## 2019-07-23 DIAGNOSIS — M170 Bilateral primary osteoarthritis of knee: Secondary | ICD-10-CM | POA: Diagnosis not present

## 2019-07-24 DIAGNOSIS — M170 Bilateral primary osteoarthritis of knee: Secondary | ICD-10-CM | POA: Diagnosis not present

## 2019-07-25 DIAGNOSIS — M170 Bilateral primary osteoarthritis of knee: Secondary | ICD-10-CM | POA: Diagnosis not present

## 2019-07-26 DIAGNOSIS — M170 Bilateral primary osteoarthritis of knee: Secondary | ICD-10-CM | POA: Diagnosis not present

## 2019-07-27 DIAGNOSIS — M170 Bilateral primary osteoarthritis of knee: Secondary | ICD-10-CM | POA: Diagnosis not present

## 2019-07-28 DIAGNOSIS — M170 Bilateral primary osteoarthritis of knee: Secondary | ICD-10-CM | POA: Diagnosis not present

## 2019-07-29 DIAGNOSIS — M170 Bilateral primary osteoarthritis of knee: Secondary | ICD-10-CM | POA: Diagnosis not present

## 2019-07-30 DIAGNOSIS — M170 Bilateral primary osteoarthritis of knee: Secondary | ICD-10-CM | POA: Diagnosis not present

## 2019-07-31 DIAGNOSIS — M170 Bilateral primary osteoarthritis of knee: Secondary | ICD-10-CM | POA: Diagnosis not present

## 2019-08-01 DIAGNOSIS — M170 Bilateral primary osteoarthritis of knee: Secondary | ICD-10-CM | POA: Diagnosis not present

## 2019-08-02 DIAGNOSIS — M170 Bilateral primary osteoarthritis of knee: Secondary | ICD-10-CM | POA: Diagnosis not present

## 2019-08-03 DIAGNOSIS — M170 Bilateral primary osteoarthritis of knee: Secondary | ICD-10-CM | POA: Diagnosis not present

## 2019-08-04 DIAGNOSIS — M170 Bilateral primary osteoarthritis of knee: Secondary | ICD-10-CM | POA: Diagnosis not present

## 2019-08-05 DIAGNOSIS — M170 Bilateral primary osteoarthritis of knee: Secondary | ICD-10-CM | POA: Diagnosis not present

## 2019-08-06 DIAGNOSIS — M170 Bilateral primary osteoarthritis of knee: Secondary | ICD-10-CM | POA: Diagnosis not present

## 2019-08-07 DIAGNOSIS — M170 Bilateral primary osteoarthritis of knee: Secondary | ICD-10-CM | POA: Diagnosis not present

## 2019-08-08 DIAGNOSIS — Z20822 Contact with and (suspected) exposure to covid-19: Secondary | ICD-10-CM | POA: Diagnosis not present

## 2019-08-08 DIAGNOSIS — M170 Bilateral primary osteoarthritis of knee: Secondary | ICD-10-CM | POA: Diagnosis not present

## 2019-08-08 DIAGNOSIS — Z01812 Encounter for preprocedural laboratory examination: Secondary | ICD-10-CM | POA: Diagnosis not present

## 2019-08-09 DIAGNOSIS — M170 Bilateral primary osteoarthritis of knee: Secondary | ICD-10-CM | POA: Diagnosis not present

## 2019-08-10 DIAGNOSIS — Z880 Allergy status to penicillin: Secondary | ICD-10-CM | POA: Diagnosis not present

## 2019-08-10 DIAGNOSIS — K3189 Other diseases of stomach and duodenum: Secondary | ICD-10-CM | POA: Diagnosis not present

## 2019-08-10 DIAGNOSIS — K449 Diaphragmatic hernia without obstruction or gangrene: Secondary | ICD-10-CM | POA: Diagnosis not present

## 2019-08-10 DIAGNOSIS — J45909 Unspecified asthma, uncomplicated: Secondary | ICD-10-CM | POA: Diagnosis not present

## 2019-08-10 DIAGNOSIS — Z87891 Personal history of nicotine dependence: Secondary | ICD-10-CM | POA: Diagnosis not present

## 2019-08-10 DIAGNOSIS — R1312 Dysphagia, oropharyngeal phase: Secondary | ICD-10-CM | POA: Diagnosis not present

## 2019-08-10 DIAGNOSIS — Z885 Allergy status to narcotic agent status: Secondary | ICD-10-CM | POA: Diagnosis not present

## 2019-08-10 DIAGNOSIS — Z86718 Personal history of other venous thrombosis and embolism: Secondary | ICD-10-CM | POA: Diagnosis not present

## 2019-08-10 DIAGNOSIS — K219 Gastro-esophageal reflux disease without esophagitis: Secondary | ICD-10-CM | POA: Diagnosis not present

## 2019-08-10 DIAGNOSIS — M170 Bilateral primary osteoarthritis of knee: Secondary | ICD-10-CM | POA: Diagnosis not present

## 2019-08-10 DIAGNOSIS — Z6837 Body mass index (BMI) 37.0-37.9, adult: Secondary | ICD-10-CM | POA: Diagnosis not present

## 2019-08-10 DIAGNOSIS — K319 Disease of stomach and duodenum, unspecified: Secondary | ICD-10-CM | POA: Diagnosis not present

## 2019-08-10 DIAGNOSIS — I10 Essential (primary) hypertension: Secondary | ICD-10-CM | POA: Diagnosis not present

## 2019-08-10 DIAGNOSIS — Z9049 Acquired absence of other specified parts of digestive tract: Secondary | ICD-10-CM | POA: Diagnosis not present

## 2019-08-11 DIAGNOSIS — M170 Bilateral primary osteoarthritis of knee: Secondary | ICD-10-CM | POA: Diagnosis not present

## 2019-08-12 DIAGNOSIS — M170 Bilateral primary osteoarthritis of knee: Secondary | ICD-10-CM | POA: Diagnosis not present

## 2019-08-13 DIAGNOSIS — M170 Bilateral primary osteoarthritis of knee: Secondary | ICD-10-CM | POA: Diagnosis not present

## 2019-08-13 DIAGNOSIS — H109 Unspecified conjunctivitis: Secondary | ICD-10-CM | POA: Diagnosis not present

## 2019-08-14 DIAGNOSIS — M1711 Unilateral primary osteoarthritis, right knee: Secondary | ICD-10-CM | POA: Diagnosis not present

## 2019-08-14 DIAGNOSIS — Z1231 Encounter for screening mammogram for malignant neoplasm of breast: Secondary | ICD-10-CM | POA: Diagnosis not present

## 2019-08-14 DIAGNOSIS — R739 Hyperglycemia, unspecified: Secondary | ICD-10-CM | POA: Diagnosis not present

## 2019-08-14 DIAGNOSIS — M170 Bilateral primary osteoarthritis of knee: Secondary | ICD-10-CM | POA: Diagnosis not present

## 2019-08-14 DIAGNOSIS — Z1211 Encounter for screening for malignant neoplasm of colon: Secondary | ICD-10-CM | POA: Diagnosis not present

## 2019-08-15 DIAGNOSIS — M170 Bilateral primary osteoarthritis of knee: Secondary | ICD-10-CM | POA: Diagnosis not present

## 2019-08-16 DIAGNOSIS — M170 Bilateral primary osteoarthritis of knee: Secondary | ICD-10-CM | POA: Diagnosis not present

## 2019-08-17 DIAGNOSIS — M170 Bilateral primary osteoarthritis of knee: Secondary | ICD-10-CM | POA: Diagnosis not present

## 2019-08-18 DIAGNOSIS — M170 Bilateral primary osteoarthritis of knee: Secondary | ICD-10-CM | POA: Diagnosis not present

## 2019-08-19 DIAGNOSIS — M170 Bilateral primary osteoarthritis of knee: Secondary | ICD-10-CM | POA: Diagnosis not present

## 2019-08-20 DIAGNOSIS — M170 Bilateral primary osteoarthritis of knee: Secondary | ICD-10-CM | POA: Diagnosis not present

## 2019-08-21 DIAGNOSIS — M170 Bilateral primary osteoarthritis of knee: Secondary | ICD-10-CM | POA: Diagnosis not present

## 2019-08-22 DIAGNOSIS — M170 Bilateral primary osteoarthritis of knee: Secondary | ICD-10-CM | POA: Diagnosis not present

## 2019-08-23 DIAGNOSIS — M170 Bilateral primary osteoarthritis of knee: Secondary | ICD-10-CM | POA: Diagnosis not present

## 2019-08-24 DIAGNOSIS — M170 Bilateral primary osteoarthritis of knee: Secondary | ICD-10-CM | POA: Diagnosis not present

## 2019-08-25 DIAGNOSIS — M170 Bilateral primary osteoarthritis of knee: Secondary | ICD-10-CM | POA: Diagnosis not present

## 2019-08-26 DIAGNOSIS — M170 Bilateral primary osteoarthritis of knee: Secondary | ICD-10-CM | POA: Diagnosis not present

## 2019-08-27 DIAGNOSIS — M170 Bilateral primary osteoarthritis of knee: Secondary | ICD-10-CM | POA: Diagnosis not present

## 2019-08-28 DIAGNOSIS — M170 Bilateral primary osteoarthritis of knee: Secondary | ICD-10-CM | POA: Diagnosis not present

## 2019-08-29 DIAGNOSIS — M170 Bilateral primary osteoarthritis of knee: Secondary | ICD-10-CM | POA: Diagnosis not present

## 2019-08-30 DIAGNOSIS — M170 Bilateral primary osteoarthritis of knee: Secondary | ICD-10-CM | POA: Diagnosis not present

## 2019-08-31 DIAGNOSIS — M170 Bilateral primary osteoarthritis of knee: Secondary | ICD-10-CM | POA: Diagnosis not present

## 2019-09-01 DIAGNOSIS — M170 Bilateral primary osteoarthritis of knee: Secondary | ICD-10-CM | POA: Diagnosis not present

## 2019-09-02 DIAGNOSIS — M170 Bilateral primary osteoarthritis of knee: Secondary | ICD-10-CM | POA: Diagnosis not present

## 2019-09-03 DIAGNOSIS — M170 Bilateral primary osteoarthritis of knee: Secondary | ICD-10-CM | POA: Diagnosis not present

## 2019-09-04 DIAGNOSIS — M170 Bilateral primary osteoarthritis of knee: Secondary | ICD-10-CM | POA: Diagnosis not present

## 2019-09-05 DIAGNOSIS — M170 Bilateral primary osteoarthritis of knee: Secondary | ICD-10-CM | POA: Diagnosis not present

## 2019-09-06 DIAGNOSIS — M170 Bilateral primary osteoarthritis of knee: Secondary | ICD-10-CM | POA: Diagnosis not present

## 2019-09-07 DIAGNOSIS — M170 Bilateral primary osteoarthritis of knee: Secondary | ICD-10-CM | POA: Diagnosis not present

## 2019-09-08 DIAGNOSIS — M170 Bilateral primary osteoarthritis of knee: Secondary | ICD-10-CM | POA: Diagnosis not present

## 2019-09-09 DIAGNOSIS — M170 Bilateral primary osteoarthritis of knee: Secondary | ICD-10-CM | POA: Diagnosis not present

## 2019-09-10 DIAGNOSIS — M170 Bilateral primary osteoarthritis of knee: Secondary | ICD-10-CM | POA: Diagnosis not present

## 2019-09-11 DIAGNOSIS — M170 Bilateral primary osteoarthritis of knee: Secondary | ICD-10-CM | POA: Diagnosis not present

## 2019-09-12 DIAGNOSIS — M170 Bilateral primary osteoarthritis of knee: Secondary | ICD-10-CM | POA: Diagnosis not present

## 2019-09-13 DIAGNOSIS — M170 Bilateral primary osteoarthritis of knee: Secondary | ICD-10-CM | POA: Diagnosis not present

## 2019-09-14 DIAGNOSIS — M170 Bilateral primary osteoarthritis of knee: Secondary | ICD-10-CM | POA: Diagnosis not present

## 2019-09-15 DIAGNOSIS — M170 Bilateral primary osteoarthritis of knee: Secondary | ICD-10-CM | POA: Diagnosis not present

## 2019-09-16 DIAGNOSIS — M170 Bilateral primary osteoarthritis of knee: Secondary | ICD-10-CM | POA: Diagnosis not present

## 2019-09-17 DIAGNOSIS — M170 Bilateral primary osteoarthritis of knee: Secondary | ICD-10-CM | POA: Diagnosis not present

## 2019-09-18 DIAGNOSIS — M170 Bilateral primary osteoarthritis of knee: Secondary | ICD-10-CM | POA: Diagnosis not present

## 2019-09-20 DIAGNOSIS — M170 Bilateral primary osteoarthritis of knee: Secondary | ICD-10-CM | POA: Diagnosis not present

## 2019-09-21 DIAGNOSIS — M170 Bilateral primary osteoarthritis of knee: Secondary | ICD-10-CM | POA: Diagnosis not present

## 2019-09-22 DIAGNOSIS — M170 Bilateral primary osteoarthritis of knee: Secondary | ICD-10-CM | POA: Diagnosis not present

## 2019-09-23 DIAGNOSIS — M170 Bilateral primary osteoarthritis of knee: Secondary | ICD-10-CM | POA: Diagnosis not present

## 2019-09-24 DIAGNOSIS — M170 Bilateral primary osteoarthritis of knee: Secondary | ICD-10-CM | POA: Diagnosis not present

## 2019-09-25 DIAGNOSIS — M170 Bilateral primary osteoarthritis of knee: Secondary | ICD-10-CM | POA: Diagnosis not present

## 2019-09-26 DIAGNOSIS — M170 Bilateral primary osteoarthritis of knee: Secondary | ICD-10-CM | POA: Diagnosis not present

## 2019-09-27 DIAGNOSIS — M170 Bilateral primary osteoarthritis of knee: Secondary | ICD-10-CM | POA: Diagnosis not present

## 2019-09-28 DIAGNOSIS — M170 Bilateral primary osteoarthritis of knee: Secondary | ICD-10-CM | POA: Diagnosis not present

## 2019-09-29 DIAGNOSIS — M170 Bilateral primary osteoarthritis of knee: Secondary | ICD-10-CM | POA: Diagnosis not present

## 2019-09-30 DIAGNOSIS — M170 Bilateral primary osteoarthritis of knee: Secondary | ICD-10-CM | POA: Diagnosis not present

## 2019-10-01 DIAGNOSIS — M170 Bilateral primary osteoarthritis of knee: Secondary | ICD-10-CM | POA: Diagnosis not present

## 2019-10-02 DIAGNOSIS — M170 Bilateral primary osteoarthritis of knee: Secondary | ICD-10-CM | POA: Diagnosis not present

## 2019-10-04 DIAGNOSIS — M170 Bilateral primary osteoarthritis of knee: Secondary | ICD-10-CM | POA: Diagnosis not present

## 2019-10-05 DIAGNOSIS — M170 Bilateral primary osteoarthritis of knee: Secondary | ICD-10-CM | POA: Diagnosis not present

## 2019-10-06 DIAGNOSIS — M170 Bilateral primary osteoarthritis of knee: Secondary | ICD-10-CM | POA: Diagnosis not present

## 2019-10-07 DIAGNOSIS — M170 Bilateral primary osteoarthritis of knee: Secondary | ICD-10-CM | POA: Diagnosis not present

## 2019-10-08 DIAGNOSIS — M170 Bilateral primary osteoarthritis of knee: Secondary | ICD-10-CM | POA: Diagnosis not present

## 2019-10-09 DIAGNOSIS — M170 Bilateral primary osteoarthritis of knee: Secondary | ICD-10-CM | POA: Diagnosis not present

## 2019-10-10 DIAGNOSIS — M170 Bilateral primary osteoarthritis of knee: Secondary | ICD-10-CM | POA: Diagnosis not present

## 2019-10-11 DIAGNOSIS — M170 Bilateral primary osteoarthritis of knee: Secondary | ICD-10-CM | POA: Diagnosis not present

## 2019-10-13 DIAGNOSIS — M170 Bilateral primary osteoarthritis of knee: Secondary | ICD-10-CM | POA: Diagnosis not present

## 2019-10-14 DIAGNOSIS — M170 Bilateral primary osteoarthritis of knee: Secondary | ICD-10-CM | POA: Diagnosis not present

## 2019-10-15 DIAGNOSIS — M170 Bilateral primary osteoarthritis of knee: Secondary | ICD-10-CM | POA: Diagnosis not present

## 2019-10-16 DIAGNOSIS — M170 Bilateral primary osteoarthritis of knee: Secondary | ICD-10-CM | POA: Diagnosis not present

## 2019-10-17 DIAGNOSIS — M170 Bilateral primary osteoarthritis of knee: Secondary | ICD-10-CM | POA: Diagnosis not present

## 2019-10-18 DIAGNOSIS — M170 Bilateral primary osteoarthritis of knee: Secondary | ICD-10-CM | POA: Diagnosis not present

## 2019-10-19 DIAGNOSIS — M170 Bilateral primary osteoarthritis of knee: Secondary | ICD-10-CM | POA: Diagnosis not present

## 2019-10-20 DIAGNOSIS — M170 Bilateral primary osteoarthritis of knee: Secondary | ICD-10-CM | POA: Diagnosis not present

## 2019-10-21 DIAGNOSIS — M170 Bilateral primary osteoarthritis of knee: Secondary | ICD-10-CM | POA: Diagnosis not present

## 2019-10-22 DIAGNOSIS — M170 Bilateral primary osteoarthritis of knee: Secondary | ICD-10-CM | POA: Diagnosis not present

## 2019-10-23 DIAGNOSIS — M170 Bilateral primary osteoarthritis of knee: Secondary | ICD-10-CM | POA: Diagnosis not present

## 2019-10-24 DIAGNOSIS — M170 Bilateral primary osteoarthritis of knee: Secondary | ICD-10-CM | POA: Diagnosis not present

## 2019-10-25 DIAGNOSIS — M170 Bilateral primary osteoarthritis of knee: Secondary | ICD-10-CM | POA: Diagnosis not present

## 2019-10-26 DIAGNOSIS — M170 Bilateral primary osteoarthritis of knee: Secondary | ICD-10-CM | POA: Diagnosis not present

## 2019-10-27 DIAGNOSIS — M170 Bilateral primary osteoarthritis of knee: Secondary | ICD-10-CM | POA: Diagnosis not present

## 2019-10-28 DIAGNOSIS — M170 Bilateral primary osteoarthritis of knee: Secondary | ICD-10-CM | POA: Diagnosis not present

## 2019-10-29 DIAGNOSIS — M170 Bilateral primary osteoarthritis of knee: Secondary | ICD-10-CM | POA: Diagnosis not present

## 2019-10-30 DIAGNOSIS — M170 Bilateral primary osteoarthritis of knee: Secondary | ICD-10-CM | POA: Diagnosis not present

## 2019-10-31 DIAGNOSIS — M170 Bilateral primary osteoarthritis of knee: Secondary | ICD-10-CM | POA: Diagnosis not present

## 2019-11-01 DIAGNOSIS — M170 Bilateral primary osteoarthritis of knee: Secondary | ICD-10-CM | POA: Diagnosis not present

## 2019-11-02 DIAGNOSIS — M170 Bilateral primary osteoarthritis of knee: Secondary | ICD-10-CM | POA: Diagnosis not present

## 2019-11-03 DIAGNOSIS — M170 Bilateral primary osteoarthritis of knee: Secondary | ICD-10-CM | POA: Diagnosis not present

## 2019-11-04 DIAGNOSIS — M170 Bilateral primary osteoarthritis of knee: Secondary | ICD-10-CM | POA: Diagnosis not present

## 2019-11-05 DIAGNOSIS — M170 Bilateral primary osteoarthritis of knee: Secondary | ICD-10-CM | POA: Diagnosis not present

## 2019-11-06 DIAGNOSIS — M170 Bilateral primary osteoarthritis of knee: Secondary | ICD-10-CM | POA: Diagnosis not present

## 2019-11-07 DIAGNOSIS — M170 Bilateral primary osteoarthritis of knee: Secondary | ICD-10-CM | POA: Diagnosis not present

## 2019-11-08 DIAGNOSIS — M170 Bilateral primary osteoarthritis of knee: Secondary | ICD-10-CM | POA: Diagnosis not present

## 2019-11-09 DIAGNOSIS — M170 Bilateral primary osteoarthritis of knee: Secondary | ICD-10-CM | POA: Diagnosis not present

## 2019-11-10 DIAGNOSIS — M170 Bilateral primary osteoarthritis of knee: Secondary | ICD-10-CM | POA: Diagnosis not present

## 2019-11-11 DIAGNOSIS — J45909 Unspecified asthma, uncomplicated: Secondary | ICD-10-CM | POA: Diagnosis not present

## 2019-11-11 DIAGNOSIS — Z87891 Personal history of nicotine dependence: Secondary | ICD-10-CM | POA: Diagnosis not present

## 2019-11-11 DIAGNOSIS — M170 Bilateral primary osteoarthritis of knee: Secondary | ICD-10-CM | POA: Diagnosis not present

## 2019-11-11 DIAGNOSIS — Z86711 Personal history of pulmonary embolism: Secondary | ICD-10-CM | POA: Diagnosis not present

## 2019-11-11 DIAGNOSIS — Z21 Asymptomatic human immunodeficiency virus [HIV] infection status: Secondary | ICD-10-CM | POA: Diagnosis not present

## 2019-11-11 DIAGNOSIS — R509 Fever, unspecified: Secondary | ICD-10-CM | POA: Diagnosis not present

## 2019-11-11 DIAGNOSIS — R932 Abnormal findings on diagnostic imaging of liver and biliary tract: Secondary | ICD-10-CM | POA: Diagnosis not present

## 2019-11-11 DIAGNOSIS — K219 Gastro-esophageal reflux disease without esophagitis: Secondary | ICD-10-CM | POA: Diagnosis not present

## 2019-11-11 DIAGNOSIS — R300 Dysuria: Secondary | ICD-10-CM | POA: Diagnosis not present

## 2019-11-11 DIAGNOSIS — R9341 Abnormal radiologic findings on diagnostic imaging of renal pelvis, ureter, or bladder: Secondary | ICD-10-CM | POA: Diagnosis not present

## 2019-11-11 DIAGNOSIS — K769 Liver disease, unspecified: Secondary | ICD-10-CM | POA: Diagnosis not present

## 2019-11-11 DIAGNOSIS — R110 Nausea: Secondary | ICD-10-CM | POA: Diagnosis not present

## 2019-11-11 DIAGNOSIS — Z79899 Other long term (current) drug therapy: Secondary | ICD-10-CM | POA: Diagnosis not present

## 2019-11-11 DIAGNOSIS — N3001 Acute cystitis with hematuria: Secondary | ICD-10-CM | POA: Diagnosis not present

## 2019-11-11 DIAGNOSIS — F418 Other specified anxiety disorders: Secondary | ICD-10-CM | POA: Diagnosis not present

## 2019-11-11 DIAGNOSIS — R109 Unspecified abdominal pain: Secondary | ICD-10-CM | POA: Diagnosis not present

## 2019-11-11 DIAGNOSIS — Z86718 Personal history of other venous thrombosis and embolism: Secondary | ICD-10-CM | POA: Diagnosis not present

## 2019-11-11 DIAGNOSIS — B1920 Unspecified viral hepatitis C without hepatic coma: Secondary | ICD-10-CM | POA: Diagnosis not present

## 2019-11-12 DIAGNOSIS — M170 Bilateral primary osteoarthritis of knee: Secondary | ICD-10-CM | POA: Diagnosis not present

## 2019-11-13 DIAGNOSIS — M170 Bilateral primary osteoarthritis of knee: Secondary | ICD-10-CM | POA: Diagnosis not present

## 2019-11-14 DIAGNOSIS — M170 Bilateral primary osteoarthritis of knee: Secondary | ICD-10-CM | POA: Diagnosis not present

## 2019-11-15 DIAGNOSIS — M170 Bilateral primary osteoarthritis of knee: Secondary | ICD-10-CM | POA: Diagnosis not present

## 2019-11-16 DIAGNOSIS — M170 Bilateral primary osteoarthritis of knee: Secondary | ICD-10-CM | POA: Diagnosis not present

## 2019-11-17 DIAGNOSIS — M170 Bilateral primary osteoarthritis of knee: Secondary | ICD-10-CM | POA: Diagnosis not present

## 2019-11-18 DIAGNOSIS — M170 Bilateral primary osteoarthritis of knee: Secondary | ICD-10-CM | POA: Diagnosis not present

## 2019-11-19 DIAGNOSIS — M170 Bilateral primary osteoarthritis of knee: Secondary | ICD-10-CM | POA: Diagnosis not present

## 2019-11-20 DIAGNOSIS — M170 Bilateral primary osteoarthritis of knee: Secondary | ICD-10-CM | POA: Diagnosis not present

## 2019-11-21 DIAGNOSIS — M170 Bilateral primary osteoarthritis of knee: Secondary | ICD-10-CM | POA: Diagnosis not present

## 2019-11-22 DIAGNOSIS — M170 Bilateral primary osteoarthritis of knee: Secondary | ICD-10-CM | POA: Diagnosis not present

## 2019-11-23 DIAGNOSIS — M170 Bilateral primary osteoarthritis of knee: Secondary | ICD-10-CM | POA: Diagnosis not present

## 2019-11-24 DIAGNOSIS — M170 Bilateral primary osteoarthritis of knee: Secondary | ICD-10-CM | POA: Diagnosis not present

## 2019-11-25 DIAGNOSIS — M170 Bilateral primary osteoarthritis of knee: Secondary | ICD-10-CM | POA: Diagnosis not present

## 2019-11-26 DIAGNOSIS — M170 Bilateral primary osteoarthritis of knee: Secondary | ICD-10-CM | POA: Diagnosis not present

## 2019-11-27 DIAGNOSIS — M170 Bilateral primary osteoarthritis of knee: Secondary | ICD-10-CM | POA: Diagnosis not present

## 2019-11-28 DIAGNOSIS — M170 Bilateral primary osteoarthritis of knee: Secondary | ICD-10-CM | POA: Diagnosis not present

## 2019-11-29 DIAGNOSIS — M170 Bilateral primary osteoarthritis of knee: Secondary | ICD-10-CM | POA: Diagnosis not present

## 2019-11-30 DIAGNOSIS — M170 Bilateral primary osteoarthritis of knee: Secondary | ICD-10-CM | POA: Diagnosis not present

## 2019-12-01 DIAGNOSIS — M170 Bilateral primary osteoarthritis of knee: Secondary | ICD-10-CM | POA: Diagnosis not present

## 2019-12-02 DIAGNOSIS — M170 Bilateral primary osteoarthritis of knee: Secondary | ICD-10-CM | POA: Diagnosis not present

## 2019-12-03 DIAGNOSIS — M170 Bilateral primary osteoarthritis of knee: Secondary | ICD-10-CM | POA: Diagnosis not present

## 2019-12-04 DIAGNOSIS — M170 Bilateral primary osteoarthritis of knee: Secondary | ICD-10-CM | POA: Diagnosis not present

## 2019-12-05 DIAGNOSIS — M170 Bilateral primary osteoarthritis of knee: Secondary | ICD-10-CM | POA: Diagnosis not present

## 2019-12-06 DIAGNOSIS — M170 Bilateral primary osteoarthritis of knee: Secondary | ICD-10-CM | POA: Diagnosis not present

## 2019-12-07 DIAGNOSIS — R12 Heartburn: Secondary | ICD-10-CM | POA: Diagnosis not present

## 2019-12-07 DIAGNOSIS — B20 Human immunodeficiency virus [HIV] disease: Secondary | ICD-10-CM | POA: Diagnosis not present

## 2019-12-07 DIAGNOSIS — M170 Bilateral primary osteoarthritis of knee: Secondary | ICD-10-CM | POA: Diagnosis not present

## 2019-12-07 DIAGNOSIS — Z23 Encounter for immunization: Secondary | ICD-10-CM | POA: Diagnosis not present

## 2019-12-08 DIAGNOSIS — M170 Bilateral primary osteoarthritis of knee: Secondary | ICD-10-CM | POA: Diagnosis not present

## 2019-12-09 DIAGNOSIS — M170 Bilateral primary osteoarthritis of knee: Secondary | ICD-10-CM | POA: Diagnosis not present

## 2019-12-10 DIAGNOSIS — M170 Bilateral primary osteoarthritis of knee: Secondary | ICD-10-CM | POA: Diagnosis not present

## 2019-12-11 DIAGNOSIS — M170 Bilateral primary osteoarthritis of knee: Secondary | ICD-10-CM | POA: Diagnosis not present

## 2019-12-12 DIAGNOSIS — M170 Bilateral primary osteoarthritis of knee: Secondary | ICD-10-CM | POA: Diagnosis not present

## 2019-12-13 DIAGNOSIS — M170 Bilateral primary osteoarthritis of knee: Secondary | ICD-10-CM | POA: Diagnosis not present

## 2019-12-14 DIAGNOSIS — M170 Bilateral primary osteoarthritis of knee: Secondary | ICD-10-CM | POA: Diagnosis not present

## 2019-12-15 DIAGNOSIS — M170 Bilateral primary osteoarthritis of knee: Secondary | ICD-10-CM | POA: Diagnosis not present

## 2019-12-16 DIAGNOSIS — M170 Bilateral primary osteoarthritis of knee: Secondary | ICD-10-CM | POA: Diagnosis not present

## 2019-12-17 DIAGNOSIS — M170 Bilateral primary osteoarthritis of knee: Secondary | ICD-10-CM | POA: Diagnosis not present

## 2019-12-18 DIAGNOSIS — M170 Bilateral primary osteoarthritis of knee: Secondary | ICD-10-CM | POA: Diagnosis not present

## 2019-12-19 DIAGNOSIS — M170 Bilateral primary osteoarthritis of knee: Secondary | ICD-10-CM | POA: Diagnosis not present

## 2019-12-20 DIAGNOSIS — M170 Bilateral primary osteoarthritis of knee: Secondary | ICD-10-CM | POA: Diagnosis not present

## 2019-12-21 DIAGNOSIS — M170 Bilateral primary osteoarthritis of knee: Secondary | ICD-10-CM | POA: Diagnosis not present

## 2019-12-22 DIAGNOSIS — M170 Bilateral primary osteoarthritis of knee: Secondary | ICD-10-CM | POA: Diagnosis not present

## 2019-12-23 DIAGNOSIS — M170 Bilateral primary osteoarthritis of knee: Secondary | ICD-10-CM | POA: Diagnosis not present

## 2019-12-24 DIAGNOSIS — M170 Bilateral primary osteoarthritis of knee: Secondary | ICD-10-CM | POA: Diagnosis not present

## 2019-12-25 DIAGNOSIS — M170 Bilateral primary osteoarthritis of knee: Secondary | ICD-10-CM | POA: Diagnosis not present

## 2019-12-26 DIAGNOSIS — M170 Bilateral primary osteoarthritis of knee: Secondary | ICD-10-CM | POA: Diagnosis not present

## 2019-12-27 DIAGNOSIS — M170 Bilateral primary osteoarthritis of knee: Secondary | ICD-10-CM | POA: Diagnosis not present

## 2019-12-28 DIAGNOSIS — M170 Bilateral primary osteoarthritis of knee: Secondary | ICD-10-CM | POA: Diagnosis not present

## 2019-12-29 DIAGNOSIS — M170 Bilateral primary osteoarthritis of knee: Secondary | ICD-10-CM | POA: Diagnosis not present

## 2019-12-30 DIAGNOSIS — M170 Bilateral primary osteoarthritis of knee: Secondary | ICD-10-CM | POA: Diagnosis not present

## 2019-12-31 DIAGNOSIS — M170 Bilateral primary osteoarthritis of knee: Secondary | ICD-10-CM | POA: Diagnosis not present

## 2020-01-01 DIAGNOSIS — M170 Bilateral primary osteoarthritis of knee: Secondary | ICD-10-CM | POA: Diagnosis not present

## 2020-01-02 DIAGNOSIS — M170 Bilateral primary osteoarthritis of knee: Secondary | ICD-10-CM | POA: Diagnosis not present

## 2020-01-03 DIAGNOSIS — M170 Bilateral primary osteoarthritis of knee: Secondary | ICD-10-CM | POA: Diagnosis not present

## 2020-01-04 DIAGNOSIS — M170 Bilateral primary osteoarthritis of knee: Secondary | ICD-10-CM | POA: Diagnosis not present

## 2020-01-05 DIAGNOSIS — M170 Bilateral primary osteoarthritis of knee: Secondary | ICD-10-CM | POA: Diagnosis not present

## 2020-01-06 DIAGNOSIS — M170 Bilateral primary osteoarthritis of knee: Secondary | ICD-10-CM | POA: Diagnosis not present

## 2020-01-07 DIAGNOSIS — M170 Bilateral primary osteoarthritis of knee: Secondary | ICD-10-CM | POA: Diagnosis not present

## 2020-01-08 DIAGNOSIS — M170 Bilateral primary osteoarthritis of knee: Secondary | ICD-10-CM | POA: Diagnosis not present

## 2020-01-09 DIAGNOSIS — M170 Bilateral primary osteoarthritis of knee: Secondary | ICD-10-CM | POA: Diagnosis not present

## 2020-01-10 DIAGNOSIS — M170 Bilateral primary osteoarthritis of knee: Secondary | ICD-10-CM | POA: Diagnosis not present

## 2020-01-11 DIAGNOSIS — M170 Bilateral primary osteoarthritis of knee: Secondary | ICD-10-CM | POA: Diagnosis not present

## 2020-01-12 DIAGNOSIS — M170 Bilateral primary osteoarthritis of knee: Secondary | ICD-10-CM | POA: Diagnosis not present

## 2020-01-13 DIAGNOSIS — R05 Cough: Secondary | ICD-10-CM | POA: Diagnosis not present

## 2020-01-13 DIAGNOSIS — M170 Bilateral primary osteoarthritis of knee: Secondary | ICD-10-CM | POA: Diagnosis not present

## 2020-01-13 DIAGNOSIS — Z20822 Contact with and (suspected) exposure to covid-19: Secondary | ICD-10-CM | POA: Diagnosis not present

## 2020-01-13 DIAGNOSIS — J069 Acute upper respiratory infection, unspecified: Secondary | ICD-10-CM | POA: Diagnosis not present

## 2020-01-13 DIAGNOSIS — M1712 Unilateral primary osteoarthritis, left knee: Secondary | ICD-10-CM | POA: Diagnosis not present

## 2020-01-14 DIAGNOSIS — M170 Bilateral primary osteoarthritis of knee: Secondary | ICD-10-CM | POA: Diagnosis not present

## 2020-01-15 DIAGNOSIS — M170 Bilateral primary osteoarthritis of knee: Secondary | ICD-10-CM | POA: Diagnosis not present

## 2020-01-16 DIAGNOSIS — M170 Bilateral primary osteoarthritis of knee: Secondary | ICD-10-CM | POA: Diagnosis not present

## 2020-01-17 DIAGNOSIS — M170 Bilateral primary osteoarthritis of knee: Secondary | ICD-10-CM | POA: Diagnosis not present

## 2020-01-18 DIAGNOSIS — Z23 Encounter for immunization: Secondary | ICD-10-CM | POA: Diagnosis not present

## 2020-01-18 DIAGNOSIS — M170 Bilateral primary osteoarthritis of knee: Secondary | ICD-10-CM | POA: Diagnosis not present

## 2020-01-19 DIAGNOSIS — M170 Bilateral primary osteoarthritis of knee: Secondary | ICD-10-CM | POA: Diagnosis not present

## 2020-01-20 DIAGNOSIS — M170 Bilateral primary osteoarthritis of knee: Secondary | ICD-10-CM | POA: Diagnosis not present

## 2020-01-21 DIAGNOSIS — M170 Bilateral primary osteoarthritis of knee: Secondary | ICD-10-CM | POA: Diagnosis not present

## 2020-01-22 DIAGNOSIS — M170 Bilateral primary osteoarthritis of knee: Secondary | ICD-10-CM | POA: Diagnosis not present

## 2020-01-23 DIAGNOSIS — M170 Bilateral primary osteoarthritis of knee: Secondary | ICD-10-CM | POA: Diagnosis not present

## 2020-01-24 DIAGNOSIS — M170 Bilateral primary osteoarthritis of knee: Secondary | ICD-10-CM | POA: Diagnosis not present

## 2020-01-25 DIAGNOSIS — M170 Bilateral primary osteoarthritis of knee: Secondary | ICD-10-CM | POA: Diagnosis not present

## 2020-01-26 DIAGNOSIS — M170 Bilateral primary osteoarthritis of knee: Secondary | ICD-10-CM | POA: Diagnosis not present

## 2020-01-27 DIAGNOSIS — R1013 Epigastric pain: Secondary | ICD-10-CM | POA: Diagnosis not present

## 2020-01-27 DIAGNOSIS — F419 Anxiety disorder, unspecified: Secondary | ICD-10-CM | POA: Diagnosis not present

## 2020-01-27 DIAGNOSIS — M1712 Unilateral primary osteoarthritis, left knee: Secondary | ICD-10-CM | POA: Diagnosis not present

## 2020-01-27 DIAGNOSIS — I10 Essential (primary) hypertension: Secondary | ICD-10-CM | POA: Diagnosis not present

## 2020-01-27 DIAGNOSIS — Z8619 Personal history of other infectious and parasitic diseases: Secondary | ICD-10-CM | POA: Diagnosis not present

## 2020-01-27 DIAGNOSIS — M170 Bilateral primary osteoarthritis of knee: Secondary | ICD-10-CM | POA: Diagnosis not present

## 2020-01-28 DIAGNOSIS — M170 Bilateral primary osteoarthritis of knee: Secondary | ICD-10-CM | POA: Diagnosis not present

## 2020-01-29 DIAGNOSIS — M170 Bilateral primary osteoarthritis of knee: Secondary | ICD-10-CM | POA: Diagnosis not present

## 2020-01-30 DIAGNOSIS — M170 Bilateral primary osteoarthritis of knee: Secondary | ICD-10-CM | POA: Diagnosis not present

## 2020-01-31 DIAGNOSIS — M170 Bilateral primary osteoarthritis of knee: Secondary | ICD-10-CM | POA: Diagnosis not present

## 2020-02-01 DIAGNOSIS — M170 Bilateral primary osteoarthritis of knee: Secondary | ICD-10-CM | POA: Diagnosis not present

## 2020-02-02 DIAGNOSIS — M170 Bilateral primary osteoarthritis of knee: Secondary | ICD-10-CM | POA: Diagnosis not present

## 2020-02-03 DIAGNOSIS — M170 Bilateral primary osteoarthritis of knee: Secondary | ICD-10-CM | POA: Diagnosis not present

## 2020-02-04 DIAGNOSIS — M170 Bilateral primary osteoarthritis of knee: Secondary | ICD-10-CM | POA: Diagnosis not present

## 2020-02-05 DIAGNOSIS — M170 Bilateral primary osteoarthritis of knee: Secondary | ICD-10-CM | POA: Diagnosis not present

## 2020-02-06 DIAGNOSIS — M170 Bilateral primary osteoarthritis of knee: Secondary | ICD-10-CM | POA: Diagnosis not present

## 2020-02-07 DIAGNOSIS — M170 Bilateral primary osteoarthritis of knee: Secondary | ICD-10-CM | POA: Diagnosis not present

## 2020-02-08 DIAGNOSIS — M170 Bilateral primary osteoarthritis of knee: Secondary | ICD-10-CM | POA: Diagnosis not present

## 2020-02-09 DIAGNOSIS — M170 Bilateral primary osteoarthritis of knee: Secondary | ICD-10-CM | POA: Diagnosis not present

## 2020-02-10 DIAGNOSIS — M170 Bilateral primary osteoarthritis of knee: Secondary | ICD-10-CM | POA: Diagnosis not present

## 2020-02-11 DIAGNOSIS — M170 Bilateral primary osteoarthritis of knee: Secondary | ICD-10-CM | POA: Diagnosis not present

## 2020-02-12 DIAGNOSIS — M170 Bilateral primary osteoarthritis of knee: Secondary | ICD-10-CM | POA: Diagnosis not present

## 2020-02-13 DIAGNOSIS — M170 Bilateral primary osteoarthritis of knee: Secondary | ICD-10-CM | POA: Diagnosis not present

## 2020-02-14 DIAGNOSIS — M170 Bilateral primary osteoarthritis of knee: Secondary | ICD-10-CM | POA: Diagnosis not present

## 2020-02-15 DIAGNOSIS — M170 Bilateral primary osteoarthritis of knee: Secondary | ICD-10-CM | POA: Diagnosis not present

## 2020-02-16 DIAGNOSIS — M170 Bilateral primary osteoarthritis of knee: Secondary | ICD-10-CM | POA: Diagnosis not present

## 2020-02-17 DIAGNOSIS — M170 Bilateral primary osteoarthritis of knee: Secondary | ICD-10-CM | POA: Diagnosis not present

## 2020-02-19 DIAGNOSIS — M170 Bilateral primary osteoarthritis of knee: Secondary | ICD-10-CM | POA: Diagnosis not present

## 2020-02-20 DIAGNOSIS — M170 Bilateral primary osteoarthritis of knee: Secondary | ICD-10-CM | POA: Diagnosis not present

## 2020-02-21 DIAGNOSIS — M170 Bilateral primary osteoarthritis of knee: Secondary | ICD-10-CM | POA: Diagnosis not present

## 2020-02-22 DIAGNOSIS — M170 Bilateral primary osteoarthritis of knee: Secondary | ICD-10-CM | POA: Diagnosis not present

## 2020-02-23 DIAGNOSIS — M170 Bilateral primary osteoarthritis of knee: Secondary | ICD-10-CM | POA: Diagnosis not present

## 2020-02-24 DIAGNOSIS — M170 Bilateral primary osteoarthritis of knee: Secondary | ICD-10-CM | POA: Diagnosis not present

## 2020-02-25 DIAGNOSIS — M170 Bilateral primary osteoarthritis of knee: Secondary | ICD-10-CM | POA: Diagnosis not present

## 2020-02-26 DIAGNOSIS — M170 Bilateral primary osteoarthritis of knee: Secondary | ICD-10-CM | POA: Diagnosis not present

## 2020-02-27 DIAGNOSIS — R110 Nausea: Secondary | ICD-10-CM | POA: Diagnosis not present

## 2020-02-27 DIAGNOSIS — R200 Anesthesia of skin: Secondary | ICD-10-CM | POA: Diagnosis not present

## 2020-02-27 DIAGNOSIS — M5412 Radiculopathy, cervical region: Secondary | ICD-10-CM | POA: Diagnosis not present

## 2020-02-27 DIAGNOSIS — M170 Bilateral primary osteoarthritis of knee: Secondary | ICD-10-CM | POA: Diagnosis not present

## 2020-02-27 DIAGNOSIS — M542 Cervicalgia: Secondary | ICD-10-CM | POA: Diagnosis not present

## 2020-02-28 DIAGNOSIS — M170 Bilateral primary osteoarthritis of knee: Secondary | ICD-10-CM | POA: Diagnosis not present

## 2020-02-29 DIAGNOSIS — M170 Bilateral primary osteoarthritis of knee: Secondary | ICD-10-CM | POA: Diagnosis not present

## 2020-03-01 DIAGNOSIS — M170 Bilateral primary osteoarthritis of knee: Secondary | ICD-10-CM | POA: Diagnosis not present

## 2020-03-02 DIAGNOSIS — M170 Bilateral primary osteoarthritis of knee: Secondary | ICD-10-CM | POA: Diagnosis not present

## 2020-03-03 DIAGNOSIS — M170 Bilateral primary osteoarthritis of knee: Secondary | ICD-10-CM | POA: Diagnosis not present

## 2020-03-03 DIAGNOSIS — M5412 Radiculopathy, cervical region: Secondary | ICD-10-CM | POA: Diagnosis not present

## 2020-03-03 DIAGNOSIS — F331 Major depressive disorder, recurrent, moderate: Secondary | ICD-10-CM | POA: Diagnosis not present

## 2020-03-04 DIAGNOSIS — M170 Bilateral primary osteoarthritis of knee: Secondary | ICD-10-CM | POA: Diagnosis not present

## 2020-03-05 DIAGNOSIS — M170 Bilateral primary osteoarthritis of knee: Secondary | ICD-10-CM | POA: Diagnosis not present

## 2020-03-06 DIAGNOSIS — M170 Bilateral primary osteoarthritis of knee: Secondary | ICD-10-CM | POA: Diagnosis not present

## 2020-03-07 DIAGNOSIS — M170 Bilateral primary osteoarthritis of knee: Secondary | ICD-10-CM | POA: Diagnosis not present

## 2020-03-08 DIAGNOSIS — M170 Bilateral primary osteoarthritis of knee: Secondary | ICD-10-CM | POA: Diagnosis not present

## 2020-03-09 DIAGNOSIS — M170 Bilateral primary osteoarthritis of knee: Secondary | ICD-10-CM | POA: Diagnosis not present

## 2020-03-10 DIAGNOSIS — M170 Bilateral primary osteoarthritis of knee: Secondary | ICD-10-CM | POA: Diagnosis not present

## 2020-03-11 DIAGNOSIS — M170 Bilateral primary osteoarthritis of knee: Secondary | ICD-10-CM | POA: Diagnosis not present

## 2020-03-12 DIAGNOSIS — M170 Bilateral primary osteoarthritis of knee: Secondary | ICD-10-CM | POA: Diagnosis not present

## 2020-03-13 DIAGNOSIS — M170 Bilateral primary osteoarthritis of knee: Secondary | ICD-10-CM | POA: Diagnosis not present

## 2020-03-14 DIAGNOSIS — M170 Bilateral primary osteoarthritis of knee: Secondary | ICD-10-CM | POA: Diagnosis not present

## 2020-03-15 DIAGNOSIS — M170 Bilateral primary osteoarthritis of knee: Secondary | ICD-10-CM | POA: Diagnosis not present

## 2020-03-16 DIAGNOSIS — M170 Bilateral primary osteoarthritis of knee: Secondary | ICD-10-CM | POA: Diagnosis not present

## 2020-03-17 DIAGNOSIS — M170 Bilateral primary osteoarthritis of knee: Secondary | ICD-10-CM | POA: Diagnosis not present

## 2020-03-18 DIAGNOSIS — M170 Bilateral primary osteoarthritis of knee: Secondary | ICD-10-CM | POA: Diagnosis not present

## 2020-03-19 DIAGNOSIS — M170 Bilateral primary osteoarthritis of knee: Secondary | ICD-10-CM | POA: Diagnosis not present

## 2020-03-20 DIAGNOSIS — M170 Bilateral primary osteoarthritis of knee: Secondary | ICD-10-CM | POA: Diagnosis not present

## 2020-03-21 DIAGNOSIS — M170 Bilateral primary osteoarthritis of knee: Secondary | ICD-10-CM | POA: Diagnosis not present

## 2020-03-22 DIAGNOSIS — M170 Bilateral primary osteoarthritis of knee: Secondary | ICD-10-CM | POA: Diagnosis not present

## 2020-03-23 ENCOUNTER — Ambulatory Visit: Payer: Self-pay

## 2020-03-23 DIAGNOSIS — M170 Bilateral primary osteoarthritis of knee: Secondary | ICD-10-CM | POA: Diagnosis not present

## 2020-03-24 DIAGNOSIS — M170 Bilateral primary osteoarthritis of knee: Secondary | ICD-10-CM | POA: Diagnosis not present

## 2020-03-25 DIAGNOSIS — M170 Bilateral primary osteoarthritis of knee: Secondary | ICD-10-CM | POA: Diagnosis not present

## 2020-03-26 DIAGNOSIS — M170 Bilateral primary osteoarthritis of knee: Secondary | ICD-10-CM | POA: Diagnosis not present

## 2020-03-27 DIAGNOSIS — M170 Bilateral primary osteoarthritis of knee: Secondary | ICD-10-CM | POA: Diagnosis not present

## 2020-03-28 ENCOUNTER — Other Ambulatory Visit: Payer: Self-pay

## 2020-03-28 ENCOUNTER — Ambulatory Visit: Payer: Medicaid Other | Admitting: Physician Assistant

## 2020-03-28 DIAGNOSIS — A5901 Trichomonal vulvovaginitis: Secondary | ICD-10-CM

## 2020-03-28 DIAGNOSIS — Z113 Encounter for screening for infections with a predominantly sexual mode of transmission: Secondary | ICD-10-CM | POA: Diagnosis not present

## 2020-03-28 DIAGNOSIS — N760 Acute vaginitis: Secondary | ICD-10-CM

## 2020-03-28 DIAGNOSIS — B9689 Other specified bacterial agents as the cause of diseases classified elsewhere: Secondary | ICD-10-CM

## 2020-03-28 DIAGNOSIS — M170 Bilateral primary osteoarthritis of knee: Secondary | ICD-10-CM | POA: Diagnosis not present

## 2020-03-28 LAB — WET PREP FOR TRICH, YEAST, CLUE
Trichomonas Exam: POSITIVE — AB
Yeast Exam: NEGATIVE

## 2020-03-28 MED ORDER — METRONIDAZOLE 500 MG PO TABS: 500.0000 mg | ORAL_TABLET | Freq: Two times a day (BID) | ORAL | 0 refills | Status: DC

## 2020-03-28 NOTE — Progress Notes (Signed)
Wet mount reviewed with provider and pt treated for Trich and BV per Sadie Haber, PA verbal order with Metronidazole 500mg  (#14), 1 tab po BID x7 days. Counseled pt per provider orders and pt states understanding. Provider orders completed.

## 2020-03-29 ENCOUNTER — Encounter: Payer: Self-pay | Admitting: Physician Assistant

## 2020-03-29 DIAGNOSIS — M170 Bilateral primary osteoarthritis of knee: Secondary | ICD-10-CM | POA: Diagnosis not present

## 2020-03-29 NOTE — Progress Notes (Signed)
[facility] Department STI clinic/screening visit  Subjective:  Diane Castillo is a 49 y.o. female being seen today for an STI screening visit. The patient reports they do not have symptoms.  Patient reports that they do not desire a pregnancy in the next year.   They reported they are not interested in discussing contraception today.  Patient's last menstrual period was 11/23/2014.   Patient has the following medical conditions:   Patient Active Problem List   Diagnosis Date Noted  . Anxiety 01/27/2020  . BMI 40.0-44.9, adult (HCC) 12/22/2018  . Chronic hepatitis C without hepatic coma (HCC) 03/18/2017  . Primary localized osteoarthritis of right knee 10/30/2016  . Rupture of medial head of right gastrocnemius 11/15/2015  . Right leg pain 11/14/2015  . Gastrocnemius muscle strain 11/14/2015  . Hypokalemia 11/14/2015  . Metabolic acidosis 11/14/2015  . Hyperglycemia 11/14/2015  . Drug abuse (HCC) 11/14/2015  . Rhabdomyolysis 11/13/2015  . HTN (hypertension) 09/06/2014  . Asthma 04/24/2010  . Iron deficiency anemia 04/24/2010  . Personal history of venous thrombosis and embolism 04/24/2010  . Major depressive disorder, recurrent episode, moderate (HCC) 11/30/2009  . Human immunodeficiency virus (HIV) disease (HCC) 09/30/2000    Chief Complaint  Patient presents with  . SEXUALLY TRANSMITTED DISEASE    screening    HPI  Patient reports that she is not having symptoms but was told that she may have been exposed to Trich or Chlamydia.  States that she is seen regularly by her PCP for chronic conditions.  Patient reports that she has HIV and is undetectable and had a hysterectomy due to fibroids.   See flowsheet for further details and programmatic requirements.    The following portions of the patient's history were reviewed and updated as appropriate: allergies, current medications, past medical history, past social history, past surgical history and problem  list.  Objective:  There were no vitals filed for this visit.  Physical Exam Constitutional:      General: She is not in acute distress.    Appearance: Normal appearance.  HENT:     Head: Normocephalic and atraumatic.     Comments: No nits,lice, or hair loss. No cervical, supraclavicular or axillary adenopathy.    Mouth/Throat:     Mouth: Mucous membranes are moist.     Pharynx: Oropharynx is clear. No oropharyngeal exudate or posterior oropharyngeal erythema.  Eyes:     Conjunctiva/sclera: Conjunctivae normal.  Pulmonary:     Effort: Pulmonary effort is normal.  Abdominal:     Palpations: Abdomen is soft. There is no mass.     Tenderness: There is no abdominal tenderness. There is no guarding or rebound.  Genitourinary:    General: Normal vulva.     Rectum: Normal.     Comments: External genitalia/pubic area without nits, lice, edema, erythema, lesions and inguinal adenopathy. Vagina with normal mucosa and small amount of thin white discharge, pH=>4.5. Cervix without visible lesions. Uterus firm, mobile, nt, no masses, no CMT, no adnexal tenderness or fullness. Musculoskeletal:     Cervical back: Neck supple. No tenderness.  Skin:    General: Skin is warm and dry.     Findings: No bruising, erythema, lesion or rash.  Neurological:     Mental Status: She is alert and oriented to person, place, and time.  Psychiatric:        Mood and Affect: Mood normal.        Thought Content: Thought content normal.  Judgment: Judgment normal.      Assessment and Plan:  RENNEE Castillo is a 49 y.o. female presenting to the [facility] Department for STI screening  1. Screening for STD (sexually transmitted disease) Patient into clinic without symptoms. Rec condoms with all sex. Await test results.  Counseled that RN will call if needs to RTC for treatment once results are back. - WET PREP FOR TRICH, YEAST, CLUE - Chlamydia/Gonorrhea [facility] - Syphilis  Serology, [facility]  2. BV (bacterial vaginosis) See below. - metroNIDAZOLE (FLAGYL) 500 MG tablet; Take 1 tablet (500 mg total) by mouth 2 (two) times daily.  Dispense: 14 tablet; Refill: 0  3. Trichomonal vaginitis Will treat for Trich and BV with Metronidazole 500 mg #14 1 po BID for 7 days with food, no EtOH for 24 hr before and until 72 hr after completing medicine. No sex for 10 days and until after partner completes treatment. Enc to use OTC antifungal cream if has itching during or just after antibiotics. - metroNIDAZOLE (FLAGYL) 500 MG tablet; Take 1 tablet (500 mg total) by mouth 2 (two) times daily.  Dispense: 14 tablet; Refill: 0     Return if symptoms worsen or fail to improve, ~3 months for TOC.  No future appointments.  Matt Holmes, PA

## 2020-03-30 DIAGNOSIS — M170 Bilateral primary osteoarthritis of knee: Secondary | ICD-10-CM | POA: Diagnosis not present

## 2020-03-31 DIAGNOSIS — M170 Bilateral primary osteoarthritis of knee: Secondary | ICD-10-CM | POA: Diagnosis not present

## 2020-04-01 DIAGNOSIS — M170 Bilateral primary osteoarthritis of knee: Secondary | ICD-10-CM | POA: Diagnosis not present

## 2020-04-02 DIAGNOSIS — M170 Bilateral primary osteoarthritis of knee: Secondary | ICD-10-CM | POA: Diagnosis not present

## 2020-04-03 DIAGNOSIS — M170 Bilateral primary osteoarthritis of knee: Secondary | ICD-10-CM | POA: Diagnosis not present

## 2020-04-04 DIAGNOSIS — M170 Bilateral primary osteoarthritis of knee: Secondary | ICD-10-CM | POA: Diagnosis not present

## 2020-04-05 DIAGNOSIS — M170 Bilateral primary osteoarthritis of knee: Secondary | ICD-10-CM | POA: Diagnosis not present

## 2020-04-06 DIAGNOSIS — M170 Bilateral primary osteoarthritis of knee: Secondary | ICD-10-CM | POA: Diagnosis not present

## 2020-04-07 DIAGNOSIS — M170 Bilateral primary osteoarthritis of knee: Secondary | ICD-10-CM | POA: Diagnosis not present

## 2020-04-08 DIAGNOSIS — M170 Bilateral primary osteoarthritis of knee: Secondary | ICD-10-CM | POA: Diagnosis not present

## 2020-04-09 DIAGNOSIS — M170 Bilateral primary osteoarthritis of knee: Secondary | ICD-10-CM | POA: Diagnosis not present

## 2020-04-10 DIAGNOSIS — M170 Bilateral primary osteoarthritis of knee: Secondary | ICD-10-CM | POA: Diagnosis not present

## 2020-04-11 DIAGNOSIS — R69 Illness, unspecified: Secondary | ICD-10-CM | POA: Diagnosis not present

## 2020-04-12 DIAGNOSIS — R69 Illness, unspecified: Secondary | ICD-10-CM | POA: Diagnosis not present

## 2020-04-13 DIAGNOSIS — R69 Illness, unspecified: Secondary | ICD-10-CM | POA: Diagnosis not present

## 2020-04-14 DIAGNOSIS — R69 Illness, unspecified: Secondary | ICD-10-CM | POA: Diagnosis not present

## 2020-04-15 DIAGNOSIS — R69 Illness, unspecified: Secondary | ICD-10-CM | POA: Diagnosis not present

## 2020-04-16 DIAGNOSIS — R69 Illness, unspecified: Secondary | ICD-10-CM | POA: Diagnosis not present

## 2020-04-17 DIAGNOSIS — R69 Illness, unspecified: Secondary | ICD-10-CM | POA: Diagnosis not present

## 2020-04-18 DIAGNOSIS — R69 Illness, unspecified: Secondary | ICD-10-CM | POA: Diagnosis not present

## 2020-04-20 DIAGNOSIS — R69 Illness, unspecified: Secondary | ICD-10-CM | POA: Diagnosis not present

## 2020-04-21 DIAGNOSIS — R69 Illness, unspecified: Secondary | ICD-10-CM | POA: Diagnosis not present

## 2020-04-22 DIAGNOSIS — R69 Illness, unspecified: Secondary | ICD-10-CM | POA: Diagnosis not present

## 2020-04-23 DIAGNOSIS — R69 Illness, unspecified: Secondary | ICD-10-CM | POA: Diagnosis not present

## 2020-04-24 DIAGNOSIS — R69 Illness, unspecified: Secondary | ICD-10-CM | POA: Diagnosis not present

## 2020-04-25 DIAGNOSIS — R69 Illness, unspecified: Secondary | ICD-10-CM | POA: Diagnosis not present

## 2020-04-26 DIAGNOSIS — R69 Illness, unspecified: Secondary | ICD-10-CM | POA: Diagnosis not present

## 2020-04-27 ENCOUNTER — Ambulatory Visit: Payer: Medicaid Other

## 2020-04-27 DIAGNOSIS — R69 Illness, unspecified: Secondary | ICD-10-CM | POA: Diagnosis not present

## 2020-04-28 DIAGNOSIS — R69 Illness, unspecified: Secondary | ICD-10-CM | POA: Diagnosis not present

## 2020-04-29 DIAGNOSIS — R69 Illness, unspecified: Secondary | ICD-10-CM | POA: Diagnosis not present

## 2020-04-30 DIAGNOSIS — R69 Illness, unspecified: Secondary | ICD-10-CM | POA: Diagnosis not present

## 2020-05-01 DIAGNOSIS — R69 Illness, unspecified: Secondary | ICD-10-CM | POA: Diagnosis not present

## 2020-05-02 DIAGNOSIS — R69 Illness, unspecified: Secondary | ICD-10-CM | POA: Diagnosis not present

## 2020-05-03 DIAGNOSIS — R69 Illness, unspecified: Secondary | ICD-10-CM | POA: Diagnosis not present

## 2020-05-04 DIAGNOSIS — R69 Illness, unspecified: Secondary | ICD-10-CM | POA: Diagnosis not present

## 2020-05-05 DIAGNOSIS — R69 Illness, unspecified: Secondary | ICD-10-CM | POA: Diagnosis not present

## 2020-05-06 DIAGNOSIS — R69 Illness, unspecified: Secondary | ICD-10-CM | POA: Diagnosis not present

## 2020-05-07 DIAGNOSIS — R69 Illness, unspecified: Secondary | ICD-10-CM | POA: Diagnosis not present

## 2020-05-08 DIAGNOSIS — R69 Illness, unspecified: Secondary | ICD-10-CM | POA: Diagnosis not present

## 2020-05-09 DIAGNOSIS — R69 Illness, unspecified: Secondary | ICD-10-CM | POA: Diagnosis not present

## 2020-05-10 DIAGNOSIS — R69 Illness, unspecified: Secondary | ICD-10-CM | POA: Diagnosis not present

## 2020-05-11 DIAGNOSIS — R69 Illness, unspecified: Secondary | ICD-10-CM | POA: Diagnosis not present

## 2020-05-12 DIAGNOSIS — R69 Illness, unspecified: Secondary | ICD-10-CM | POA: Diagnosis not present

## 2020-05-13 DIAGNOSIS — R69 Illness, unspecified: Secondary | ICD-10-CM | POA: Diagnosis not present

## 2020-05-14 DIAGNOSIS — R69 Illness, unspecified: Secondary | ICD-10-CM | POA: Diagnosis not present

## 2020-05-15 DIAGNOSIS — R69 Illness, unspecified: Secondary | ICD-10-CM | POA: Diagnosis not present

## 2020-05-16 DIAGNOSIS — R69 Illness, unspecified: Secondary | ICD-10-CM | POA: Diagnosis not present

## 2020-05-17 DIAGNOSIS — R69 Illness, unspecified: Secondary | ICD-10-CM | POA: Diagnosis not present

## 2020-05-18 DIAGNOSIS — R69 Illness, unspecified: Secondary | ICD-10-CM | POA: Diagnosis not present

## 2020-05-19 DIAGNOSIS — R69 Illness, unspecified: Secondary | ICD-10-CM | POA: Diagnosis not present

## 2020-05-20 DIAGNOSIS — R69 Illness, unspecified: Secondary | ICD-10-CM | POA: Diagnosis not present

## 2020-05-21 DIAGNOSIS — R69 Illness, unspecified: Secondary | ICD-10-CM | POA: Diagnosis not present

## 2020-05-22 DIAGNOSIS — R69 Illness, unspecified: Secondary | ICD-10-CM | POA: Diagnosis not present

## 2020-05-24 DIAGNOSIS — R69 Illness, unspecified: Secondary | ICD-10-CM | POA: Diagnosis not present

## 2020-05-25 DIAGNOSIS — R69 Illness, unspecified: Secondary | ICD-10-CM | POA: Diagnosis not present

## 2020-05-26 DIAGNOSIS — R69 Illness, unspecified: Secondary | ICD-10-CM | POA: Diagnosis not present

## 2020-05-27 DIAGNOSIS — R69 Illness, unspecified: Secondary | ICD-10-CM | POA: Diagnosis not present

## 2020-05-28 DIAGNOSIS — R69 Illness, unspecified: Secondary | ICD-10-CM | POA: Diagnosis not present

## 2020-05-29 DIAGNOSIS — R69 Illness, unspecified: Secondary | ICD-10-CM | POA: Diagnosis not present

## 2020-05-30 DIAGNOSIS — R69 Illness, unspecified: Secondary | ICD-10-CM | POA: Diagnosis not present

## 2020-05-31 DIAGNOSIS — R69 Illness, unspecified: Secondary | ICD-10-CM | POA: Diagnosis not present

## 2020-06-01 DIAGNOSIS — R69 Illness, unspecified: Secondary | ICD-10-CM | POA: Diagnosis not present

## 2020-06-02 DIAGNOSIS — R69 Illness, unspecified: Secondary | ICD-10-CM | POA: Diagnosis not present

## 2020-06-03 DIAGNOSIS — R69 Illness, unspecified: Secondary | ICD-10-CM | POA: Diagnosis not present

## 2020-06-04 DIAGNOSIS — R69 Illness, unspecified: Secondary | ICD-10-CM | POA: Diagnosis not present

## 2020-06-05 DIAGNOSIS — R69 Illness, unspecified: Secondary | ICD-10-CM | POA: Diagnosis not present

## 2020-06-06 DIAGNOSIS — R69 Illness, unspecified: Secondary | ICD-10-CM | POA: Diagnosis not present

## 2020-06-08 DIAGNOSIS — R69 Illness, unspecified: Secondary | ICD-10-CM | POA: Diagnosis not present

## 2020-06-09 DIAGNOSIS — R69 Illness, unspecified: Secondary | ICD-10-CM | POA: Diagnosis not present

## 2020-06-10 DIAGNOSIS — R69 Illness, unspecified: Secondary | ICD-10-CM | POA: Diagnosis not present

## 2020-06-11 DIAGNOSIS — R69 Illness, unspecified: Secondary | ICD-10-CM | POA: Diagnosis not present

## 2020-06-13 DIAGNOSIS — R69 Illness, unspecified: Secondary | ICD-10-CM | POA: Diagnosis not present

## 2020-06-13 DIAGNOSIS — Z23 Encounter for immunization: Secondary | ICD-10-CM | POA: Diagnosis not present

## 2020-06-14 DIAGNOSIS — R69 Illness, unspecified: Secondary | ICD-10-CM | POA: Diagnosis not present

## 2020-06-15 DIAGNOSIS — R69 Illness, unspecified: Secondary | ICD-10-CM | POA: Diagnosis not present

## 2020-06-15 DIAGNOSIS — Z1231 Encounter for screening mammogram for malignant neoplasm of breast: Secondary | ICD-10-CM | POA: Diagnosis not present

## 2020-06-16 DIAGNOSIS — R69 Illness, unspecified: Secondary | ICD-10-CM | POA: Diagnosis not present

## 2020-06-17 DIAGNOSIS — R69 Illness, unspecified: Secondary | ICD-10-CM | POA: Diagnosis not present

## 2020-06-18 DIAGNOSIS — M170 Bilateral primary osteoarthritis of knee: Secondary | ICD-10-CM | POA: Diagnosis not present

## 2020-06-19 DIAGNOSIS — M170 Bilateral primary osteoarthritis of knee: Secondary | ICD-10-CM | POA: Diagnosis not present

## 2020-06-20 DIAGNOSIS — M170 Bilateral primary osteoarthritis of knee: Secondary | ICD-10-CM | POA: Diagnosis not present

## 2020-06-21 DIAGNOSIS — M170 Bilateral primary osteoarthritis of knee: Secondary | ICD-10-CM | POA: Diagnosis not present

## 2020-06-22 DIAGNOSIS — M170 Bilateral primary osteoarthritis of knee: Secondary | ICD-10-CM | POA: Diagnosis not present

## 2020-06-23 DIAGNOSIS — J0140 Acute pansinusitis, unspecified: Secondary | ICD-10-CM | POA: Diagnosis not present

## 2020-06-23 DIAGNOSIS — Z03818 Encounter for observation for suspected exposure to other biological agents ruled out: Secondary | ICD-10-CM | POA: Diagnosis not present

## 2020-06-23 DIAGNOSIS — U071 COVID-19: Secondary | ICD-10-CM | POA: Diagnosis not present

## 2020-06-24 DIAGNOSIS — M170 Bilateral primary osteoarthritis of knee: Secondary | ICD-10-CM | POA: Diagnosis not present

## 2020-06-25 DIAGNOSIS — M170 Bilateral primary osteoarthritis of knee: Secondary | ICD-10-CM | POA: Diagnosis not present

## 2020-06-26 DIAGNOSIS — M170 Bilateral primary osteoarthritis of knee: Secondary | ICD-10-CM | POA: Diagnosis not present

## 2020-06-27 DIAGNOSIS — M170 Bilateral primary osteoarthritis of knee: Secondary | ICD-10-CM | POA: Diagnosis not present

## 2020-06-28 DIAGNOSIS — M170 Bilateral primary osteoarthritis of knee: Secondary | ICD-10-CM | POA: Diagnosis not present

## 2020-06-29 DIAGNOSIS — M170 Bilateral primary osteoarthritis of knee: Secondary | ICD-10-CM | POA: Diagnosis not present

## 2020-06-30 ENCOUNTER — Ambulatory Visit: Payer: Medicaid Other | Admitting: Physician Assistant

## 2020-06-30 ENCOUNTER — Other Ambulatory Visit: Payer: Self-pay | Admitting: Physician Assistant

## 2020-06-30 ENCOUNTER — Encounter: Payer: Self-pay | Admitting: Physician Assistant

## 2020-06-30 ENCOUNTER — Other Ambulatory Visit: Payer: Self-pay

## 2020-06-30 DIAGNOSIS — Z113 Encounter for screening for infections with a predominantly sexual mode of transmission: Secondary | ICD-10-CM | POA: Diagnosis not present

## 2020-06-30 DIAGNOSIS — M170 Bilateral primary osteoarthritis of knee: Secondary | ICD-10-CM | POA: Diagnosis not present

## 2020-06-30 LAB — WET PREP FOR TRICH, YEAST, CLUE
Trichomonas Exam: NEGATIVE
Yeast Exam: NEGATIVE

## 2020-06-30 NOTE — Progress Notes (Signed)
[facility] Department STI clinic/screening visit  Subjective:  Diane Castillo is a 50 y.o. female being seen today for an STI screening visit. The patient reports they do not have symptoms.  Patient reports that they do not desire a pregnancy in the next year.   They reported they are not interested in discussing contraception today.  Patient's last menstrual period was 11/23/2014.   Patient has the following medical conditions:   Patient Active Problem List   Diagnosis Date Noted  . Anxiety 01/27/2020  . BMI 40.0-44.9, adult (HCC) 12/22/2018  . Chronic hepatitis C without hepatic coma (HCC) 03/18/2017  . Primary localized osteoarthritis of right knee 10/30/2016  . Rupture of medial head of right gastrocnemius 11/15/2015  . Right leg pain 11/14/2015  . Gastrocnemius muscle strain 11/14/2015  . Hypokalemia 11/14/2015  . Metabolic acidosis 11/14/2015  . Hyperglycemia 11/14/2015  . Drug abuse (HCC) 11/14/2015  . Rhabdomyolysis 11/13/2015  . HTN (hypertension) 09/06/2014  . Asthma 04/24/2010  . Iron deficiency anemia 04/24/2010  . Personal history of venous thrombosis and embolism 04/24/2010  . Major depressive disorder, recurrent episode, moderate (HCC) 11/30/2009  . Human immunodeficiency virus (HIV) disease (HCC) 09/30/2000    Chief Complaint  Patient presents with  . SEXUALLY TRANSMITTED DISEASE    screening    HPI  Patient reports that she is not having any symptoms but would like a screening today and particularly wants to make sure that Diane Castillo is gone.  States that she is followed regularly for her chronic conditions and had a hysterectomy in 2016 due to fibroids.  Last pap was prior to hysterectomy and     See flowsheet for further details and programmatic requirements.    The following portions of the patient's history were reviewed and updated as appropriate: allergies, current medications, past medical history, past social history, past surgical  history and problem list.  Objective:  There were no vitals filed for this visit.  Physical Exam Constitutional:      General: She is not in acute distress.    Appearance: Normal appearance.  HENT:     Head: Normocephalic and atraumatic.     Mouth/Throat:     Mouth: Mucous membranes are moist.     Pharynx: Oropharynx is clear. No oropharyngeal exudate or posterior oropharyngeal erythema.  Eyes:     Conjunctiva/sclera: Conjunctivae normal.  Pulmonary:     Effort: Pulmonary effort is normal.  Musculoskeletal:     Cervical back: Neck supple. No tenderness.  Skin:    General: Skin is warm and dry.     Findings: No bruising, erythema, lesion or rash.  Neurological:     Mental Status: She is alert and oriented to person, place, and time.  Psychiatric:        Mood and Affect: Mood normal.        Behavior: Behavior normal.        Thought Content: Thought content normal.        Judgment: Judgment normal.      Assessment and Plan:  Diane Castillo is a 50 y.o. female presenting to the [facility] Department for STI screening  1. Screening for STD (sexually transmitted disease) Patient into clinic without symptoms. Patient desires to self-collect vaginal samples for testing.  Reviewed with patient how to collect samples for accurate results. Patient requests copy of wet mount results and signed ROI for same. Reviewed with patient wet mount results and that no treatment is indicated today. Rec  condoms with all sex. Await test results.  Counseled that RN will call if needs to RTC for treatment once results are back. - WET PREP FOR TRICH, YEAST, CLUE - Chlamydia/Gonorrhea [facility] - Syphilis Serology, [facility]     No follow-ups on file.  No future appointments.  Matt Holmes, PA

## 2020-07-03 DIAGNOSIS — M170 Bilateral primary osteoarthritis of knee: Secondary | ICD-10-CM | POA: Diagnosis not present

## 2020-07-04 DIAGNOSIS — M170 Bilateral primary osteoarthritis of knee: Secondary | ICD-10-CM | POA: Diagnosis not present

## 2020-07-05 DIAGNOSIS — M170 Bilateral primary osteoarthritis of knee: Secondary | ICD-10-CM | POA: Diagnosis not present

## 2020-07-06 DIAGNOSIS — M170 Bilateral primary osteoarthritis of knee: Secondary | ICD-10-CM | POA: Diagnosis not present

## 2020-07-07 DIAGNOSIS — M170 Bilateral primary osteoarthritis of knee: Secondary | ICD-10-CM | POA: Diagnosis not present

## 2020-07-08 DIAGNOSIS — M170 Bilateral primary osteoarthritis of knee: Secondary | ICD-10-CM | POA: Diagnosis not present

## 2020-07-09 DIAGNOSIS — M170 Bilateral primary osteoarthritis of knee: Secondary | ICD-10-CM | POA: Diagnosis not present

## 2020-07-10 DIAGNOSIS — M170 Bilateral primary osteoarthritis of knee: Secondary | ICD-10-CM | POA: Diagnosis not present

## 2020-07-12 DIAGNOSIS — M170 Bilateral primary osteoarthritis of knee: Secondary | ICD-10-CM | POA: Diagnosis not present

## 2020-07-13 DIAGNOSIS — M170 Bilateral primary osteoarthritis of knee: Secondary | ICD-10-CM | POA: Diagnosis not present

## 2020-07-14 DIAGNOSIS — M170 Bilateral primary osteoarthritis of knee: Secondary | ICD-10-CM | POA: Diagnosis not present

## 2020-07-15 DIAGNOSIS — M170 Bilateral primary osteoarthritis of knee: Secondary | ICD-10-CM | POA: Diagnosis not present

## 2020-07-16 DIAGNOSIS — M170 Bilateral primary osteoarthritis of knee: Secondary | ICD-10-CM | POA: Diagnosis not present

## 2020-07-17 DIAGNOSIS — M170 Bilateral primary osteoarthritis of knee: Secondary | ICD-10-CM | POA: Diagnosis not present

## 2020-07-18 DIAGNOSIS — M170 Bilateral primary osteoarthritis of knee: Secondary | ICD-10-CM | POA: Diagnosis not present

## 2020-07-19 DIAGNOSIS — M170 Bilateral primary osteoarthritis of knee: Secondary | ICD-10-CM | POA: Diagnosis not present

## 2020-07-20 DIAGNOSIS — M170 Bilateral primary osteoarthritis of knee: Secondary | ICD-10-CM | POA: Diagnosis not present

## 2020-07-21 DIAGNOSIS — M170 Bilateral primary osteoarthritis of knee: Secondary | ICD-10-CM | POA: Diagnosis not present

## 2020-07-22 DIAGNOSIS — M170 Bilateral primary osteoarthritis of knee: Secondary | ICD-10-CM | POA: Diagnosis not present

## 2020-07-23 DIAGNOSIS — M170 Bilateral primary osteoarthritis of knee: Secondary | ICD-10-CM | POA: Diagnosis not present

## 2020-07-24 DIAGNOSIS — M170 Bilateral primary osteoarthritis of knee: Secondary | ICD-10-CM | POA: Diagnosis not present

## 2020-07-25 DIAGNOSIS — M25561 Pain in right knee: Secondary | ICD-10-CM | POA: Diagnosis not present

## 2020-07-25 DIAGNOSIS — G8929 Other chronic pain: Secondary | ICD-10-CM | POA: Diagnosis not present

## 2020-07-25 DIAGNOSIS — M170 Bilateral primary osteoarthritis of knee: Secondary | ICD-10-CM | POA: Diagnosis not present

## 2020-07-25 DIAGNOSIS — M79672 Pain in left foot: Secondary | ICD-10-CM | POA: Diagnosis not present

## 2020-07-26 DIAGNOSIS — M170 Bilateral primary osteoarthritis of knee: Secondary | ICD-10-CM | POA: Diagnosis not present

## 2020-07-27 DIAGNOSIS — M170 Bilateral primary osteoarthritis of knee: Secondary | ICD-10-CM | POA: Diagnosis not present

## 2020-07-28 DIAGNOSIS — M170 Bilateral primary osteoarthritis of knee: Secondary | ICD-10-CM | POA: Diagnosis not present

## 2020-07-29 DIAGNOSIS — M170 Bilateral primary osteoarthritis of knee: Secondary | ICD-10-CM | POA: Diagnosis not present

## 2020-07-30 DIAGNOSIS — M170 Bilateral primary osteoarthritis of knee: Secondary | ICD-10-CM | POA: Diagnosis not present

## 2020-07-31 DIAGNOSIS — M170 Bilateral primary osteoarthritis of knee: Secondary | ICD-10-CM | POA: Diagnosis not present

## 2020-08-01 DIAGNOSIS — M67472 Ganglion, left ankle and foot: Secondary | ICD-10-CM | POA: Diagnosis not present

## 2020-08-01 DIAGNOSIS — M170 Bilateral primary osteoarthritis of knee: Secondary | ICD-10-CM | POA: Diagnosis not present

## 2020-08-01 DIAGNOSIS — M79672 Pain in left foot: Secondary | ICD-10-CM | POA: Diagnosis not present

## 2020-08-03 DIAGNOSIS — M67479 Ganglion, unspecified ankle and foot: Secondary | ICD-10-CM | POA: Diagnosis not present

## 2020-08-09 DIAGNOSIS — M170 Bilateral primary osteoarthritis of knee: Secondary | ICD-10-CM | POA: Diagnosis not present

## 2020-08-10 DIAGNOSIS — M170 Bilateral primary osteoarthritis of knee: Secondary | ICD-10-CM | POA: Diagnosis not present

## 2020-08-11 DIAGNOSIS — M67472 Ganglion, left ankle and foot: Secondary | ICD-10-CM | POA: Diagnosis not present

## 2020-08-11 DIAGNOSIS — M170 Bilateral primary osteoarthritis of knee: Secondary | ICD-10-CM | POA: Diagnosis not present

## 2020-08-12 DIAGNOSIS — M170 Bilateral primary osteoarthritis of knee: Secondary | ICD-10-CM | POA: Diagnosis not present

## 2020-08-13 DIAGNOSIS — M170 Bilateral primary osteoarthritis of knee: Secondary | ICD-10-CM | POA: Diagnosis not present

## 2020-08-14 DIAGNOSIS — M170 Bilateral primary osteoarthritis of knee: Secondary | ICD-10-CM | POA: Diagnosis not present

## 2020-08-15 DIAGNOSIS — M170 Bilateral primary osteoarthritis of knee: Secondary | ICD-10-CM | POA: Diagnosis not present

## 2020-08-16 DIAGNOSIS — M170 Bilateral primary osteoarthritis of knee: Secondary | ICD-10-CM | POA: Diagnosis not present

## 2020-08-17 DIAGNOSIS — M170 Bilateral primary osteoarthritis of knee: Secondary | ICD-10-CM | POA: Diagnosis not present

## 2020-08-18 DIAGNOSIS — M170 Bilateral primary osteoarthritis of knee: Secondary | ICD-10-CM | POA: Diagnosis not present

## 2020-08-19 DIAGNOSIS — M170 Bilateral primary osteoarthritis of knee: Secondary | ICD-10-CM | POA: Diagnosis not present

## 2020-08-20 DIAGNOSIS — M170 Bilateral primary osteoarthritis of knee: Secondary | ICD-10-CM | POA: Diagnosis not present

## 2020-08-21 DIAGNOSIS — M170 Bilateral primary osteoarthritis of knee: Secondary | ICD-10-CM | POA: Diagnosis not present

## 2020-08-22 DIAGNOSIS — M170 Bilateral primary osteoarthritis of knee: Secondary | ICD-10-CM | POA: Diagnosis not present

## 2020-08-23 DIAGNOSIS — M170 Bilateral primary osteoarthritis of knee: Secondary | ICD-10-CM | POA: Diagnosis not present

## 2020-08-24 DIAGNOSIS — M170 Bilateral primary osteoarthritis of knee: Secondary | ICD-10-CM | POA: Diagnosis not present

## 2020-08-25 DIAGNOSIS — M170 Bilateral primary osteoarthritis of knee: Secondary | ICD-10-CM | POA: Diagnosis not present

## 2020-08-26 DIAGNOSIS — M170 Bilateral primary osteoarthritis of knee: Secondary | ICD-10-CM | POA: Diagnosis not present

## 2020-08-27 DIAGNOSIS — M170 Bilateral primary osteoarthritis of knee: Secondary | ICD-10-CM | POA: Diagnosis not present

## 2020-08-28 DIAGNOSIS — M170 Bilateral primary osteoarthritis of knee: Secondary | ICD-10-CM | POA: Diagnosis not present

## 2020-08-29 DIAGNOSIS — M170 Bilateral primary osteoarthritis of knee: Secondary | ICD-10-CM | POA: Diagnosis not present

## 2020-08-30 DIAGNOSIS — M170 Bilateral primary osteoarthritis of knee: Secondary | ICD-10-CM | POA: Diagnosis not present

## 2020-08-31 DIAGNOSIS — M170 Bilateral primary osteoarthritis of knee: Secondary | ICD-10-CM | POA: Diagnosis not present

## 2020-09-01 DIAGNOSIS — M170 Bilateral primary osteoarthritis of knee: Secondary | ICD-10-CM | POA: Diagnosis not present

## 2020-09-02 DIAGNOSIS — M170 Bilateral primary osteoarthritis of knee: Secondary | ICD-10-CM | POA: Diagnosis not present

## 2020-09-03 DIAGNOSIS — M170 Bilateral primary osteoarthritis of knee: Secondary | ICD-10-CM | POA: Diagnosis not present

## 2020-09-04 DIAGNOSIS — M170 Bilateral primary osteoarthritis of knee: Secondary | ICD-10-CM | POA: Diagnosis not present

## 2020-09-05 DIAGNOSIS — M170 Bilateral primary osteoarthritis of knee: Secondary | ICD-10-CM | POA: Diagnosis not present

## 2020-09-06 DIAGNOSIS — Z20822 Contact with and (suspected) exposure to covid-19: Secondary | ICD-10-CM | POA: Diagnosis not present

## 2020-09-06 DIAGNOSIS — R059 Cough, unspecified: Secondary | ICD-10-CM | POA: Diagnosis not present

## 2020-09-06 DIAGNOSIS — M170 Bilateral primary osteoarthritis of knee: Secondary | ICD-10-CM | POA: Diagnosis not present

## 2020-09-06 DIAGNOSIS — J019 Acute sinusitis, unspecified: Secondary | ICD-10-CM | POA: Diagnosis not present

## 2020-09-06 DIAGNOSIS — J029 Acute pharyngitis, unspecified: Secondary | ICD-10-CM | POA: Diagnosis not present

## 2020-09-06 DIAGNOSIS — R0602 Shortness of breath: Secondary | ICD-10-CM | POA: Diagnosis not present

## 2020-09-07 DIAGNOSIS — M170 Bilateral primary osteoarthritis of knee: Secondary | ICD-10-CM | POA: Diagnosis not present

## 2020-09-08 DIAGNOSIS — M170 Bilateral primary osteoarthritis of knee: Secondary | ICD-10-CM | POA: Diagnosis not present

## 2020-09-09 DIAGNOSIS — M170 Bilateral primary osteoarthritis of knee: Secondary | ICD-10-CM | POA: Diagnosis not present

## 2020-09-10 DIAGNOSIS — M170 Bilateral primary osteoarthritis of knee: Secondary | ICD-10-CM | POA: Diagnosis not present

## 2020-09-11 DIAGNOSIS — M170 Bilateral primary osteoarthritis of knee: Secondary | ICD-10-CM | POA: Diagnosis not present

## 2020-09-12 DIAGNOSIS — M170 Bilateral primary osteoarthritis of knee: Secondary | ICD-10-CM | POA: Diagnosis not present

## 2020-09-13 DIAGNOSIS — M170 Bilateral primary osteoarthritis of knee: Secondary | ICD-10-CM | POA: Diagnosis not present

## 2020-09-14 DIAGNOSIS — M170 Bilateral primary osteoarthritis of knee: Secondary | ICD-10-CM | POA: Diagnosis not present

## 2020-09-15 DIAGNOSIS — M170 Bilateral primary osteoarthritis of knee: Secondary | ICD-10-CM | POA: Diagnosis not present

## 2020-09-16 DIAGNOSIS — M170 Bilateral primary osteoarthritis of knee: Secondary | ICD-10-CM | POA: Diagnosis not present

## 2020-09-17 DIAGNOSIS — M170 Bilateral primary osteoarthritis of knee: Secondary | ICD-10-CM | POA: Diagnosis not present

## 2020-09-18 DIAGNOSIS — M170 Bilateral primary osteoarthritis of knee: Secondary | ICD-10-CM | POA: Diagnosis not present

## 2020-09-19 DIAGNOSIS — M170 Bilateral primary osteoarthritis of knee: Secondary | ICD-10-CM | POA: Diagnosis not present

## 2020-09-20 DIAGNOSIS — M170 Bilateral primary osteoarthritis of knee: Secondary | ICD-10-CM | POA: Diagnosis not present

## 2020-09-21 DIAGNOSIS — M170 Bilateral primary osteoarthritis of knee: Secondary | ICD-10-CM | POA: Diagnosis not present

## 2020-09-22 DIAGNOSIS — M170 Bilateral primary osteoarthritis of knee: Secondary | ICD-10-CM | POA: Diagnosis not present

## 2020-09-23 DIAGNOSIS — M170 Bilateral primary osteoarthritis of knee: Secondary | ICD-10-CM | POA: Diagnosis not present

## 2020-09-24 DIAGNOSIS — M170 Bilateral primary osteoarthritis of knee: Secondary | ICD-10-CM | POA: Diagnosis not present

## 2020-09-25 DIAGNOSIS — M170 Bilateral primary osteoarthritis of knee: Secondary | ICD-10-CM | POA: Diagnosis not present

## 2020-09-26 DIAGNOSIS — M170 Bilateral primary osteoarthritis of knee: Secondary | ICD-10-CM | POA: Diagnosis not present

## 2020-09-27 DIAGNOSIS — M170 Bilateral primary osteoarthritis of knee: Secondary | ICD-10-CM | POA: Diagnosis not present

## 2020-09-28 DIAGNOSIS — M170 Bilateral primary osteoarthritis of knee: Secondary | ICD-10-CM | POA: Diagnosis not present

## 2020-09-29 DIAGNOSIS — M170 Bilateral primary osteoarthritis of knee: Secondary | ICD-10-CM | POA: Diagnosis not present

## 2020-09-30 DIAGNOSIS — M170 Bilateral primary osteoarthritis of knee: Secondary | ICD-10-CM | POA: Diagnosis not present

## 2020-10-01 DIAGNOSIS — M170 Bilateral primary osteoarthritis of knee: Secondary | ICD-10-CM | POA: Diagnosis not present

## 2020-10-02 DIAGNOSIS — M170 Bilateral primary osteoarthritis of knee: Secondary | ICD-10-CM | POA: Diagnosis not present

## 2020-10-03 DIAGNOSIS — M170 Bilateral primary osteoarthritis of knee: Secondary | ICD-10-CM | POA: Diagnosis not present

## 2020-10-04 DIAGNOSIS — M170 Bilateral primary osteoarthritis of knee: Secondary | ICD-10-CM | POA: Diagnosis not present

## 2020-10-05 DIAGNOSIS — M170 Bilateral primary osteoarthritis of knee: Secondary | ICD-10-CM | POA: Diagnosis not present

## 2020-10-06 DIAGNOSIS — M170 Bilateral primary osteoarthritis of knee: Secondary | ICD-10-CM | POA: Diagnosis not present

## 2020-10-07 DIAGNOSIS — M170 Bilateral primary osteoarthritis of knee: Secondary | ICD-10-CM | POA: Diagnosis not present

## 2020-10-07 DIAGNOSIS — J329 Chronic sinusitis, unspecified: Secondary | ICD-10-CM | POA: Diagnosis not present

## 2020-10-08 DIAGNOSIS — M170 Bilateral primary osteoarthritis of knee: Secondary | ICD-10-CM | POA: Diagnosis not present

## 2020-10-09 DIAGNOSIS — M170 Bilateral primary osteoarthritis of knee: Secondary | ICD-10-CM | POA: Diagnosis not present

## 2020-10-11 DIAGNOSIS — M170 Bilateral primary osteoarthritis of knee: Secondary | ICD-10-CM | POA: Diagnosis not present

## 2020-10-12 DIAGNOSIS — M170 Bilateral primary osteoarthritis of knee: Secondary | ICD-10-CM | POA: Diagnosis not present

## 2020-10-13 DIAGNOSIS — M170 Bilateral primary osteoarthritis of knee: Secondary | ICD-10-CM | POA: Diagnosis not present

## 2020-10-14 DIAGNOSIS — M170 Bilateral primary osteoarthritis of knee: Secondary | ICD-10-CM | POA: Diagnosis not present

## 2020-10-15 DIAGNOSIS — M170 Bilateral primary osteoarthritis of knee: Secondary | ICD-10-CM | POA: Diagnosis not present

## 2020-10-16 DIAGNOSIS — M170 Bilateral primary osteoarthritis of knee: Secondary | ICD-10-CM | POA: Diagnosis not present

## 2020-10-17 DIAGNOSIS — Z23 Encounter for immunization: Secondary | ICD-10-CM | POA: Diagnosis not present

## 2020-10-17 DIAGNOSIS — J329 Chronic sinusitis, unspecified: Secondary | ICD-10-CM | POA: Diagnosis not present

## 2020-10-17 DIAGNOSIS — B20 Human immunodeficiency virus [HIV] disease: Secondary | ICD-10-CM | POA: Diagnosis not present

## 2020-10-17 DIAGNOSIS — M25562 Pain in left knee: Secondary | ICD-10-CM | POA: Diagnosis not present

## 2020-10-17 DIAGNOSIS — M170 Bilateral primary osteoarthritis of knee: Secondary | ICD-10-CM | POA: Diagnosis not present

## 2020-10-17 DIAGNOSIS — M79605 Pain in left leg: Secondary | ICD-10-CM | POA: Diagnosis not present

## 2020-10-18 DIAGNOSIS — M170 Bilateral primary osteoarthritis of knee: Secondary | ICD-10-CM | POA: Diagnosis not present

## 2020-10-19 DIAGNOSIS — M170 Bilateral primary osteoarthritis of knee: Secondary | ICD-10-CM | POA: Diagnosis not present

## 2020-10-20 DIAGNOSIS — M170 Bilateral primary osteoarthritis of knee: Secondary | ICD-10-CM | POA: Diagnosis not present

## 2020-10-21 DIAGNOSIS — M170 Bilateral primary osteoarthritis of knee: Secondary | ICD-10-CM | POA: Diagnosis not present

## 2020-10-22 DIAGNOSIS — M170 Bilateral primary osteoarthritis of knee: Secondary | ICD-10-CM | POA: Diagnosis not present

## 2020-10-23 DIAGNOSIS — H53149 Visual discomfort, unspecified: Secondary | ICD-10-CM | POA: Diagnosis not present

## 2020-10-23 DIAGNOSIS — M170 Bilateral primary osteoarthritis of knee: Secondary | ICD-10-CM | POA: Diagnosis not present

## 2020-10-23 DIAGNOSIS — H5711 Ocular pain, right eye: Secondary | ICD-10-CM | POA: Diagnosis not present

## 2020-10-24 DIAGNOSIS — H5710 Ocular pain, unspecified eye: Secondary | ICD-10-CM | POA: Diagnosis not present

## 2020-10-24 DIAGNOSIS — M170 Bilateral primary osteoarthritis of knee: Secondary | ICD-10-CM | POA: Diagnosis not present

## 2020-10-24 DIAGNOSIS — H18891 Other specified disorders of cornea, right eye: Secondary | ICD-10-CM | POA: Diagnosis not present

## 2020-10-24 DIAGNOSIS — H04129 Dry eye syndrome of unspecified lacrimal gland: Secondary | ICD-10-CM | POA: Diagnosis not present

## 2020-10-24 DIAGNOSIS — B20 Human immunodeficiency virus [HIV] disease: Secondary | ICD-10-CM | POA: Diagnosis not present

## 2020-10-25 DIAGNOSIS — M170 Bilateral primary osteoarthritis of knee: Secondary | ICD-10-CM | POA: Diagnosis not present

## 2020-10-26 DIAGNOSIS — M170 Bilateral primary osteoarthritis of knee: Secondary | ICD-10-CM | POA: Diagnosis not present

## 2020-10-27 DIAGNOSIS — M170 Bilateral primary osteoarthritis of knee: Secondary | ICD-10-CM | POA: Diagnosis not present

## 2020-10-28 DIAGNOSIS — M170 Bilateral primary osteoarthritis of knee: Secondary | ICD-10-CM | POA: Diagnosis not present

## 2020-10-29 DIAGNOSIS — M170 Bilateral primary osteoarthritis of knee: Secondary | ICD-10-CM | POA: Diagnosis not present

## 2020-10-31 DIAGNOSIS — M170 Bilateral primary osteoarthritis of knee: Secondary | ICD-10-CM | POA: Diagnosis not present

## 2020-11-01 DIAGNOSIS — M170 Bilateral primary osteoarthritis of knee: Secondary | ICD-10-CM | POA: Diagnosis not present

## 2020-11-02 DIAGNOSIS — M170 Bilateral primary osteoarthritis of knee: Secondary | ICD-10-CM | POA: Diagnosis not present

## 2020-11-03 DIAGNOSIS — M170 Bilateral primary osteoarthritis of knee: Secondary | ICD-10-CM | POA: Diagnosis not present

## 2020-11-04 DIAGNOSIS — M170 Bilateral primary osteoarthritis of knee: Secondary | ICD-10-CM | POA: Diagnosis not present

## 2020-11-05 DIAGNOSIS — M170 Bilateral primary osteoarthritis of knee: Secondary | ICD-10-CM | POA: Diagnosis not present

## 2020-11-06 DIAGNOSIS — M170 Bilateral primary osteoarthritis of knee: Secondary | ICD-10-CM | POA: Diagnosis not present

## 2020-11-07 DIAGNOSIS — M170 Bilateral primary osteoarthritis of knee: Secondary | ICD-10-CM | POA: Diagnosis not present

## 2020-11-08 DIAGNOSIS — M170 Bilateral primary osteoarthritis of knee: Secondary | ICD-10-CM | POA: Diagnosis not present

## 2020-11-10 DIAGNOSIS — M170 Bilateral primary osteoarthritis of knee: Secondary | ICD-10-CM | POA: Diagnosis not present

## 2020-11-11 DIAGNOSIS — M170 Bilateral primary osteoarthritis of knee: Secondary | ICD-10-CM | POA: Diagnosis not present

## 2020-11-12 DIAGNOSIS — M170 Bilateral primary osteoarthritis of knee: Secondary | ICD-10-CM | POA: Diagnosis not present

## 2020-11-13 DIAGNOSIS — M170 Bilateral primary osteoarthritis of knee: Secondary | ICD-10-CM | POA: Diagnosis not present

## 2020-11-14 DIAGNOSIS — M170 Bilateral primary osteoarthritis of knee: Secondary | ICD-10-CM | POA: Diagnosis not present

## 2020-11-15 DIAGNOSIS — M170 Bilateral primary osteoarthritis of knee: Secondary | ICD-10-CM | POA: Diagnosis not present

## 2020-11-16 DIAGNOSIS — M170 Bilateral primary osteoarthritis of knee: Secondary | ICD-10-CM | POA: Diagnosis not present

## 2020-11-17 DIAGNOSIS — M170 Bilateral primary osteoarthritis of knee: Secondary | ICD-10-CM | POA: Diagnosis not present

## 2020-11-18 DIAGNOSIS — M170 Bilateral primary osteoarthritis of knee: Secondary | ICD-10-CM | POA: Diagnosis not present

## 2020-11-19 DIAGNOSIS — M170 Bilateral primary osteoarthritis of knee: Secondary | ICD-10-CM | POA: Diagnosis not present

## 2020-11-20 DIAGNOSIS — M170 Bilateral primary osteoarthritis of knee: Secondary | ICD-10-CM | POA: Diagnosis not present

## 2020-11-21 DIAGNOSIS — M170 Bilateral primary osteoarthritis of knee: Secondary | ICD-10-CM | POA: Diagnosis not present

## 2020-11-22 ENCOUNTER — Ambulatory Visit (LOCAL_COMMUNITY_HEALTH_CENTER): Payer: Medicaid Other

## 2020-11-22 ENCOUNTER — Ambulatory Visit: Payer: Medicaid Other | Admitting: Advanced Practice Midwife

## 2020-11-22 ENCOUNTER — Other Ambulatory Visit: Payer: Self-pay

## 2020-11-22 DIAGNOSIS — Z113 Encounter for screening for infections with a predominantly sexual mode of transmission: Secondary | ICD-10-CM

## 2020-11-22 DIAGNOSIS — M170 Bilateral primary osteoarthritis of knee: Secondary | ICD-10-CM | POA: Diagnosis not present

## 2020-11-22 DIAGNOSIS — Z111 Encounter for screening for respiratory tuberculosis: Secondary | ICD-10-CM

## 2020-11-22 DIAGNOSIS — Z90710 Acquired absence of both cervix and uterus: Secondary | ICD-10-CM | POA: Insufficient documentation

## 2020-11-22 LAB — WET PREP FOR TRICH, YEAST, CLUE
Trichomonas Exam: NEGATIVE
Yeast Exam: NEGATIVE

## 2020-11-22 NOTE — Progress Notes (Signed)
[facility] Department STI clinic/screening visit  Subjective:  Diane Castillo is a 50 y.o. SBF G3P3 exsmoker female being seen today for an STI screening visit. The patient reports they do not have symptoms.  Patient reports that they do not desire a pregnancy in the next year.   They reported they are not interested in discussing contraception today.  Patient's last menstrual period was 11/23/2014.   Patient has the following medical conditions:   Patient Active Problem List   Diagnosis Date Noted  . Anxiety 01/27/2020  . BMI 40.0-44.9, adult (HCC) 12/22/2018  . Chronic hepatitis C without hepatic coma (HCC) 03/18/2017  . Primary localized osteoarthritis of right knee 10/30/2016  . Rupture of medial head of right gastrocnemius 11/15/2015  . Right leg pain 11/14/2015  . Gastrocnemius muscle strain 11/14/2015  . Hypokalemia 11/14/2015  . Metabolic acidosis 11/14/2015  . Hyperglycemia 11/14/2015  . Drug abuse (HCC) 11/14/2015  . Rhabdomyolysis 11/13/2015  . HTN (hypertension) 09/06/2014  . Asthma 04/24/2010  . Iron deficiency anemia 04/24/2010  . Personal history of venous thrombosis and embolism 04/24/2010  . Major depressive disorder, recurrent episode, moderate (HCC) 11/30/2009  . Human immunodeficiency virus (HIV) disease (HCC) 09/30/2000    Chief Complaint  Patient presents with  . SEXUALLY TRANSMITTED DISEASE    screening    HPI  Patient reports asymptomatic. LMP before hysterectomy and can't remember when.  Last sex 05/2020 with condom. Last cocaine 30 years ago. Last MJ 10/2020. Last ETOH 10/20/20 (1 Margarita) seldom. Last cigar 3 years ago.   Last HIV test per patient/review of record was 10/17/20 and is HIV + Patient reports last pap was 3 years ago   See flowsheet for further details and programmatic requirements.    The following portions of the patient's history were reviewed and updated as appropriate: allergies, current medications, past medical  history, past social history, past surgical history and problem list.  Objective:  There were no vitals filed for this visit.  Physical Exam Vitals and nursing note reviewed.  Constitutional:      Appearance: Normal appearance. She is obese.  HENT:     Head: Normocephalic and atraumatic.     Mouth/Throat:     Mouth: Mucous membranes are moist.     Pharynx: Oropharynx is clear. No oropharyngeal exudate or posterior oropharyngeal erythema.  Pulmonary:     Effort: Pulmonary effort is normal.  Chest:  Breasts:     Right: No axillary adenopathy or supraclavicular adenopathy.     Left: No axillary adenopathy or supraclavicular adenopathy.    Abdominal:     Palpations: There is no mass.     Tenderness: There is no abdominal tenderness. There is no rebound.     Comments: Soft without masses or tenderness  Genitourinary:    Pubic Area: No rash or pubic lice.      Labia:        Right: No rash or lesion.        Left: No rash or lesion.      Vagina: No vaginal discharge, erythema, bleeding or lesions.     Cervix: No cervical motion tenderness, discharge, friability, lesion or erythema.     Adnexa: Right adnexa normal and left adnexa normal.     Rectum: Normal.     Comments: Pt declines exam and wants to self collect specimens Lymphadenopathy:     Head:     Right side of head: No preauricular or posterior auricular adenopathy.  Left side of head: No preauricular or posterior auricular adenopathy.     Cervical: No cervical adenopathy.     Upper Body:     Right upper body: No supraclavicular or axillary adenopathy.     Left upper body: No supraclavicular or axillary adenopathy.     Lower Body: No right inguinal adenopathy. No left inguinal adenopathy.  Skin:    General: Skin is warm and dry.     Findings: No rash.  Neurological:     Mental Status: She is alert and oriented to person, place, and time.      Assessment and Plan:  Diane Castillo is a 50 y.o. female presenting  to the [facility] Department for STI screening  1. Screening examination for venereal disease Treat wet mount per standing orders Immunization nurse consult - WET PREP FOR TRICH, YEAST, CLUE - Chlamydia/Gonorrhea [facility] - HIV [facility] - Syphilis Serology,  Lab     No follow-ups on file.  Future Appointments  Date Time Provider Department Center  11/22/2020  3:00 PM [facility] NURSE [facility] None  11/25/2020 10:55 AM [facility] NURSE [facility] None    Alberteen Spindle, CNM

## 2020-11-22 NOTE — Progress Notes (Signed)
Wet mount reviewed, no tx per standing order. Provider orders completed. 

## 2020-11-23 DIAGNOSIS — M170 Bilateral primary osteoarthritis of knee: Secondary | ICD-10-CM | POA: Diagnosis not present

## 2020-11-24 DIAGNOSIS — M170 Bilateral primary osteoarthritis of knee: Secondary | ICD-10-CM | POA: Diagnosis not present

## 2020-11-25 ENCOUNTER — Ambulatory Visit (LOCAL_COMMUNITY_HEALTH_CENTER): Payer: Medicaid Other

## 2020-11-25 ENCOUNTER — Other Ambulatory Visit: Payer: Self-pay

## 2020-11-25 DIAGNOSIS — M170 Bilateral primary osteoarthritis of knee: Secondary | ICD-10-CM | POA: Diagnosis not present

## 2020-11-25 DIAGNOSIS — Z111 Encounter for screening for respiratory tuberculosis: Secondary | ICD-10-CM

## 2020-11-25 LAB — TB SKIN TEST
Induration: 0 mm
TB Skin Test: NEGATIVE

## 2020-11-26 DIAGNOSIS — M170 Bilateral primary osteoarthritis of knee: Secondary | ICD-10-CM | POA: Diagnosis not present

## 2020-11-27 DIAGNOSIS — M170 Bilateral primary osteoarthritis of knee: Secondary | ICD-10-CM | POA: Diagnosis not present

## 2020-11-28 DIAGNOSIS — M170 Bilateral primary osteoarthritis of knee: Secondary | ICD-10-CM | POA: Diagnosis not present

## 2020-11-29 DIAGNOSIS — M170 Bilateral primary osteoarthritis of knee: Secondary | ICD-10-CM | POA: Diagnosis not present

## 2020-12-01 DIAGNOSIS — M170 Bilateral primary osteoarthritis of knee: Secondary | ICD-10-CM | POA: Diagnosis not present

## 2020-12-02 DIAGNOSIS — M170 Bilateral primary osteoarthritis of knee: Secondary | ICD-10-CM | POA: Diagnosis not present

## 2020-12-03 DIAGNOSIS — M170 Bilateral primary osteoarthritis of knee: Secondary | ICD-10-CM | POA: Diagnosis not present

## 2020-12-04 DIAGNOSIS — M170 Bilateral primary osteoarthritis of knee: Secondary | ICD-10-CM | POA: Diagnosis not present

## 2020-12-05 DIAGNOSIS — M170 Bilateral primary osteoarthritis of knee: Secondary | ICD-10-CM | POA: Diagnosis not present

## 2020-12-06 DIAGNOSIS — M170 Bilateral primary osteoarthritis of knee: Secondary | ICD-10-CM | POA: Diagnosis not present

## 2020-12-07 DIAGNOSIS — M170 Bilateral primary osteoarthritis of knee: Secondary | ICD-10-CM | POA: Diagnosis not present

## 2020-12-08 DIAGNOSIS — M170 Bilateral primary osteoarthritis of knee: Secondary | ICD-10-CM | POA: Diagnosis not present

## 2020-12-09 DIAGNOSIS — M170 Bilateral primary osteoarthritis of knee: Secondary | ICD-10-CM | POA: Diagnosis not present

## 2020-12-10 DIAGNOSIS — M170 Bilateral primary osteoarthritis of knee: Secondary | ICD-10-CM | POA: Diagnosis not present

## 2020-12-11 DIAGNOSIS — M170 Bilateral primary osteoarthritis of knee: Secondary | ICD-10-CM | POA: Diagnosis not present

## 2020-12-12 DIAGNOSIS — M170 Bilateral primary osteoarthritis of knee: Secondary | ICD-10-CM | POA: Diagnosis not present

## 2020-12-13 DIAGNOSIS — M170 Bilateral primary osteoarthritis of knee: Secondary | ICD-10-CM | POA: Diagnosis not present

## 2020-12-14 DIAGNOSIS — M170 Bilateral primary osteoarthritis of knee: Secondary | ICD-10-CM | POA: Diagnosis not present

## 2020-12-17 DIAGNOSIS — M170 Bilateral primary osteoarthritis of knee: Secondary | ICD-10-CM | POA: Diagnosis not present

## 2020-12-20 ENCOUNTER — Other Ambulatory Visit: Payer: Medicaid Other

## 2020-12-20 DIAGNOSIS — M170 Bilateral primary osteoarthritis of knee: Secondary | ICD-10-CM | POA: Diagnosis not present

## 2020-12-21 DIAGNOSIS — M170 Bilateral primary osteoarthritis of knee: Secondary | ICD-10-CM | POA: Diagnosis not present

## 2020-12-23 ENCOUNTER — Other Ambulatory Visit: Payer: Self-pay

## 2020-12-23 ENCOUNTER — Ambulatory Visit (LOCAL_COMMUNITY_HEALTH_CENTER): Payer: Self-pay

## 2020-12-23 DIAGNOSIS — Z111 Encounter for screening for respiratory tuberculosis: Secondary | ICD-10-CM

## 2020-12-25 DIAGNOSIS — M170 Bilateral primary osteoarthritis of knee: Secondary | ICD-10-CM | POA: Diagnosis not present

## 2020-12-26 ENCOUNTER — Emergency Department
Admission: EM | Admit: 2020-12-26 | Discharge: 2020-12-26 | Disposition: A | Discharge status: Home / Self Care | Payer: Medicaid Other | Attending: Emergency Medicine | Admitting: Emergency Medicine

## 2020-12-26 ENCOUNTER — Encounter: Payer: Self-pay | Admitting: Emergency Medicine

## 2020-12-26 ENCOUNTER — Other Ambulatory Visit: Payer: Self-pay

## 2020-12-26 ENCOUNTER — Ambulatory Visit (LOCAL_COMMUNITY_HEALTH_CENTER): Payer: Medicaid Other

## 2020-12-26 ENCOUNTER — Emergency Department: Payer: Medicaid Other

## 2020-12-26 DIAGNOSIS — Y92512 Supermarket, store or market as the place of occurrence of the external cause: Secondary | ICD-10-CM | POA: Diagnosis not present

## 2020-12-26 DIAGNOSIS — S99912A Unspecified injury of left ankle, initial encounter: Secondary | ICD-10-CM | POA: Diagnosis not present

## 2020-12-26 DIAGNOSIS — J45909 Unspecified asthma, uncomplicated: Secondary | ICD-10-CM | POA: Insufficient documentation

## 2020-12-26 DIAGNOSIS — Y9389 Activity, other specified: Secondary | ICD-10-CM | POA: Diagnosis not present

## 2020-12-26 DIAGNOSIS — Z87891 Personal history of nicotine dependence: Secondary | ICD-10-CM | POA: Diagnosis not present

## 2020-12-26 DIAGNOSIS — I10 Essential (primary) hypertension: Secondary | ICD-10-CM | POA: Insufficient documentation

## 2020-12-26 DIAGNOSIS — Z7982 Long term (current) use of aspirin: Secondary | ICD-10-CM | POA: Diagnosis not present

## 2020-12-26 DIAGNOSIS — S93492A Sprain of other ligament of left ankle, initial encounter: Secondary | ICD-10-CM | POA: Diagnosis not present

## 2020-12-26 DIAGNOSIS — Z111 Encounter for screening for respiratory tuberculosis: Secondary | ICD-10-CM

## 2020-12-26 DIAGNOSIS — W208XXA Other cause of strike by thrown, projected or falling object, initial encounter: Secondary | ICD-10-CM | POA: Diagnosis not present

## 2020-12-26 DIAGNOSIS — Z79899 Other long term (current) drug therapy: Secondary | ICD-10-CM | POA: Diagnosis not present

## 2020-12-26 LAB — TB SKIN TEST
Induration: 0 mm
TB Skin Test: NEGATIVE

## 2020-12-26 MED ORDER — IBUPROFEN 800 MG PO TABS
800.0000 mg | ORAL_TABLET | Freq: Once | ORAL | Status: AC
  Administered 2020-12-26: 800 mg via ORAL
  Filled 2020-12-26: qty 1

## 2020-12-26 MED ORDER — MELOXICAM 15 MG PO TABS: 15.0000 mg | ORAL_TABLET | Freq: Every day | ORAL | 2 refills | Status: AC

## 2020-12-26 MED ORDER — MELOXICAM 15 MG PO TABS: 15.0000 mg | ORAL_TABLET | Freq: Every day | ORAL | 2 refills | Status: DC

## 2020-12-26 NOTE — ED Triage Notes (Signed)
Left ankle injury.  Glass jar fell and  hit ankle.  Arrives from [facility] for evaluation.

## 2020-12-26 NOTE — Discharge Instructions (Addendum)
You can take Meloxicam once daily.

## 2020-12-26 NOTE — ED Notes (Signed)
Pt's L foot warm; swelling noted; can wiggle toes; dorsalis pedis pulse 2+.

## 2020-12-26 NOTE — ED Provider Notes (Signed)
[facility]-EMERGENCY DEPARTMENT  ____________________________________________  Time seen: Approximately 10:24 PM  I have reviewed the triage vital signs and the nursing notes.   HISTORY  Chief Complaint Ankle Injury   Historian Patient     HPI Diane Castillo is a 50 y.o. female presents to the emergency department with left lateral ankle pain.  Patient reports that [facility] jars [facility] and fell [facility] her ankle.  Patient has no abrasions or lacerations.  She denies similar injuries in the past.   Past Medical History:  Diagnosis Date   Asthma    DVT (deep venous thrombosis) (HCC)    right leg   Hypertension    Immune deficiency disorder (HCC)      Immunizations up to date:  Yes.     Past Medical History:  Diagnosis Date   Asthma    DVT (deep venous thrombosis) (HCC)    right leg   Hypertension    Immune deficiency disorder [facility](HCC)     Patient Active Problem List   Diagnosis Date Noted   H/O: hysterectomy 11/22/2020   Anxiety 01/27/2020   BMI 40.0-44.9, adult (HCC); 186 lbs 12/22/2018   Chronic hepatitis C without hepatic coma (HCC) 03/18/2017   Primary localized osteoarthritis of right knee 10/30/2016   Rupture of medial head of right gastrocnemius 11/15/2015   Right leg pain 11/14/2015   Gastrocnemius muscle strain 11/14/2015   Hypokalemia 11/14/2015   Metabolic acidosis 11/14/2015   Hyperglycemia 11/14/2015   Drug abuse (HCC) cocaine, MJ 11/14/2015   Rhabdomyolysis 11/13/2015   HTN (hypertension) 09/06/2014   Asthma 04/24/2010   Iron deficiency anemia 04/24/2010   Personal history of venous thrombosis and embolism 04/24/2010   Major depressive disorder, recurrent episode, moderate (HCC) 11/30/2009   Human immunodeficiency virus (HIV) disease (HCC) 1996 09/30/2000    Past Surgical History:  Procedure Laterality Date   ABDOMINAL HYSTERECTOMY     CHOLECYSTECTOMY     HAND SURGERY     HERNIA REPAIR  2014   ventral   KNEE  CLOSED REDUCTION Right 12/06/2016   Procedure: CLOSED MANIPULATION KNEE;  Surgeon: Kennedy BuckerMenz, Michael, MD;  Location: [facility] ORS;  Service: Orthopedics;  Laterality: Right;   KNEE SURGERY     ORIF FINGER FRACTURE Left    middle finger, metal placed   TUBAL LIGATION      Prior to Admission medications   Medication Sig Start Date End Date Taking? Authorizing Provider  acetaminophen (TYLENOL) 500 MG tablet Take 1,000 mg by mouth daily as needed for moderate pain or headache.    [provider]  albuterol (VENTOLIN HFA) 108 (90 Base) MCG/ACT inhaler Inhale 2 puffs into the lungs every 6 (six) hours as needed for wheezing or shortness of breath.  10/03/15   [provider]  aspirin EC 81 MG tablet Take 81 mg by mouth daily.     [provider]  Aspirin-Salicylamide-Caffeine (ARTHRITIS STRENGTH BC POWDER PO) Take 1 packet by mouth daily as needed (pain).    [provider]  azithromycin (ZITHROMAX) 250 MG tablet Take by mouth daily.    [provider]  bictegravir-emtricitabine-tenofovir AF (BIKTARVY) 50-200-25 MG TABS tablet Take 1 tablet by mouth daily. 12/07/19   [provider]  diazepam (VALIUM) 5 MG tablet Take 5 mg by mouth every 8 (eight) hours as needed. Patient not taking: Reported on 11/22/2020 02/28/20   [provider]  diphenhydrAMINE (BENADRYL) 25 MG tablet Take 25 mg by mouth daily as needed for allergies.  [provider]  docusate sodium (COLACE) 100 MG capsule Take 1 capsule (100 mg total) by mouth 2 (two) times daily. Patient taking differently: Take 100 mg by mouth daily.  11/14/15   Katharina Caper, MD  dolutegravir (TIVICAY) 50 MG tablet Take 50 mg by mouth daily. 05/16/15   [provider]  emtricitabine-tenofovir AF (DESCOVY) 200-25 MG tablet Take 1 tablet by mouth daily.    [provider]  enalapril (VASOTEC) 10 MG tablet Take 10 mg by mouth daily.    [provider]  esomeprazole  (NEXIUM) 40 MG capsule Take 40 mg by mouth 2 (two) times daily. 01/06/20   [provider]  gabapentin (NEURONTIN) 300 MG capsule Take 1 capsule by mouth daily. Patient not taking: Reported on 11/22/2020 01/15/19   [provider]  HYDROcodone-acetaminophen (NORCO/VICODIN) 5-325 MG tablet Take 1 tablet by mouth every 8 (eight) hours as needed for moderate pain. 06/01/17   Tommi Rumps, PA-C  ketorolac (TORADOL) 10 MG tablet Take 1 tablet (10 mg total) by mouth every 8 (eight) hours as needed for moderate pain. 06/01/17   Tommi Rumps, PA-C  Lidocaine 0.5 % GEL Apply 1 application topically 3 (three) times daily. 09/28/17   Enid Derry, PA-C  meloxicam (MOBIC) 15 MG tablet Take 1 tablet (15 mg total) by mouth daily. 12/26/20 12/26/21  Orvil Feil, PA-C  Menthol, Topical Analgesic, (ICY HOT EX) Apply 1 application topically daily as needed (pain).    [provider]  metroNIDAZOLE (FLAGYL) 500 MG tablet Take 1 tablet (500 mg total) by mouth 2 (two) times daily. Patient not taking: Reported on 11/22/2020 03/28/20   Matt Holmes, PA  oxyCODONE-acetaminophen (PERCOCET/ROXICET) 5-325 MG tablet Take 1 tablet by mouth every 6 (six) hours as needed for severe pain. 07/13/17   Joni Reining, PA-C  QVAR REDIHALER 40 MCG/ACT inhaler SMARTSIG:2 Puff(s) By Mouth Twice Daily 02/04/20   [provider]  sertraline (ZOLOFT) 50 MG tablet Take 1/2 pill by mouth daily for 1 week then increase to 1 pill daily Patient not taking: Reported on 11/22/2020 01/27/20   [provider]  valACYclovir (VALTREX) 500 MG tablet Take 500 mg by mouth 2 (two) times daily. 10/03/15   [provider]    Allergies Penicillins and Tramadol  Family History  Problem Relation Age of Onset   Heart disease Mother     Social History Social History   Tobacco Use   Smoking status: Former    Packs/day: 0.25    Pack years: 0.00    Types: Cigarettes    Quit date: 09/15/2016     Years since quitting: 4.2   Smokeless tobacco: Never  Vaping Use   Vaping Use: Never used  Substance Use Topics   Alcohol use: No   Drug use: Not Currently    Types: Marijuana, Cocaine    Comment: per patient, last use was years ago of either substance     Review of Systems  Constitutional: No fever/chills Eyes:  No discharge ENT: No upper respiratory complaints. Respiratory: no cough. No SOB/ use of accessory muscles to breath Gastrointestinal:   No nausea, no vomiting.  No diarrhea.  No constipation. Musculoskeletal: Patient has left ankle pain.  Skin: Negative for rash, abrasions, lacerations, ecchymosis.    ____________________________________________   PHYSICAL EXAM:  VITAL SIGNS: ED Triage Vitals  Enc Vitals Group     BP 12/26/20 1802 104/74     Pulse Rate 12/26/20 1802 73  Resp 12/26/20 1802 18     Temp 12/26/20 1802 99.2 F (37.3 C)     Temp Source 12/26/20 1802 Oral     SpO2 12/26/20 1802 98 %     Weight 12/26/20 1801 186 lb 1.1 oz (84.4 kg)     Height 12/26/20 1801 5\' 5"  (1.651 m)     Head Circumference --      Peak Flow --      Pain Score 12/26/20 1801 10     Pain Loc --      Pain Edu? --      Excl. in GC? --      Constitutional: Alert and oriented. Well appearing and in no acute distress. Eyes: Conjunctivae are normal. PERRL. EOMI. Head: Atraumatic. ENT: Cardiovascular: Normal rate, regular rhythm. Normal S1 and S2.  Good peripheral circulation. Respiratory: Normal respiratory effort without tachypnea or retractions. Lungs CTAB. Good air entry to the bases with no decreased or absent breath sounds Gastrointestinal: Bowel sounds x 4 quadrants. Soft and nontender to palpation. No guarding or rigidity. No distention. Musculoskeletal: Full range of motion to all extremities. No obvious deformities noted. Neurologic:  Normal for age. No gross focal neurologic deficits are appreciated.  Skin:  Skin is warm, dry and intact. No rash  noted. Psychiatric: Mood and affect are normal for age. Speech and behavior are normal.   ____________________________________________   LABS (all labs ordered are listed, but only abnormal results are displayed)  Labs Reviewed - No data to display ____________________________________________  EKG   ____________________________________________  RADIOLOGY 02/26/21, personally viewed and evaluated these images (plain radiographs) as part of my medical decision making, as well as reviewing the written report by the radiologist.  DG Ankle Complete Left  Result Date: 12/26/2020 CLINICAL DATA:  /are fell and struck ankle EXAM: LEFT ANKLE COMPLETE - 3+ VIEW COMPARISON:  None. FINDINGS: Corticated ossifications seen adjacent the medial malleolus and distance of the lateral malleolus have an appearance most compatible with remote injury. No acute osseous abnormality is seen, specifically no fracture or traumatic subluxation or dislocation. Minimal degenerative changes about the ankle and hindfoot. No sizable effusion or other significant soft tissue abnormality. IMPRESSION: No acute osseous abnormality. Corticated mineralization adjacent the tips of the medial and lateral malleoli, favor sequela of more remote injury. Electronically Signed   By: 02/26/2021 M.D.   On: 12/26/2020 19:48    ____________________________________________    PROCEDURES  Procedure(s) performed:     Procedures     Medications  ibuprofen (ADVIL) tablet 800 mg (has no administration in time range)     ____________________________________________   INITIAL IMPRESSION / ASSESSMENT AND PLAN / ED COURSE  Pertinent labs & imaging results that were available during my care of the patient were reviewed by me and considered in my medical decision making (see chart for details).      Assessment and plan Ankle pain 50 year old female presents to the emergency department with left lateral ankle pain  after several jars fell while shopping at [place].  X-ray shows no acute bony abnormality.  Patient was discharged with meloxicam and given ibuprofen 800 while in the emergency department.  All patient questions were answered.     ____________________________________________  FINAL CLINICAL IMPRESSION(S) / ED DIAGNOSES  Final diagnoses:  Sprain of anterior talofibular ligament of left ankle, initial encounter      NEW MEDICATIONS STARTED DURING THIS VISIT:  ED Discharge Orders  Ordered    meloxicam (MOBIC) 15 MG tablet  Daily,   Status:  Discontinued        12/26/20 2218    meloxicam (MOBIC) 15 MG tablet  Daily        12/26/20 2223                This chart was dictated using voice recognition software/Dragon. Despite best efforts to proofread, errors can occur which can change the meaning. Any change was purely unintentional.     Gasper Lloyd 12/26/20 2228    Sharman Cheek, MD 12/26/20 2356

## 2021-01-03 DIAGNOSIS — M25572 Pain in left ankle and joints of left foot: Secondary | ICD-10-CM | POA: Diagnosis not present

## 2021-01-03 DIAGNOSIS — Z1211 Encounter for screening for malignant neoplasm of colon: Secondary | ICD-10-CM | POA: Diagnosis not present

## 2021-01-03 DIAGNOSIS — R011 Cardiac murmur, unspecified: Secondary | ICD-10-CM | POA: Diagnosis not present

## 2021-01-03 DIAGNOSIS — S93402D Sprain of unspecified ligament of left ankle, subsequent encounter: Secondary | ICD-10-CM | POA: Diagnosis not present

## 2021-01-03 DIAGNOSIS — I10 Essential (primary) hypertension: Secondary | ICD-10-CM | POA: Diagnosis not present

## 2021-01-04 DIAGNOSIS — J329 Chronic sinusitis, unspecified: Secondary | ICD-10-CM | POA: Diagnosis not present

## 2021-01-04 DIAGNOSIS — J309 Allergic rhinitis, unspecified: Secondary | ICD-10-CM | POA: Diagnosis not present

## 2021-01-04 DIAGNOSIS — B20 Human immunodeficiency virus [HIV] disease: Secondary | ICD-10-CM | POA: Diagnosis not present

## 2021-02-03 DIAGNOSIS — M1712 Unilateral primary osteoarthritis, left knee: Secondary | ICD-10-CM | POA: Diagnosis not present

## 2021-02-03 DIAGNOSIS — M79605 Pain in left leg: Secondary | ICD-10-CM | POA: Diagnosis not present

## 2021-02-03 DIAGNOSIS — S93402D Sprain of unspecified ligament of left ankle, subsequent encounter: Secondary | ICD-10-CM | POA: Diagnosis not present

## 2021-02-03 DIAGNOSIS — M25572 Pain in left ankle and joints of left foot: Secondary | ICD-10-CM | POA: Diagnosis not present

## 2021-04-28 DIAGNOSIS — J069 Acute upper respiratory infection, unspecified: Secondary | ICD-10-CM | POA: Diagnosis not present

## 2021-04-28 DIAGNOSIS — B20 Human immunodeficiency virus [HIV] disease: Secondary | ICD-10-CM | POA: Diagnosis not present

## 2021-05-25 ENCOUNTER — Telehealth: Payer: Self-pay | Admitting: Internal Medicine

## 2021-05-25 NOTE — Telephone Encounter (Signed)
..   Medicaid Managed Care   Unsuccessful Outreach Note  05/25/2021 Name: Diane Castillo MRN: [id number] DOB: [birth year]  Referred by: Danielle Dess, MD Reason for referral : High Risk Managed Medicaid (Called patient today to get her scheduled for a phone visit with the MM Team. I left my name and number on her VM.)   An unsuccessful telephone outreach was attempted today. The patient was referred to the case management team for assistance with care management and care coordination.   Follow Up Plan: The care management team will reach out to the patient again over the next 14 days.   Weston Settle Care Guide, High Risk Medicaid Managed Care Embedded Care Coordination [facility]  [facility]

## 2021-05-31 DIAGNOSIS — M170 Bilateral primary osteoarthritis of knee: Secondary | ICD-10-CM | POA: Diagnosis not present

## 2021-06-01 DIAGNOSIS — M170 Bilateral primary osteoarthritis of knee: Secondary | ICD-10-CM | POA: Diagnosis not present

## 2021-06-02 DIAGNOSIS — M170 Bilateral primary osteoarthritis of knee: Secondary | ICD-10-CM | POA: Diagnosis not present

## 2021-06-04 DIAGNOSIS — M170 Bilateral primary osteoarthritis of knee: Secondary | ICD-10-CM | POA: Diagnosis not present

## 2021-06-05 DIAGNOSIS — M170 Bilateral primary osteoarthritis of knee: Secondary | ICD-10-CM | POA: Diagnosis not present

## 2021-06-06 DIAGNOSIS — M170 Bilateral primary osteoarthritis of knee: Secondary | ICD-10-CM | POA: Diagnosis not present

## 2021-06-07 DIAGNOSIS — M170 Bilateral primary osteoarthritis of knee: Secondary | ICD-10-CM | POA: Diagnosis not present

## 2021-06-08 DIAGNOSIS — M170 Bilateral primary osteoarthritis of knee: Secondary | ICD-10-CM | POA: Diagnosis not present

## 2021-06-09 DIAGNOSIS — M170 Bilateral primary osteoarthritis of knee: Secondary | ICD-10-CM | POA: Diagnosis not present

## 2021-06-10 DIAGNOSIS — M170 Bilateral primary osteoarthritis of knee: Secondary | ICD-10-CM | POA: Diagnosis not present

## 2021-06-11 DIAGNOSIS — M170 Bilateral primary osteoarthritis of knee: Secondary | ICD-10-CM | POA: Diagnosis not present

## 2021-06-12 DIAGNOSIS — M170 Bilateral primary osteoarthritis of knee: Secondary | ICD-10-CM | POA: Diagnosis not present

## 2021-06-13 DIAGNOSIS — M170 Bilateral primary osteoarthritis of knee: Secondary | ICD-10-CM | POA: Diagnosis not present

## 2021-06-14 DIAGNOSIS — M170 Bilateral primary osteoarthritis of knee: Secondary | ICD-10-CM | POA: Diagnosis not present

## 2021-06-14 DIAGNOSIS — J029 Acute pharyngitis, unspecified: Secondary | ICD-10-CM | POA: Diagnosis not present

## 2021-06-14 DIAGNOSIS — J069 Acute upper respiratory infection, unspecified: Secondary | ICD-10-CM | POA: Diagnosis not present

## 2021-06-16 DIAGNOSIS — M170 Bilateral primary osteoarthritis of knee: Secondary | ICD-10-CM | POA: Diagnosis not present

## 2021-06-19 DIAGNOSIS — M170 Bilateral primary osteoarthritis of knee: Secondary | ICD-10-CM | POA: Diagnosis not present

## 2021-06-20 DIAGNOSIS — M170 Bilateral primary osteoarthritis of knee: Secondary | ICD-10-CM | POA: Diagnosis not present

## 2021-06-21 DIAGNOSIS — M170 Bilateral primary osteoarthritis of knee: Secondary | ICD-10-CM | POA: Diagnosis not present

## 2021-06-22 DIAGNOSIS — M170 Bilateral primary osteoarthritis of knee: Secondary | ICD-10-CM | POA: Diagnosis not present

## 2021-06-24 DIAGNOSIS — M170 Bilateral primary osteoarthritis of knee: Secondary | ICD-10-CM | POA: Diagnosis not present

## 2021-06-26 DIAGNOSIS — M170 Bilateral primary osteoarthritis of knee: Secondary | ICD-10-CM | POA: Diagnosis not present

## 2021-06-27 DIAGNOSIS — M170 Bilateral primary osteoarthritis of knee: Secondary | ICD-10-CM | POA: Diagnosis not present

## 2021-06-28 DIAGNOSIS — M170 Bilateral primary osteoarthritis of knee: Secondary | ICD-10-CM | POA: Diagnosis not present

## 2021-07-03 DIAGNOSIS — M170 Bilateral primary osteoarthritis of knee: Secondary | ICD-10-CM | POA: Diagnosis not present

## 2021-07-04 DIAGNOSIS — M170 Bilateral primary osteoarthritis of knee: Secondary | ICD-10-CM | POA: Diagnosis not present

## 2021-07-05 DIAGNOSIS — M170 Bilateral primary osteoarthritis of knee: Secondary | ICD-10-CM | POA: Diagnosis not present

## 2021-07-06 DIAGNOSIS — M170 Bilateral primary osteoarthritis of knee: Secondary | ICD-10-CM | POA: Diagnosis not present

## 2021-07-07 DIAGNOSIS — M170 Bilateral primary osteoarthritis of knee: Secondary | ICD-10-CM | POA: Diagnosis not present

## 2021-07-08 DIAGNOSIS — M170 Bilateral primary osteoarthritis of knee: Secondary | ICD-10-CM | POA: Diagnosis not present

## 2021-07-10 DIAGNOSIS — M170 Bilateral primary osteoarthritis of knee: Secondary | ICD-10-CM | POA: Diagnosis not present

## 2021-07-11 ENCOUNTER — Telehealth: Payer: Self-pay | Admitting: Internal Medicine

## 2021-07-11 DIAGNOSIS — M170 Bilateral primary osteoarthritis of knee: Secondary | ICD-10-CM | POA: Diagnosis not present

## 2021-07-11 NOTE — Telephone Encounter (Signed)
.. °  Medicaid Managed Care   Unsuccessful Outreach Note  07/11/2021 Name: Diane Castillo MRN: [id number] DOB: [birth year]  Referred by: Sol Passer, MD Reason for referral : High Risk Managed Medicaid (I called the patient today to get her scheduled with the MM Team. I left my name and number on her VM.)   A second unsuccessful telephone outreach was attempted today. The patient was referred to the case management team for assistance with care management and care coordination.   Follow Up Plan: The care management team will reach out to the patient again over the next 14 days.   [place]

## 2021-07-15 DIAGNOSIS — M170 Bilateral primary osteoarthritis of knee: Secondary | ICD-10-CM | POA: Diagnosis not present

## 2021-07-24 DIAGNOSIS — M170 Bilateral primary osteoarthritis of knee: Secondary | ICD-10-CM | POA: Diagnosis not present

## 2021-07-26 DIAGNOSIS — M170 Bilateral primary osteoarthritis of knee: Secondary | ICD-10-CM | POA: Diagnosis not present

## 2021-07-29 DIAGNOSIS — M170 Bilateral primary osteoarthritis of knee: Secondary | ICD-10-CM | POA: Diagnosis not present

## 2021-08-02 DIAGNOSIS — M170 Bilateral primary osteoarthritis of knee: Secondary | ICD-10-CM | POA: Diagnosis not present

## 2021-08-03 ENCOUNTER — Telehealth: Payer: Self-pay | Admitting: Internal Medicine

## 2021-08-03 NOTE — Telephone Encounter (Signed)
.. °  Medicaid Managed Care   Unsuccessful Outreach Note  08/03/2021 Name: Diane Castillo MRN: [id number] DOB: [birth year]  Referred by: Danielle Dess, MD Reason for referral : High Risk Managed Medicaid (Third attempt to reach patient today to get her scheduled with the MM Team. I left my name and number on her VM.)   Third unsuccessful telephone outreach was attempted today. The patient was referred to the case management team for assistance with care management and care coordination. The patient's primary care provider has been notified of our unsuccessful attempts to make or maintain contact with the patient. The care management team is pleased to engage with this patient at any time in the future should he/she be interested in assistance from the care management team.   Follow Up Plan: We have been unable to make contact with the patient for follow up. The care management team is available to follow up with the patient after provider conversation with the patient regarding recommendation for care management engagement and subsequent re-referral to the care management team.   Weston Settle Care Guide, High Risk Medicaid Managed Care Embedded Care Coordination [facility]   [facility]

## 2021-08-06 DIAGNOSIS — M170 Bilateral primary osteoarthritis of knee: Secondary | ICD-10-CM | POA: Diagnosis not present

## 2021-08-07 DIAGNOSIS — M170 Bilateral primary osteoarthritis of knee: Secondary | ICD-10-CM | POA: Diagnosis not present

## 2021-08-09 DIAGNOSIS — M170 Bilateral primary osteoarthritis of knee: Secondary | ICD-10-CM | POA: Diagnosis not present

## 2021-08-10 DIAGNOSIS — M170 Bilateral primary osteoarthritis of knee: Secondary | ICD-10-CM | POA: Diagnosis not present

## 2021-08-11 DIAGNOSIS — M170 Bilateral primary osteoarthritis of knee: Secondary | ICD-10-CM | POA: Diagnosis not present

## 2021-08-14 DIAGNOSIS — M170 Bilateral primary osteoarthritis of knee: Secondary | ICD-10-CM | POA: Diagnosis not present

## 2021-08-15 DIAGNOSIS — M170 Bilateral primary osteoarthritis of knee: Secondary | ICD-10-CM | POA: Diagnosis not present

## 2021-08-16 DIAGNOSIS — M170 Bilateral primary osteoarthritis of knee: Secondary | ICD-10-CM | POA: Diagnosis not present

## 2021-08-17 DIAGNOSIS — M170 Bilateral primary osteoarthritis of knee: Secondary | ICD-10-CM | POA: Diagnosis not present

## 2021-08-18 DIAGNOSIS — M170 Bilateral primary osteoarthritis of knee: Secondary | ICD-10-CM | POA: Diagnosis not present

## 2021-08-19 DIAGNOSIS — M170 Bilateral primary osteoarthritis of knee: Secondary | ICD-10-CM | POA: Diagnosis not present

## 2021-08-20 DIAGNOSIS — M170 Bilateral primary osteoarthritis of knee: Secondary | ICD-10-CM | POA: Diagnosis not present

## 2021-08-21 DIAGNOSIS — M170 Bilateral primary osteoarthritis of knee: Secondary | ICD-10-CM | POA: Diagnosis not present

## 2021-08-22 DIAGNOSIS — M170 Bilateral primary osteoarthritis of knee: Secondary | ICD-10-CM | POA: Diagnosis not present

## 2021-08-24 DIAGNOSIS — M170 Bilateral primary osteoarthritis of knee: Secondary | ICD-10-CM | POA: Diagnosis not present

## 2021-08-25 DIAGNOSIS — M170 Bilateral primary osteoarthritis of knee: Secondary | ICD-10-CM | POA: Diagnosis not present

## 2021-08-26 DIAGNOSIS — M170 Bilateral primary osteoarthritis of knee: Secondary | ICD-10-CM | POA: Diagnosis not present

## 2021-08-28 DIAGNOSIS — M170 Bilateral primary osteoarthritis of knee: Secondary | ICD-10-CM | POA: Diagnosis not present

## 2021-08-29 DIAGNOSIS — M170 Bilateral primary osteoarthritis of knee: Secondary | ICD-10-CM | POA: Diagnosis not present

## 2021-08-31 DIAGNOSIS — M170 Bilateral primary osteoarthritis of knee: Secondary | ICD-10-CM | POA: Diagnosis not present

## 2021-09-01 DIAGNOSIS — M170 Bilateral primary osteoarthritis of knee: Secondary | ICD-10-CM | POA: Diagnosis not present

## 2021-10-24 ENCOUNTER — Other Ambulatory Visit: Payer: Self-pay

## 2021-10-24 NOTE — Patient Instructions (Signed)
Thank you for speaking with me today regarding care management and care coordination needs.   ?

## 2021-10-24 NOTE — Patient Outreach (Signed)
Care Coordination ? ?10/24/2021 ? ?Staci Acosta ?Nov 14, 1970 ?[id number] ? ?BSW completed a telephone outreach with patient. She stated she does go to [facility] and her case worker is Harvel Ricks. BSW did provide patient with some resources and informed she would have to close the case due to her receiving services with [facility]. Patient understood. ? ? ?Gus Puma, BSW, [place] ?[facility]  ?High Risk Managed Medicaid Team  ?[phone]  ?

## 2021-10-26 DIAGNOSIS — S6992XA Unspecified injury of left wrist, hand and finger(s), initial encounter: Secondary | ICD-10-CM | POA: Diagnosis not present

## 2021-10-26 DIAGNOSIS — M542 Cervicalgia: Secondary | ICD-10-CM | POA: Diagnosis not present

## 2021-10-26 DIAGNOSIS — S3992XA Unspecified injury of lower back, initial encounter: Secondary | ICD-10-CM | POA: Diagnosis not present

## 2021-10-26 DIAGNOSIS — M47812 Spondylosis without myelopathy or radiculopathy, cervical region: Secondary | ICD-10-CM | POA: Diagnosis not present

## 2021-10-26 DIAGNOSIS — M549 Dorsalgia, unspecified: Secondary | ICD-10-CM | POA: Diagnosis not present

## 2021-10-26 DIAGNOSIS — M47816 Spondylosis without myelopathy or radiculopathy, lumbar region: Secondary | ICD-10-CM | POA: Diagnosis not present

## 2021-10-26 DIAGNOSIS — R911 Solitary pulmonary nodule: Secondary | ICD-10-CM | POA: Diagnosis not present

## 2021-10-26 DIAGNOSIS — M4316 Spondylolisthesis, lumbar region: Secondary | ICD-10-CM | POA: Diagnosis not present

## 2021-10-26 DIAGNOSIS — M25432 Effusion, left wrist: Secondary | ICD-10-CM | POA: Diagnosis not present

## 2021-10-26 DIAGNOSIS — R1011 Right upper quadrant pain: Secondary | ICD-10-CM | POA: Diagnosis not present

## 2021-10-26 DIAGNOSIS — J9811 Atelectasis: Secondary | ICD-10-CM | POA: Diagnosis not present

## 2021-10-26 DIAGNOSIS — S032XXA Dislocation of tooth, initial encounter: Secondary | ICD-10-CM | POA: Diagnosis not present

## 2021-10-26 DIAGNOSIS — S59912A Unspecified injury of left forearm, initial encounter: Secondary | ICD-10-CM | POA: Diagnosis not present

## 2021-10-26 DIAGNOSIS — R0789 Other chest pain: Secondary | ICD-10-CM | POA: Diagnosis not present

## 2021-10-26 DIAGNOSIS — S01511A Laceration without foreign body of lip, initial encounter: Secondary | ICD-10-CM | POA: Diagnosis not present

## 2021-10-26 DIAGNOSIS — S0990XA Unspecified injury of head, initial encounter: Secondary | ICD-10-CM | POA: Diagnosis not present

## 2021-10-26 DIAGNOSIS — S199XXA Unspecified injury of neck, initial encounter: Secondary | ICD-10-CM | POA: Diagnosis not present

## 2021-10-26 DIAGNOSIS — S299XXA Unspecified injury of thorax, initial encounter: Secondary | ICD-10-CM | POA: Diagnosis not present

## 2021-10-27 DIAGNOSIS — R9431 Abnormal electrocardiogram [ECG] [EKG]: Secondary | ICD-10-CM | POA: Diagnosis not present

## 2021-10-28 DIAGNOSIS — T783XXA Angioneurotic edema, initial encounter: Secondary | ICD-10-CM | POA: Diagnosis not present

## 2021-10-30 DIAGNOSIS — R220 Localized swelling, mass and lump, head: Secondary | ICD-10-CM | POA: Diagnosis not present

## 2021-11-20 DIAGNOSIS — M170 Bilateral primary osteoarthritis of knee: Secondary | ICD-10-CM | POA: Diagnosis not present

## 2021-11-21 DIAGNOSIS — M170 Bilateral primary osteoarthritis of knee: Secondary | ICD-10-CM | POA: Diagnosis not present

## 2021-11-23 DIAGNOSIS — M170 Bilateral primary osteoarthritis of knee: Secondary | ICD-10-CM | POA: Diagnosis not present

## 2021-11-24 DIAGNOSIS — M170 Bilateral primary osteoarthritis of knee: Secondary | ICD-10-CM | POA: Diagnosis not present

## 2021-11-25 DIAGNOSIS — M170 Bilateral primary osteoarthritis of knee: Secondary | ICD-10-CM | POA: Diagnosis not present

## 2021-11-26 DIAGNOSIS — M170 Bilateral primary osteoarthritis of knee: Secondary | ICD-10-CM | POA: Diagnosis not present

## 2021-11-26 DIAGNOSIS — F411 Generalized anxiety disorder: Secondary | ICD-10-CM | POA: Diagnosis not present

## 2021-11-27 DIAGNOSIS — M170 Bilateral primary osteoarthritis of knee: Secondary | ICD-10-CM | POA: Diagnosis not present

## 2021-11-28 DIAGNOSIS — F411 Generalized anxiety disorder: Secondary | ICD-10-CM | POA: Diagnosis not present

## 2021-11-29 DIAGNOSIS — F411 Generalized anxiety disorder: Secondary | ICD-10-CM | POA: Diagnosis not present

## 2021-11-29 DIAGNOSIS — M170 Bilateral primary osteoarthritis of knee: Secondary | ICD-10-CM | POA: Diagnosis not present

## 2021-11-30 DIAGNOSIS — M170 Bilateral primary osteoarthritis of knee: Secondary | ICD-10-CM | POA: Diagnosis not present

## 2021-11-30 DIAGNOSIS — F411 Generalized anxiety disorder: Secondary | ICD-10-CM | POA: Diagnosis not present

## 2021-11-30 DIAGNOSIS — H5213 Myopia, bilateral: Secondary | ICD-10-CM | POA: Diagnosis not present

## 2021-12-01 DIAGNOSIS — F411 Generalized anxiety disorder: Secondary | ICD-10-CM | POA: Diagnosis not present

## 2021-12-02 DIAGNOSIS — F411 Generalized anxiety disorder: Secondary | ICD-10-CM | POA: Diagnosis not present

## 2021-12-02 DIAGNOSIS — M170 Bilateral primary osteoarthritis of knee: Secondary | ICD-10-CM | POA: Diagnosis not present

## 2021-12-04 DIAGNOSIS — M170 Bilateral primary osteoarthritis of knee: Secondary | ICD-10-CM | POA: Diagnosis not present

## 2021-12-04 DIAGNOSIS — F411 Generalized anxiety disorder: Secondary | ICD-10-CM | POA: Diagnosis not present

## 2021-12-05 DIAGNOSIS — M170 Bilateral primary osteoarthritis of knee: Secondary | ICD-10-CM | POA: Diagnosis not present

## 2021-12-05 DIAGNOSIS — F411 Generalized anxiety disorder: Secondary | ICD-10-CM | POA: Diagnosis not present

## 2021-12-06 DIAGNOSIS — F411 Generalized anxiety disorder: Secondary | ICD-10-CM | POA: Diagnosis not present

## 2021-12-07 DIAGNOSIS — F411 Generalized anxiety disorder: Secondary | ICD-10-CM | POA: Diagnosis not present

## 2021-12-08 DIAGNOSIS — F411 Generalized anxiety disorder: Secondary | ICD-10-CM | POA: Diagnosis not present

## 2021-12-09 DIAGNOSIS — F411 Generalized anxiety disorder: Secondary | ICD-10-CM | POA: Diagnosis not present

## 2021-12-10 DIAGNOSIS — M170 Bilateral primary osteoarthritis of knee: Secondary | ICD-10-CM | POA: Diagnosis not present

## 2021-12-11 DIAGNOSIS — F411 Generalized anxiety disorder: Secondary | ICD-10-CM | POA: Diagnosis not present

## 2021-12-11 DIAGNOSIS — M170 Bilateral primary osteoarthritis of knee: Secondary | ICD-10-CM | POA: Diagnosis not present

## 2021-12-12 DIAGNOSIS — F411 Generalized anxiety disorder: Secondary | ICD-10-CM | POA: Diagnosis not present

## 2021-12-12 DIAGNOSIS — M170 Bilateral primary osteoarthritis of knee: Secondary | ICD-10-CM | POA: Diagnosis not present

## 2021-12-13 ENCOUNTER — Emergency Department
Admission: EM | Admit: 2021-12-13 | Discharge: 2021-12-13 | Disposition: A | Discharge status: Home / Self Care | Payer: Medicaid Other | Attending: Emergency Medicine | Admitting: Emergency Medicine

## 2021-12-13 ENCOUNTER — Encounter: Payer: Self-pay | Admitting: *Deleted

## 2021-12-13 ENCOUNTER — Other Ambulatory Visit: Payer: Self-pay

## 2021-12-13 ENCOUNTER — Emergency Department: Payer: Medicaid Other

## 2021-12-13 DIAGNOSIS — M1712 Unilateral primary osteoarthritis, left knee: Secondary | ICD-10-CM | POA: Insufficient documentation

## 2021-12-13 DIAGNOSIS — Z043 Encounter for examination and observation following other accident: Secondary | ICD-10-CM | POA: Diagnosis not present

## 2021-12-13 DIAGNOSIS — W010XXA Fall on same level from slipping, tripping and stumbling without subsequent striking against object, initial encounter: Secondary | ICD-10-CM | POA: Diagnosis not present

## 2021-12-13 DIAGNOSIS — S8002XA Contusion of left knee, initial encounter: Secondary | ICD-10-CM | POA: Insufficient documentation

## 2021-12-13 DIAGNOSIS — Z7982 Long term (current) use of aspirin: Secondary | ICD-10-CM | POA: Diagnosis not present

## 2021-12-13 DIAGNOSIS — M25462 Effusion, left knee: Secondary | ICD-10-CM | POA: Diagnosis not present

## 2021-12-13 DIAGNOSIS — Y990 Civilian activity done for income or pay: Secondary | ICD-10-CM | POA: Insufficient documentation

## 2021-12-13 DIAGNOSIS — S8992XA Unspecified injury of left lower leg, initial encounter: Secondary | ICD-10-CM | POA: Diagnosis present

## 2021-12-13 DIAGNOSIS — M170 Bilateral primary osteoarthritis of knee: Secondary | ICD-10-CM | POA: Diagnosis not present

## 2021-12-13 DIAGNOSIS — F411 Generalized anxiety disorder: Secondary | ICD-10-CM | POA: Diagnosis not present

## 2021-12-13 MED ORDER — HYDROCODONE-ACETAMINOPHEN 5-325 MG PO TABS: 1.0000 | ORAL_TABLET | ORAL | 0 refills | Status: DC | PRN

## 2021-12-13 MED ORDER — HYDROCODONE-ACETAMINOPHEN 5-325 MG PO TABS
1.0000 | ORAL_TABLET | ORAL | Status: AC
  Administered 2021-12-13: 1 via ORAL
  Filled 2021-12-13: qty 1

## 2021-12-13 MED ORDER — PREDNISONE 10 MG PO TABS: 10.0000 mg | ORAL_TABLET | Freq: Every day | ORAL | 0 refills | Status: DC

## 2021-12-13 MED ORDER — PREDNISONE 20 MG PO TABS
60.0000 mg | ORAL_TABLET | Freq: Once | ORAL | Status: AC
  Administered 2021-12-13: 60 mg via ORAL
  Filled 2021-12-13: qty 3

## 2021-12-13 NOTE — ED Provider Notes (Signed)
[facility] EMERGENCY DEPARTMENT Provider Note   CSN: [id number] Arrival date & time: 12/13/21  2031     History  Chief Complaint  Patient presents with   Knee Injury    Diane Castillo is a 51 y.o. female.  Presents to the emergency department for evaluation of left knee pain.  Patient has had left knee pain since yesterday when she fell at work.  She slipped on the wet floor in the bathroom, fell and landed on her left knee.  She has had pain and swelling throughout the knee.  She is able to walk but has some limping.  She has not any medications for pain.  She has a history of left knee osteoarthritis, underwent right total knee arthroplasty 5 years ago.  Right knee is doing well.  She denies any head injury LOC nausea or vomiting.  She denies any other pain throughout her body.  HPI     Home Medications Prior to Admission medications   Medication Sig Start Date End Date Taking? Authorizing Provider  HYDROcodone-acetaminophen (NORCO) 5-325 MG tablet Take 1 tablet by mouth every 4 (four) hours as needed for moderate pain. 12/13/21  Yes Evon Slack, PA-C  predniSONE (DELTASONE) 10 MG tablet Take 1 tablet (10 mg total) by mouth daily. 6,5,4,3,2,1 six day taper 12/13/21  Yes Evon Slack, PA-C  acetaminophen (TYLENOL) 500 MG tablet Take 1,000 mg by mouth daily as needed for moderate pain or headache.    [provider]  albuterol (VENTOLIN HFA) 108 (90 Base) MCG/ACT inhaler Inhale 2 puffs into the lungs every 6 (six) hours as needed for wheezing or shortness of breath.  10/03/15   [provider]  aspirin EC 81 MG tablet Take 81 mg by mouth daily.     [provider]  Aspirin-Salicylamide-Caffeine (ARTHRITIS STRENGTH BC POWDER PO) Take 1 packet by mouth daily as needed (pain).    [provider]  azithromycin (ZITHROMAX) 250 MG tablet Take by mouth daily.    [provider]  bictegravir-emtricitabine-tenofovir AF  (BIKTARVY) 50-200-25 MG TABS tablet Take 1 tablet by mouth daily. 12/07/19   [provider]  diazepam (VALIUM) 5 MG tablet Take 5 mg by mouth every 8 (eight) hours as needed. Patient not taking: Reported on 11/22/2020 02/28/20   [provider]  diphenhydrAMINE (BENADRYL) 25 MG tablet Take 25 mg by mouth daily as needed for allergies.    [provider]  docusate sodium (COLACE) 100 MG capsule Take 1 capsule (100 mg total) by mouth 2 (two) times daily. Patient taking differently: Take 100 mg by mouth daily.  11/14/15   Katharina Caper, MD  dolutegravir (TIVICAY) 50 MG tablet Take 50 mg by mouth daily. 05/16/15   [provider]  emtricitabine-tenofovir AF (DESCOVY) 200-25 MG tablet Take 1 tablet by mouth daily.    [provider]  enalapril (VASOTEC) 10 MG tablet Take 10 mg by mouth daily.    [provider]  esomeprazole (NEXIUM) 40 MG capsule Take 40 mg by mouth 2 (two) times daily. 01/06/20   [provider]  gabapentin (NEURONTIN) 300 MG capsule Take 1 capsule by mouth daily. Patient not taking: Reported on 11/22/2020 01/15/19   [provider]  ketorolac (TORADOL) 10 MG tablet Take 1 tablet (10 mg total) by mouth every 8 (eight) hours as needed for moderate pain. 06/01/17   Tommi Rumps, PA-C  Lidocaine 0.5 % GEL Apply 1 application topically 3 (three) times  daily. 09/28/17   Enid Derry, PA-C  meloxicam (MOBIC) 15 MG tablet Take 1 tablet (15 mg total) by mouth daily. 12/26/20 12/26/21  Orvil Feil, PA-C  Menthol, Topical Analgesic, (ICY HOT EX) Apply 1 application topically daily as needed (pain).    [provider]  metroNIDAZOLE (FLAGYL) 500 MG tablet Take 1 tablet (500 mg total) by mouth 2 (two) times daily. Patient not taking: Reported on 11/22/2020 03/28/20   Matt Holmes, PA  oxyCODONE-acetaminophen (PERCOCET/ROXICET) 5-325 MG tablet Take 1 tablet by mouth every 6 (six) hours as needed for severe pain.  07/13/17   Joni Reining, PA-C  QVAR REDIHALER 40 MCG/ACT inhaler SMARTSIG:2 Puff(s) By Mouth Twice Daily 02/04/20   [provider]  sertraline (ZOLOFT) 50 MG tablet Take 1/2 pill by mouth daily for 1 week then increase to 1 pill daily Patient not taking: Reported on 11/22/2020 01/27/20   [provider]  valACYclovir (VALTREX) 500 MG tablet Take 500 mg by mouth 2 (two) times daily. 10/03/15   [provider]      Allergies    Penicillins and Tramadol    Review of Systems   Review of Systems  Physical Exam Updated Vital Signs BP 105/72 (BP Location: Left Arm)   Pulse 74   Temp 98.3 F (36.8 C) (Oral)   Resp 18   Ht 5\' 5"  (1.651 m)   Wt 101.6 kg   LMP 11/23/2014   SpO2 95%   BMI 37.28 kg/m  Physical Exam Constitutional:      Appearance: She is well-developed.  HENT:     Head: Normocephalic and atraumatic.  Eyes:     Conjunctiva/sclera: Conjunctivae normal.  Cardiovascular:     Rate and Rhythm: Normal rate.  Pulmonary:     Effort: Pulmonary effort is normal. No respiratory distress.     Breath sounds: Normal breath sounds.  Musculoskeletal:        General: Normal range of motion.     Cervical back: Normal range of motion.     Comments: Left knee with minimal swelling no warmth erythema or effusion.  She is able to actively straight leg raise, patella tracks well.  No defect in quad tendon or patellar tendon.  No laxity with valgus or varus stress testing.  No swelling warmth erythema or edema throughout the left leg.  Hip moves well with internal ex rotation with no discomfort.  Skin:    General: Skin is warm.     Findings: No rash.  Neurological:     Mental Status: She is alert and oriented to person, place, and time.  Psychiatric:        Behavior: Behavior normal.        Thought Content: Thought content normal.     ED Results / Procedures / Treatments   Labs (all labs ordered are listed, but only abnormal results are displayed) Labs  Reviewed - No data to display  EKG None  Radiology DG Knee Complete 4 Views Left  Result Date: 12/13/2021 CLINICAL DATA:  12/15/2021 at work EXAM: LEFT KNEE - COMPLETE 4+ VIEW COMPARISON:  None Available. FINDINGS: No fracture or malalignment. Moderate tricompartment arthritis of the knee. Positive for knee effusion. IMPRESSION: 1. No acute osseous abnormality 2. Moderate tricompartment arthritis of the knee with moderate knee effusion Electronically Signed   By: Larey Seat M.D.   On: 12/13/2021 21:05    Procedures Procedures    Medications Ordered in ED Medications  predniSONE (DELTASONE) tablet 60  mg (has no administration in time range)  HYDROcodone-acetaminophen (NORCO/VICODIN) 5-325 MG per tablet 1 tablet (has no administration in time range)    ED Course/ Medical Decision Making/ A&P                           Medical Decision Making Risk Prescription drug management.   51 year old female with injury to her left knee at work 2 days ago.  Patient slipped on wet floor landed on her left knee, developed some pain and swelling throughout the knee.  She has a history of left knee osteoarthritis.  No ligamentous injury, x-rays independently ordered and reviewed by me today show no evidence of acute bony abnormality or effusion.  Patient will follow-up with orthopedics.  She is given a note remain out of work for 2 days.  She is given Norco for severe pain as well as crutches to help with ambulation.  She will take a 6-day steroid taper. Final Clinical Impression(s) / ED Diagnoses Final diagnoses:  Primary osteoarthritis of left knee  Contusion of left knee, initial encounter    Rx / DC Orders ED Discharge Orders          Ordered    HYDROcodone-acetaminophen (NORCO) 5-325 MG tablet  Every 4 hours PRN        12/13/21 2200    predniSONE (DELTASONE) 10 MG tablet  Daily        12/13/21 2200              Ronnette Juniper 12/13/21 2205    Shaune Pollack,  MD 12/16/21 [phone]

## 2021-12-13 NOTE — ED Triage Notes (Signed)
Pt fell at work yesterday and has left knee pain.  Pt has swelling to knee.  Pt states WC.  Pt alert.

## 2021-12-13 NOTE — Discharge Instructions (Addendum)
Please rest ice and elevate the left knee.  Use crutches as needed for ambulation.  Take Norco as needed for severe pain.  Take prednisone as prescribed for 6 days.  Follow-up with Dr. Odis Luster in 1 week if no improvement

## 2021-12-14 DIAGNOSIS — M170 Bilateral primary osteoarthritis of knee: Secondary | ICD-10-CM | POA: Diagnosis not present

## 2021-12-14 DIAGNOSIS — F411 Generalized anxiety disorder: Secondary | ICD-10-CM | POA: Diagnosis not present

## 2021-12-15 DIAGNOSIS — F411 Generalized anxiety disorder: Secondary | ICD-10-CM | POA: Diagnosis not present

## 2021-12-15 DIAGNOSIS — M170 Bilateral primary osteoarthritis of knee: Secondary | ICD-10-CM | POA: Diagnosis not present

## 2021-12-16 DIAGNOSIS — M170 Bilateral primary osteoarthritis of knee: Secondary | ICD-10-CM | POA: Diagnosis not present

## 2021-12-17 DIAGNOSIS — F411 Generalized anxiety disorder: Secondary | ICD-10-CM | POA: Diagnosis not present

## 2021-12-17 DIAGNOSIS — M170 Bilateral primary osteoarthritis of knee: Secondary | ICD-10-CM | POA: Diagnosis not present

## 2021-12-18 DIAGNOSIS — M170 Bilateral primary osteoarthritis of knee: Secondary | ICD-10-CM | POA: Diagnosis not present

## 2021-12-18 DIAGNOSIS — F411 Generalized anxiety disorder: Secondary | ICD-10-CM | POA: Diagnosis not present

## 2021-12-19 DIAGNOSIS — M170 Bilateral primary osteoarthritis of knee: Secondary | ICD-10-CM | POA: Diagnosis not present

## 2021-12-20 DIAGNOSIS — M170 Bilateral primary osteoarthritis of knee: Secondary | ICD-10-CM | POA: Diagnosis not present

## 2021-12-20 DIAGNOSIS — F411 Generalized anxiety disorder: Secondary | ICD-10-CM | POA: Diagnosis not present

## 2021-12-21 DIAGNOSIS — M170 Bilateral primary osteoarthritis of knee: Secondary | ICD-10-CM | POA: Diagnosis not present

## 2021-12-21 DIAGNOSIS — F411 Generalized anxiety disorder: Secondary | ICD-10-CM | POA: Diagnosis not present

## 2021-12-22 DIAGNOSIS — F411 Generalized anxiety disorder: Secondary | ICD-10-CM | POA: Diagnosis not present

## 2021-12-22 DIAGNOSIS — M170 Bilateral primary osteoarthritis of knee: Secondary | ICD-10-CM | POA: Diagnosis not present

## 2021-12-23 DIAGNOSIS — M170 Bilateral primary osteoarthritis of knee: Secondary | ICD-10-CM | POA: Diagnosis not present

## 2021-12-23 DIAGNOSIS — F411 Generalized anxiety disorder: Secondary | ICD-10-CM | POA: Diagnosis not present

## 2021-12-24 DIAGNOSIS — M170 Bilateral primary osteoarthritis of knee: Secondary | ICD-10-CM | POA: Diagnosis not present

## 2021-12-25 DIAGNOSIS — M170 Bilateral primary osteoarthritis of knee: Secondary | ICD-10-CM | POA: Diagnosis not present

## 2021-12-25 DIAGNOSIS — F411 Generalized anxiety disorder: Secondary | ICD-10-CM | POA: Diagnosis not present

## 2021-12-26 DIAGNOSIS — M170 Bilateral primary osteoarthritis of knee: Secondary | ICD-10-CM | POA: Diagnosis not present

## 2021-12-26 DIAGNOSIS — F411 Generalized anxiety disorder: Secondary | ICD-10-CM | POA: Diagnosis not present

## 2021-12-27 DIAGNOSIS — F411 Generalized anxiety disorder: Secondary | ICD-10-CM | POA: Diagnosis not present

## 2021-12-28 DIAGNOSIS — F411 Generalized anxiety disorder: Secondary | ICD-10-CM | POA: Diagnosis not present

## 2021-12-28 DIAGNOSIS — M170 Bilateral primary osteoarthritis of knee: Secondary | ICD-10-CM | POA: Diagnosis not present

## 2021-12-29 DIAGNOSIS — M170 Bilateral primary osteoarthritis of knee: Secondary | ICD-10-CM | POA: Diagnosis not present

## 2021-12-29 DIAGNOSIS — F411 Generalized anxiety disorder: Secondary | ICD-10-CM | POA: Diagnosis not present

## 2021-12-31 DIAGNOSIS — M170 Bilateral primary osteoarthritis of knee: Secondary | ICD-10-CM | POA: Diagnosis not present

## 2022-01-01 DIAGNOSIS — M170 Bilateral primary osteoarthritis of knee: Secondary | ICD-10-CM | POA: Diagnosis not present

## 2022-01-01 DIAGNOSIS — F411 Generalized anxiety disorder: Secondary | ICD-10-CM | POA: Diagnosis not present

## 2022-01-02 DIAGNOSIS — F411 Generalized anxiety disorder: Secondary | ICD-10-CM | POA: Diagnosis not present

## 2022-01-02 DIAGNOSIS — M170 Bilateral primary osteoarthritis of knee: Secondary | ICD-10-CM | POA: Diagnosis not present

## 2022-01-03 DIAGNOSIS — F411 Generalized anxiety disorder: Secondary | ICD-10-CM | POA: Diagnosis not present

## 2022-01-03 DIAGNOSIS — M170 Bilateral primary osteoarthritis of knee: Secondary | ICD-10-CM | POA: Diagnosis not present

## 2022-01-04 DIAGNOSIS — F411 Generalized anxiety disorder: Secondary | ICD-10-CM | POA: Diagnosis not present

## 2022-01-04 DIAGNOSIS — M170 Bilateral primary osteoarthritis of knee: Secondary | ICD-10-CM | POA: Diagnosis not present

## 2022-01-05 DIAGNOSIS — M170 Bilateral primary osteoarthritis of knee: Secondary | ICD-10-CM | POA: Diagnosis not present

## 2022-01-05 DIAGNOSIS — F411 Generalized anxiety disorder: Secondary | ICD-10-CM | POA: Diagnosis not present

## 2022-01-06 DIAGNOSIS — F411 Generalized anxiety disorder: Secondary | ICD-10-CM | POA: Diagnosis not present

## 2022-01-06 DIAGNOSIS — M170 Bilateral primary osteoarthritis of knee: Secondary | ICD-10-CM | POA: Diagnosis not present

## 2022-01-07 ENCOUNTER — Encounter: Payer: Self-pay | Admitting: Emergency Medicine

## 2022-01-07 ENCOUNTER — Emergency Department: Payer: Medicaid Other

## 2022-01-07 ENCOUNTER — Other Ambulatory Visit: Payer: Self-pay

## 2022-01-07 ENCOUNTER — Emergency Department
Admission: EM | Admit: 2022-01-07 | Discharge: 2022-01-07 | Disposition: A | Discharge status: Home / Self Care | Payer: Medicaid Other | Attending: Emergency Medicine | Admitting: Emergency Medicine

## 2022-01-07 DIAGNOSIS — M25552 Pain in left hip: Secondary | ICD-10-CM

## 2022-01-07 DIAGNOSIS — J45909 Unspecified asthma, uncomplicated: Secondary | ICD-10-CM | POA: Diagnosis not present

## 2022-01-07 DIAGNOSIS — M1612 Unilateral primary osteoarthritis, left hip: Secondary | ICD-10-CM | POA: Insufficient documentation

## 2022-01-07 DIAGNOSIS — I10 Essential (primary) hypertension: Secondary | ICD-10-CM | POA: Diagnosis not present

## 2022-01-07 DIAGNOSIS — Z86718 Personal history of other venous thrombosis and embolism: Secondary | ICD-10-CM | POA: Diagnosis not present

## 2022-01-07 MED ORDER — MELOXICAM 15 MG PO TABS: 15.0000 mg | ORAL_TABLET | Freq: Every day | ORAL | 0 refills | Status: AC

## 2022-01-07 NOTE — ED Triage Notes (Signed)
Pt via POV from home. Pt c/o L hip pain that goes all the way down her L leg. Pt fell on 6/28 and was seen and discharged. Pt is A&Ox4 and NAD.

## 2022-01-07 NOTE — ED Notes (Signed)
See triage note  Presents with cont'd pain to left hip which radiates into leg  States she had a fall in June  denies any new injury

## 2022-01-07 NOTE — ED Provider Notes (Signed)
[facility] Provider Note    Event Date/Time   First MD Initiated Contact with Patient 01/07/22 [id number]     (approximate)   History   Fall   HPI  Diane Castillo is a 51 y.o. female   presents to the ED with complaint of left hip pain.  Patient states that she fell on 12/13/2021 and was seen in the emergency department however records indicate that at that time her knee was x-rayed as that was her main complaint.  Patient has not taken any over-the-counter medication for this.  She continues to ambulate without any assistance.  Patient has a history of asthma, DVT, hypertension, HIV positive and polysubstance abuse.      Physical Exam   Triage Vital Signs: ED Triage Vitals  Enc Vitals Group     BP 01/07/22 0857 (!) 141/95     Pulse Rate 01/07/22 0857 66     Resp 01/07/22 0857 20     Temp 01/07/22 0857 97.9 F (36.6 C)     Temp Source 01/07/22 0857 Oral     SpO2 01/07/22 0857 98 %     Weight 01/07/22 0847 222 lb (100.7 kg)     Height 01/07/22 0847 5\' 5"  (1.651 m)     Head Circumference --      Peak Flow --      Pain Score 01/07/22 0847 9     Pain Loc --      Pain Edu? --      Excl. in [place]? --     Most recent vital signs: Vitals:   01/07/22 0857  BP: (!) 141/95  Pulse: 66  Resp: 20  Temp: 97.9 F (36.6 C)  SpO2: 98%     General: Awake, no distress.  CV:  Good peripheral perfusion.  Resp:  Normal effort.  Abd:  No distention.  Other:  Left hip with no gross deformity and patient is able to stand and ambulate without any assistance.  Range of motion without restriction.  No erythema or warmth noted.  No skin discoloration present.  No shortening or rotation of the lower extremity.   ED Results / Procedures / Treatments   Labs (all labs ordered are listed, but only abnormal results are displayed) Labs Reviewed - No data to display   RADIOLOGY Left hip x-ray images were reviewed by myself independent of the radiologist and was  negative for acute fracture.  Radiology report shows mild degenerative changes.    PROCEDURES:  Critical Care performed:   Procedures   MEDICATIONS ORDERED IN ED: Medications - No data to display   IMPRESSION / MDM / ASSESSMENT AND PLAN / ED COURSE  I reviewed the triage vital signs and the nursing notes.   Differential diagnosis includes, but is not limited to, left hip fracture, contusion left hip, osteoarthritis left hip.  51 year old female presents to the ED with complaint of left hip pain since her fall on 12/13/2021.  Patient was seen for that injury however her only complaint at that time was her knee which was negative on x-ray.  Today she complains of left hip pain with ambulation but continues to ambulate without any assistance and no change in her gait was noted.  X-rays were negative for acute fracture or dislocation and degenerative changes were noted.  Patient was made aware.  Meloxicam was sent to her pharmacy to take 1 daily with food.  She is to follow-up with her PCP at [facility] if any  continued problems or if the medication is making a huge difference and she wants to remain on it.      Patient's presentation is most consistent with acute complicated illness / injury requiring diagnostic workup.  FINAL CLINICAL IMPRESSION(S) / ED DIAGNOSES   Final diagnoses:  Left hip pain  Osteoarthritis of left hip, unspecified osteoarthritis type     Rx / DC Orders   ED Discharge Orders          Ordered    meloxicam (MOBIC) 15 MG tablet  Daily        01/07/22 1145             Note:  This document was prepared using Dragon voice recognition software and may include unintentional dictation errors.   Tommi Rumps, PA-C 01/07/22 1153    Concha Se, MD 01/07/22 [phone]

## 2022-01-07 NOTE — Discharge Instructions (Signed)
Follow-up with your primary care provider if any continued problems.  A prescription for meloxicam was sent to your pharmacy to begin taking once a day with food every day for your left hip pain.  You may also use ice or heat to your hip as needed for discomfort.

## 2022-01-08 DIAGNOSIS — M170 Bilateral primary osteoarthritis of knee: Secondary | ICD-10-CM | POA: Diagnosis not present

## 2022-01-09 DIAGNOSIS — F411 Generalized anxiety disorder: Secondary | ICD-10-CM | POA: Diagnosis not present

## 2022-01-09 DIAGNOSIS — M170 Bilateral primary osteoarthritis of knee: Secondary | ICD-10-CM | POA: Diagnosis not present

## 2022-01-10 DIAGNOSIS — M170 Bilateral primary osteoarthritis of knee: Secondary | ICD-10-CM | POA: Diagnosis not present

## 2022-01-10 DIAGNOSIS — F411 Generalized anxiety disorder: Secondary | ICD-10-CM | POA: Diagnosis not present

## 2022-01-11 DIAGNOSIS — F411 Generalized anxiety disorder: Secondary | ICD-10-CM | POA: Diagnosis not present

## 2022-01-11 DIAGNOSIS — M170 Bilateral primary osteoarthritis of knee: Secondary | ICD-10-CM | POA: Diagnosis not present

## 2022-01-12 DIAGNOSIS — F411 Generalized anxiety disorder: Secondary | ICD-10-CM | POA: Diagnosis not present

## 2022-01-12 DIAGNOSIS — M170 Bilateral primary osteoarthritis of knee: Secondary | ICD-10-CM | POA: Diagnosis not present

## 2022-01-15 DIAGNOSIS — F411 Generalized anxiety disorder: Secondary | ICD-10-CM | POA: Diagnosis not present

## 2022-01-15 DIAGNOSIS — M170 Bilateral primary osteoarthritis of knee: Secondary | ICD-10-CM | POA: Diagnosis not present

## 2022-01-16 DIAGNOSIS — M170 Bilateral primary osteoarthritis of knee: Secondary | ICD-10-CM | POA: Diagnosis not present

## 2022-01-16 DIAGNOSIS — F411 Generalized anxiety disorder: Secondary | ICD-10-CM | POA: Diagnosis not present

## 2022-01-17 DIAGNOSIS — F411 Generalized anxiety disorder: Secondary | ICD-10-CM | POA: Diagnosis not present

## 2022-01-17 DIAGNOSIS — M170 Bilateral primary osteoarthritis of knee: Secondary | ICD-10-CM | POA: Diagnosis not present

## 2022-01-18 DIAGNOSIS — F411 Generalized anxiety disorder: Secondary | ICD-10-CM | POA: Diagnosis not present

## 2022-01-18 DIAGNOSIS — M170 Bilateral primary osteoarthritis of knee: Secondary | ICD-10-CM | POA: Diagnosis not present

## 2022-01-19 DIAGNOSIS — F411 Generalized anxiety disorder: Secondary | ICD-10-CM | POA: Diagnosis not present

## 2022-01-20 DIAGNOSIS — M170 Bilateral primary osteoarthritis of knee: Secondary | ICD-10-CM | POA: Diagnosis not present

## 2022-01-20 DIAGNOSIS — F411 Generalized anxiety disorder: Secondary | ICD-10-CM | POA: Diagnosis not present

## 2022-01-21 DIAGNOSIS — M170 Bilateral primary osteoarthritis of knee: Secondary | ICD-10-CM | POA: Diagnosis not present

## 2022-01-22 DIAGNOSIS — M170 Bilateral primary osteoarthritis of knee: Secondary | ICD-10-CM | POA: Diagnosis not present

## 2022-01-22 DIAGNOSIS — F411 Generalized anxiety disorder: Secondary | ICD-10-CM | POA: Diagnosis not present

## 2022-01-23 DIAGNOSIS — F411 Generalized anxiety disorder: Secondary | ICD-10-CM | POA: Diagnosis not present

## 2022-01-23 DIAGNOSIS — M170 Bilateral primary osteoarthritis of knee: Secondary | ICD-10-CM | POA: Diagnosis not present

## 2022-01-24 DIAGNOSIS — M170 Bilateral primary osteoarthritis of knee: Secondary | ICD-10-CM | POA: Diagnosis not present

## 2022-01-24 DIAGNOSIS — F411 Generalized anxiety disorder: Secondary | ICD-10-CM | POA: Diagnosis not present

## 2022-01-25 DIAGNOSIS — F411 Generalized anxiety disorder: Secondary | ICD-10-CM | POA: Diagnosis not present

## 2022-01-25 DIAGNOSIS — M170 Bilateral primary osteoarthritis of knee: Secondary | ICD-10-CM | POA: Diagnosis not present

## 2022-01-26 DIAGNOSIS — M170 Bilateral primary osteoarthritis of knee: Secondary | ICD-10-CM | POA: Diagnosis not present

## 2022-01-26 DIAGNOSIS — F411 Generalized anxiety disorder: Secondary | ICD-10-CM | POA: Diagnosis not present

## 2022-01-27 DIAGNOSIS — M170 Bilateral primary osteoarthritis of knee: Secondary | ICD-10-CM | POA: Diagnosis not present

## 2022-01-27 DIAGNOSIS — F411 Generalized anxiety disorder: Secondary | ICD-10-CM | POA: Diagnosis not present

## 2022-01-29 DIAGNOSIS — F411 Generalized anxiety disorder: Secondary | ICD-10-CM | POA: Diagnosis not present

## 2022-01-29 DIAGNOSIS — M170 Bilateral primary osteoarthritis of knee: Secondary | ICD-10-CM | POA: Diagnosis not present

## 2022-01-30 ENCOUNTER — Other Ambulatory Visit: Payer: Self-pay | Admitting: Orthopedic Surgery

## 2022-01-30 DIAGNOSIS — M75102 Unspecified rotator cuff tear or rupture of left shoulder, not specified as traumatic: Secondary | ICD-10-CM

## 2022-01-30 DIAGNOSIS — W19XXXA Unspecified fall, initial encounter: Secondary | ICD-10-CM

## 2022-01-30 DIAGNOSIS — M25562 Pain in left knee: Secondary | ICD-10-CM

## 2022-01-30 DIAGNOSIS — M170 Bilateral primary osteoarthritis of knee: Secondary | ICD-10-CM | POA: Diagnosis not present

## 2022-01-31 DIAGNOSIS — M170 Bilateral primary osteoarthritis of knee: Secondary | ICD-10-CM | POA: Diagnosis not present

## 2022-02-01 DIAGNOSIS — M170 Bilateral primary osteoarthritis of knee: Secondary | ICD-10-CM | POA: Diagnosis not present

## 2022-02-02 DIAGNOSIS — M170 Bilateral primary osteoarthritis of knee: Secondary | ICD-10-CM | POA: Diagnosis not present

## 2022-02-03 DIAGNOSIS — M170 Bilateral primary osteoarthritis of knee: Secondary | ICD-10-CM | POA: Diagnosis not present

## 2022-02-04 DIAGNOSIS — M170 Bilateral primary osteoarthritis of knee: Secondary | ICD-10-CM | POA: Diagnosis not present

## 2022-02-05 DIAGNOSIS — M170 Bilateral primary osteoarthritis of knee: Secondary | ICD-10-CM | POA: Diagnosis not present

## 2022-02-06 DIAGNOSIS — F411 Generalized anxiety disorder: Secondary | ICD-10-CM | POA: Diagnosis not present

## 2022-02-06 DIAGNOSIS — M170 Bilateral primary osteoarthritis of knee: Secondary | ICD-10-CM | POA: Diagnosis not present

## 2022-02-07 DIAGNOSIS — M170 Bilateral primary osteoarthritis of knee: Secondary | ICD-10-CM | POA: Diagnosis not present

## 2022-02-07 DIAGNOSIS — F411 Generalized anxiety disorder: Secondary | ICD-10-CM | POA: Diagnosis not present

## 2022-02-08 DIAGNOSIS — F411 Generalized anxiety disorder: Secondary | ICD-10-CM | POA: Diagnosis not present

## 2022-02-08 DIAGNOSIS — M170 Bilateral primary osteoarthritis of knee: Secondary | ICD-10-CM | POA: Diagnosis not present

## 2022-02-09 DIAGNOSIS — F411 Generalized anxiety disorder: Secondary | ICD-10-CM | POA: Diagnosis not present

## 2022-02-09 DIAGNOSIS — M170 Bilateral primary osteoarthritis of knee: Secondary | ICD-10-CM | POA: Diagnosis not present

## 2022-02-10 DIAGNOSIS — F411 Generalized anxiety disorder: Secondary | ICD-10-CM | POA: Diagnosis not present

## 2022-02-11 DIAGNOSIS — M170 Bilateral primary osteoarthritis of knee: Secondary | ICD-10-CM | POA: Diagnosis not present

## 2022-02-12 DIAGNOSIS — M170 Bilateral primary osteoarthritis of knee: Secondary | ICD-10-CM | POA: Diagnosis not present

## 2022-02-12 DIAGNOSIS — F411 Generalized anxiety disorder: Secondary | ICD-10-CM | POA: Diagnosis not present

## 2022-02-13 DIAGNOSIS — F411 Generalized anxiety disorder: Secondary | ICD-10-CM | POA: Diagnosis not present

## 2022-02-13 DIAGNOSIS — M170 Bilateral primary osteoarthritis of knee: Secondary | ICD-10-CM | POA: Diagnosis not present

## 2022-02-14 DIAGNOSIS — F411 Generalized anxiety disorder: Secondary | ICD-10-CM | POA: Diagnosis not present

## 2022-02-14 DIAGNOSIS — M170 Bilateral primary osteoarthritis of knee: Secondary | ICD-10-CM | POA: Diagnosis not present

## 2022-02-15 DIAGNOSIS — F411 Generalized anxiety disorder: Secondary | ICD-10-CM | POA: Diagnosis not present

## 2022-02-15 DIAGNOSIS — M170 Bilateral primary osteoarthritis of knee: Secondary | ICD-10-CM | POA: Diagnosis not present

## 2022-02-16 DIAGNOSIS — M170 Bilateral primary osteoarthritis of knee: Secondary | ICD-10-CM | POA: Diagnosis not present

## 2022-02-16 DIAGNOSIS — F411 Generalized anxiety disorder: Secondary | ICD-10-CM | POA: Diagnosis not present

## 2022-02-17 DIAGNOSIS — F411 Generalized anxiety disorder: Secondary | ICD-10-CM | POA: Diagnosis not present

## 2022-02-17 DIAGNOSIS — M170 Bilateral primary osteoarthritis of knee: Secondary | ICD-10-CM | POA: Diagnosis not present

## 2022-02-18 DIAGNOSIS — M170 Bilateral primary osteoarthritis of knee: Secondary | ICD-10-CM | POA: Diagnosis not present

## 2022-02-19 DIAGNOSIS — F411 Generalized anxiety disorder: Secondary | ICD-10-CM | POA: Diagnosis not present

## 2022-02-19 DIAGNOSIS — M170 Bilateral primary osteoarthritis of knee: Secondary | ICD-10-CM | POA: Diagnosis not present

## 2022-02-20 DIAGNOSIS — M170 Bilateral primary osteoarthritis of knee: Secondary | ICD-10-CM | POA: Diagnosis not present

## 2022-02-20 DIAGNOSIS — F411 Generalized anxiety disorder: Secondary | ICD-10-CM | POA: Diagnosis not present

## 2022-02-21 DIAGNOSIS — M170 Bilateral primary osteoarthritis of knee: Secondary | ICD-10-CM | POA: Diagnosis not present

## 2022-02-21 DIAGNOSIS — F411 Generalized anxiety disorder: Secondary | ICD-10-CM | POA: Diagnosis not present

## 2022-02-22 DIAGNOSIS — F411 Generalized anxiety disorder: Secondary | ICD-10-CM | POA: Diagnosis not present

## 2022-02-22 DIAGNOSIS — M170 Bilateral primary osteoarthritis of knee: Secondary | ICD-10-CM | POA: Diagnosis not present

## 2022-02-23 DIAGNOSIS — F411 Generalized anxiety disorder: Secondary | ICD-10-CM | POA: Diagnosis not present

## 2022-02-24 DIAGNOSIS — M170 Bilateral primary osteoarthritis of knee: Secondary | ICD-10-CM | POA: Diagnosis not present

## 2022-02-24 DIAGNOSIS — F411 Generalized anxiety disorder: Secondary | ICD-10-CM | POA: Diagnosis not present

## 2022-02-25 DIAGNOSIS — M170 Bilateral primary osteoarthritis of knee: Secondary | ICD-10-CM | POA: Diagnosis not present

## 2022-02-26 DIAGNOSIS — F411 Generalized anxiety disorder: Secondary | ICD-10-CM | POA: Diagnosis not present

## 2022-02-26 DIAGNOSIS — M170 Bilateral primary osteoarthritis of knee: Secondary | ICD-10-CM | POA: Diagnosis not present

## 2022-02-27 DIAGNOSIS — F411 Generalized anxiety disorder: Secondary | ICD-10-CM | POA: Diagnosis not present

## 2022-02-27 DIAGNOSIS — M170 Bilateral primary osteoarthritis of knee: Secondary | ICD-10-CM | POA: Diagnosis not present

## 2022-02-28 DIAGNOSIS — M170 Bilateral primary osteoarthritis of knee: Secondary | ICD-10-CM | POA: Diagnosis not present

## 2022-02-28 DIAGNOSIS — F411 Generalized anxiety disorder: Secondary | ICD-10-CM | POA: Diagnosis not present

## 2022-03-01 DIAGNOSIS — M170 Bilateral primary osteoarthritis of knee: Secondary | ICD-10-CM | POA: Diagnosis not present

## 2022-03-01 DIAGNOSIS — F411 Generalized anxiety disorder: Secondary | ICD-10-CM | POA: Diagnosis not present

## 2022-03-02 DIAGNOSIS — F411 Generalized anxiety disorder: Secondary | ICD-10-CM | POA: Diagnosis not present

## 2022-03-03 DIAGNOSIS — F411 Generalized anxiety disorder: Secondary | ICD-10-CM | POA: Diagnosis not present

## 2022-03-03 DIAGNOSIS — M170 Bilateral primary osteoarthritis of knee: Secondary | ICD-10-CM | POA: Diagnosis not present

## 2022-03-04 DIAGNOSIS — M170 Bilateral primary osteoarthritis of knee: Secondary | ICD-10-CM | POA: Diagnosis not present

## 2022-03-05 DIAGNOSIS — M19072 Primary osteoarthritis, left ankle and foot: Secondary | ICD-10-CM | POA: Diagnosis not present

## 2022-03-05 DIAGNOSIS — M25572 Pain in left ankle and joints of left foot: Secondary | ICD-10-CM | POA: Diagnosis not present

## 2022-03-05 DIAGNOSIS — M7750 Other enthesopathy of unspecified foot: Secondary | ICD-10-CM | POA: Diagnosis not present

## 2022-03-05 DIAGNOSIS — F411 Generalized anxiety disorder: Secondary | ICD-10-CM | POA: Diagnosis not present

## 2022-03-05 DIAGNOSIS — M170 Bilateral primary osteoarthritis of knee: Secondary | ICD-10-CM | POA: Diagnosis not present

## 2022-03-06 DIAGNOSIS — F411 Generalized anxiety disorder: Secondary | ICD-10-CM | POA: Diagnosis not present

## 2022-03-06 DIAGNOSIS — M170 Bilateral primary osteoarthritis of knee: Secondary | ICD-10-CM | POA: Diagnosis not present

## 2022-03-07 DIAGNOSIS — F411 Generalized anxiety disorder: Secondary | ICD-10-CM | POA: Diagnosis not present

## 2022-03-07 DIAGNOSIS — M170 Bilateral primary osteoarthritis of knee: Secondary | ICD-10-CM | POA: Diagnosis not present

## 2022-03-08 DIAGNOSIS — F411 Generalized anxiety disorder: Secondary | ICD-10-CM | POA: Diagnosis not present

## 2022-03-08 DIAGNOSIS — M170 Bilateral primary osteoarthritis of knee: Secondary | ICD-10-CM | POA: Diagnosis not present

## 2022-03-09 DIAGNOSIS — F411 Generalized anxiety disorder: Secondary | ICD-10-CM | POA: Diagnosis not present

## 2022-03-09 DIAGNOSIS — M170 Bilateral primary osteoarthritis of knee: Secondary | ICD-10-CM | POA: Diagnosis not present

## 2022-03-10 DIAGNOSIS — F411 Generalized anxiety disorder: Secondary | ICD-10-CM | POA: Diagnosis not present

## 2022-03-11 DIAGNOSIS — M170 Bilateral primary osteoarthritis of knee: Secondary | ICD-10-CM | POA: Diagnosis not present

## 2022-03-12 DIAGNOSIS — F411 Generalized anxiety disorder: Secondary | ICD-10-CM | POA: Diagnosis not present

## 2022-03-12 DIAGNOSIS — M170 Bilateral primary osteoarthritis of knee: Secondary | ICD-10-CM | POA: Diagnosis not present

## 2022-03-13 DIAGNOSIS — M170 Bilateral primary osteoarthritis of knee: Secondary | ICD-10-CM | POA: Diagnosis not present

## 2022-03-13 DIAGNOSIS — F411 Generalized anxiety disorder: Secondary | ICD-10-CM | POA: Diagnosis not present

## 2022-03-14 DIAGNOSIS — M170 Bilateral primary osteoarthritis of knee: Secondary | ICD-10-CM | POA: Diagnosis not present

## 2022-03-14 DIAGNOSIS — F411 Generalized anxiety disorder: Secondary | ICD-10-CM | POA: Diagnosis not present

## 2022-03-15 DIAGNOSIS — M170 Bilateral primary osteoarthritis of knee: Secondary | ICD-10-CM | POA: Diagnosis not present

## 2022-03-15 DIAGNOSIS — F411 Generalized anxiety disorder: Secondary | ICD-10-CM | POA: Diagnosis not present

## 2022-03-16 DIAGNOSIS — M170 Bilateral primary osteoarthritis of knee: Secondary | ICD-10-CM | POA: Diagnosis not present

## 2022-03-16 DIAGNOSIS — F411 Generalized anxiety disorder: Secondary | ICD-10-CM | POA: Diagnosis not present

## 2022-03-17 DIAGNOSIS — M170 Bilateral primary osteoarthritis of knee: Secondary | ICD-10-CM | POA: Diagnosis not present

## 2022-03-17 DIAGNOSIS — F411 Generalized anxiety disorder: Secondary | ICD-10-CM | POA: Diagnosis not present

## 2022-03-18 DIAGNOSIS — M170 Bilateral primary osteoarthritis of knee: Secondary | ICD-10-CM | POA: Diagnosis not present

## 2022-03-19 DIAGNOSIS — M170 Bilateral primary osteoarthritis of knee: Secondary | ICD-10-CM | POA: Diagnosis not present

## 2022-03-19 DIAGNOSIS — F411 Generalized anxiety disorder: Secondary | ICD-10-CM | POA: Diagnosis not present

## 2022-03-20 DIAGNOSIS — M170 Bilateral primary osteoarthritis of knee: Secondary | ICD-10-CM | POA: Diagnosis not present

## 2022-03-20 DIAGNOSIS — F411 Generalized anxiety disorder: Secondary | ICD-10-CM | POA: Diagnosis not present

## 2022-03-21 DIAGNOSIS — F411 Generalized anxiety disorder: Secondary | ICD-10-CM | POA: Diagnosis not present

## 2022-03-21 DIAGNOSIS — M170 Bilateral primary osteoarthritis of knee: Secondary | ICD-10-CM | POA: Diagnosis not present

## 2022-03-22 DIAGNOSIS — M170 Bilateral primary osteoarthritis of knee: Secondary | ICD-10-CM | POA: Diagnosis not present

## 2022-03-22 DIAGNOSIS — F411 Generalized anxiety disorder: Secondary | ICD-10-CM | POA: Diagnosis not present

## 2022-03-23 DIAGNOSIS — F411 Generalized anxiety disorder: Secondary | ICD-10-CM | POA: Diagnosis not present

## 2022-03-23 DIAGNOSIS — M170 Bilateral primary osteoarthritis of knee: Secondary | ICD-10-CM | POA: Diagnosis not present

## 2022-03-24 DIAGNOSIS — M170 Bilateral primary osteoarthritis of knee: Secondary | ICD-10-CM | POA: Diagnosis not present

## 2022-03-24 DIAGNOSIS — F411 Generalized anxiety disorder: Secondary | ICD-10-CM | POA: Diagnosis not present

## 2022-03-25 DIAGNOSIS — M170 Bilateral primary osteoarthritis of knee: Secondary | ICD-10-CM | POA: Diagnosis not present

## 2022-03-26 DIAGNOSIS — M170 Bilateral primary osteoarthritis of knee: Secondary | ICD-10-CM | POA: Diagnosis not present

## 2022-03-26 DIAGNOSIS — F411 Generalized anxiety disorder: Secondary | ICD-10-CM | POA: Diagnosis not present

## 2022-03-27 DIAGNOSIS — F411 Generalized anxiety disorder: Secondary | ICD-10-CM | POA: Diagnosis not present

## 2022-03-27 DIAGNOSIS — M170 Bilateral primary osteoarthritis of knee: Secondary | ICD-10-CM | POA: Diagnosis not present

## 2022-03-28 DIAGNOSIS — M170 Bilateral primary osteoarthritis of knee: Secondary | ICD-10-CM | POA: Diagnosis not present

## 2022-03-28 DIAGNOSIS — F411 Generalized anxiety disorder: Secondary | ICD-10-CM | POA: Diagnosis not present

## 2022-03-29 DIAGNOSIS — M170 Bilateral primary osteoarthritis of knee: Secondary | ICD-10-CM | POA: Diagnosis not present

## 2022-03-29 DIAGNOSIS — F411 Generalized anxiety disorder: Secondary | ICD-10-CM | POA: Diagnosis not present

## 2022-03-30 DIAGNOSIS — M170 Bilateral primary osteoarthritis of knee: Secondary | ICD-10-CM | POA: Diagnosis not present

## 2022-03-30 DIAGNOSIS — F411 Generalized anxiety disorder: Secondary | ICD-10-CM | POA: Diagnosis not present

## 2022-03-31 DIAGNOSIS — F411 Generalized anxiety disorder: Secondary | ICD-10-CM | POA: Diagnosis not present

## 2022-03-31 DIAGNOSIS — M170 Bilateral primary osteoarthritis of knee: Secondary | ICD-10-CM | POA: Diagnosis not present

## 2022-04-01 DIAGNOSIS — M170 Bilateral primary osteoarthritis of knee: Secondary | ICD-10-CM | POA: Diagnosis not present

## 2022-04-02 DIAGNOSIS — F411 Generalized anxiety disorder: Secondary | ICD-10-CM | POA: Diagnosis not present

## 2022-04-02 DIAGNOSIS — M170 Bilateral primary osteoarthritis of knee: Secondary | ICD-10-CM | POA: Diagnosis not present

## 2022-04-02 DIAGNOSIS — J329 Chronic sinusitis, unspecified: Secondary | ICD-10-CM | POA: Diagnosis not present

## 2022-04-02 DIAGNOSIS — B20 Human immunodeficiency virus [HIV] disease: Secondary | ICD-10-CM | POA: Diagnosis not present

## 2022-04-03 DIAGNOSIS — F411 Generalized anxiety disorder: Secondary | ICD-10-CM | POA: Diagnosis not present

## 2022-04-03 DIAGNOSIS — M170 Bilateral primary osteoarthritis of knee: Secondary | ICD-10-CM | POA: Diagnosis not present

## 2022-04-04 DIAGNOSIS — F411 Generalized anxiety disorder: Secondary | ICD-10-CM | POA: Diagnosis not present

## 2022-04-04 DIAGNOSIS — M170 Bilateral primary osteoarthritis of knee: Secondary | ICD-10-CM | POA: Diagnosis not present

## 2022-04-05 DIAGNOSIS — F411 Generalized anxiety disorder: Secondary | ICD-10-CM | POA: Diagnosis not present

## 2022-04-05 DIAGNOSIS — M170 Bilateral primary osteoarthritis of knee: Secondary | ICD-10-CM | POA: Diagnosis not present

## 2022-04-06 DIAGNOSIS — F411 Generalized anxiety disorder: Secondary | ICD-10-CM | POA: Diagnosis not present

## 2022-04-06 DIAGNOSIS — M170 Bilateral primary osteoarthritis of knee: Secondary | ICD-10-CM | POA: Diagnosis not present

## 2022-04-07 DIAGNOSIS — F411 Generalized anxiety disorder: Secondary | ICD-10-CM | POA: Diagnosis not present

## 2022-04-07 DIAGNOSIS — M170 Bilateral primary osteoarthritis of knee: Secondary | ICD-10-CM | POA: Diagnosis not present

## 2022-04-08 DIAGNOSIS — M170 Bilateral primary osteoarthritis of knee: Secondary | ICD-10-CM | POA: Diagnosis not present

## 2022-04-09 DIAGNOSIS — F411 Generalized anxiety disorder: Secondary | ICD-10-CM | POA: Diagnosis not present

## 2022-04-09 DIAGNOSIS — M170 Bilateral primary osteoarthritis of knee: Secondary | ICD-10-CM | POA: Diagnosis not present

## 2022-04-10 DIAGNOSIS — M170 Bilateral primary osteoarthritis of knee: Secondary | ICD-10-CM | POA: Diagnosis not present

## 2022-04-10 DIAGNOSIS — F411 Generalized anxiety disorder: Secondary | ICD-10-CM | POA: Diagnosis not present

## 2022-04-11 DIAGNOSIS — M170 Bilateral primary osteoarthritis of knee: Secondary | ICD-10-CM | POA: Diagnosis not present

## 2022-04-11 DIAGNOSIS — F411 Generalized anxiety disorder: Secondary | ICD-10-CM | POA: Diagnosis not present

## 2022-04-12 DIAGNOSIS — F411 Generalized anxiety disorder: Secondary | ICD-10-CM | POA: Diagnosis not present

## 2022-04-12 DIAGNOSIS — M170 Bilateral primary osteoarthritis of knee: Secondary | ICD-10-CM | POA: Diagnosis not present

## 2022-04-13 DIAGNOSIS — F411 Generalized anxiety disorder: Secondary | ICD-10-CM | POA: Diagnosis not present

## 2022-04-13 DIAGNOSIS — M170 Bilateral primary osteoarthritis of knee: Secondary | ICD-10-CM | POA: Diagnosis not present

## 2022-04-14 DIAGNOSIS — M170 Bilateral primary osteoarthritis of knee: Secondary | ICD-10-CM | POA: Diagnosis not present

## 2022-04-14 DIAGNOSIS — F411 Generalized anxiety disorder: Secondary | ICD-10-CM | POA: Diagnosis not present

## 2022-04-15 DIAGNOSIS — M170 Bilateral primary osteoarthritis of knee: Secondary | ICD-10-CM | POA: Diagnosis not present

## 2022-04-16 DIAGNOSIS — M170 Bilateral primary osteoarthritis of knee: Secondary | ICD-10-CM | POA: Diagnosis not present

## 2022-04-16 DIAGNOSIS — F411 Generalized anxiety disorder: Secondary | ICD-10-CM | POA: Diagnosis not present

## 2022-04-17 DIAGNOSIS — F411 Generalized anxiety disorder: Secondary | ICD-10-CM | POA: Diagnosis not present

## 2022-04-17 DIAGNOSIS — M170 Bilateral primary osteoarthritis of knee: Secondary | ICD-10-CM | POA: Diagnosis not present

## 2022-04-18 DIAGNOSIS — M170 Bilateral primary osteoarthritis of knee: Secondary | ICD-10-CM | POA: Diagnosis not present

## 2022-04-18 DIAGNOSIS — F411 Generalized anxiety disorder: Secondary | ICD-10-CM | POA: Diagnosis not present

## 2022-04-19 DIAGNOSIS — F411 Generalized anxiety disorder: Secondary | ICD-10-CM | POA: Diagnosis not present

## 2022-04-19 DIAGNOSIS — M170 Bilateral primary osteoarthritis of knee: Secondary | ICD-10-CM | POA: Diagnosis not present

## 2022-04-20 DIAGNOSIS — M170 Bilateral primary osteoarthritis of knee: Secondary | ICD-10-CM | POA: Diagnosis not present

## 2022-04-20 DIAGNOSIS — F411 Generalized anxiety disorder: Secondary | ICD-10-CM | POA: Diagnosis not present

## 2022-04-21 DIAGNOSIS — F411 Generalized anxiety disorder: Secondary | ICD-10-CM | POA: Diagnosis not present

## 2022-04-21 DIAGNOSIS — M170 Bilateral primary osteoarthritis of knee: Secondary | ICD-10-CM | POA: Diagnosis not present

## 2022-04-22 DIAGNOSIS — M170 Bilateral primary osteoarthritis of knee: Secondary | ICD-10-CM | POA: Diagnosis not present

## 2022-04-23 DIAGNOSIS — M170 Bilateral primary osteoarthritis of knee: Secondary | ICD-10-CM | POA: Diagnosis not present

## 2022-04-23 DIAGNOSIS — F411 Generalized anxiety disorder: Secondary | ICD-10-CM | POA: Diagnosis not present

## 2022-04-24 DIAGNOSIS — M170 Bilateral primary osteoarthritis of knee: Secondary | ICD-10-CM | POA: Diagnosis not present

## 2022-04-24 DIAGNOSIS — F411 Generalized anxiety disorder: Secondary | ICD-10-CM | POA: Diagnosis not present

## 2022-04-25 DIAGNOSIS — M170 Bilateral primary osteoarthritis of knee: Secondary | ICD-10-CM | POA: Diagnosis not present

## 2022-04-25 DIAGNOSIS — F411 Generalized anxiety disorder: Secondary | ICD-10-CM | POA: Diagnosis not present

## 2022-04-26 DIAGNOSIS — F411 Generalized anxiety disorder: Secondary | ICD-10-CM | POA: Diagnosis not present

## 2022-04-26 DIAGNOSIS — M170 Bilateral primary osteoarthritis of knee: Secondary | ICD-10-CM | POA: Diagnosis not present

## 2022-04-27 DIAGNOSIS — M170 Bilateral primary osteoarthritis of knee: Secondary | ICD-10-CM | POA: Diagnosis not present

## 2022-04-27 DIAGNOSIS — F411 Generalized anxiety disorder: Secondary | ICD-10-CM | POA: Diagnosis not present

## 2022-04-28 DIAGNOSIS — F411 Generalized anxiety disorder: Secondary | ICD-10-CM | POA: Diagnosis not present

## 2022-04-28 DIAGNOSIS — M170 Bilateral primary osteoarthritis of knee: Secondary | ICD-10-CM | POA: Diagnosis not present

## 2022-04-29 DIAGNOSIS — M170 Bilateral primary osteoarthritis of knee: Secondary | ICD-10-CM | POA: Diagnosis not present

## 2022-04-30 DIAGNOSIS — M170 Bilateral primary osteoarthritis of knee: Secondary | ICD-10-CM | POA: Diagnosis not present

## 2022-04-30 DIAGNOSIS — F411 Generalized anxiety disorder: Secondary | ICD-10-CM | POA: Diagnosis not present

## 2022-05-01 DIAGNOSIS — M170 Bilateral primary osteoarthritis of knee: Secondary | ICD-10-CM | POA: Diagnosis not present

## 2022-05-01 DIAGNOSIS — F411 Generalized anxiety disorder: Secondary | ICD-10-CM | POA: Diagnosis not present

## 2022-05-02 DIAGNOSIS — M170 Bilateral primary osteoarthritis of knee: Secondary | ICD-10-CM | POA: Diagnosis not present

## 2022-05-03 DIAGNOSIS — M722 Plantar fascial fibromatosis: Secondary | ICD-10-CM | POA: Diagnosis not present

## 2022-05-03 DIAGNOSIS — M170 Bilateral primary osteoarthritis of knee: Secondary | ICD-10-CM | POA: Diagnosis not present

## 2022-05-03 DIAGNOSIS — M19079 Primary osteoarthritis, unspecified ankle and foot: Secondary | ICD-10-CM | POA: Diagnosis not present

## 2022-05-04 DIAGNOSIS — M170 Bilateral primary osteoarthritis of knee: Secondary | ICD-10-CM | POA: Diagnosis not present

## 2022-05-06 DIAGNOSIS — J069 Acute upper respiratory infection, unspecified: Secondary | ICD-10-CM | POA: Diagnosis not present

## 2022-05-06 DIAGNOSIS — J029 Acute pharyngitis, unspecified: Secondary | ICD-10-CM | POA: Diagnosis not present

## 2022-05-07 DIAGNOSIS — F411 Generalized anxiety disorder: Secondary | ICD-10-CM | POA: Diagnosis not present

## 2022-05-07 DIAGNOSIS — M170 Bilateral primary osteoarthritis of knee: Secondary | ICD-10-CM | POA: Diagnosis not present

## 2022-05-08 DIAGNOSIS — F411 Generalized anxiety disorder: Secondary | ICD-10-CM | POA: Diagnosis not present

## 2022-05-08 DIAGNOSIS — M170 Bilateral primary osteoarthritis of knee: Secondary | ICD-10-CM | POA: Diagnosis not present

## 2022-05-09 DIAGNOSIS — M170 Bilateral primary osteoarthritis of knee: Secondary | ICD-10-CM | POA: Diagnosis not present

## 2022-05-10 DIAGNOSIS — M170 Bilateral primary osteoarthritis of knee: Secondary | ICD-10-CM | POA: Diagnosis not present

## 2022-05-11 DIAGNOSIS — M170 Bilateral primary osteoarthritis of knee: Secondary | ICD-10-CM | POA: Diagnosis not present

## 2022-05-12 DIAGNOSIS — M170 Bilateral primary osteoarthritis of knee: Secondary | ICD-10-CM | POA: Diagnosis not present

## 2022-05-13 DIAGNOSIS — M170 Bilateral primary osteoarthritis of knee: Secondary | ICD-10-CM | POA: Diagnosis not present

## 2022-05-14 DIAGNOSIS — M170 Bilateral primary osteoarthritis of knee: Secondary | ICD-10-CM | POA: Diagnosis not present

## 2022-05-14 DIAGNOSIS — F411 Generalized anxiety disorder: Secondary | ICD-10-CM | POA: Diagnosis not present

## 2022-05-15 DIAGNOSIS — F411 Generalized anxiety disorder: Secondary | ICD-10-CM | POA: Diagnosis not present

## 2022-05-15 DIAGNOSIS — M170 Bilateral primary osteoarthritis of knee: Secondary | ICD-10-CM | POA: Diagnosis not present

## 2022-05-16 DIAGNOSIS — M170 Bilateral primary osteoarthritis of knee: Secondary | ICD-10-CM | POA: Diagnosis not present

## 2022-05-17 DIAGNOSIS — M170 Bilateral primary osteoarthritis of knee: Secondary | ICD-10-CM | POA: Diagnosis not present

## 2022-05-18 DIAGNOSIS — M170 Bilateral primary osteoarthritis of knee: Secondary | ICD-10-CM | POA: Diagnosis not present

## 2022-05-19 DIAGNOSIS — M170 Bilateral primary osteoarthritis of knee: Secondary | ICD-10-CM | POA: Diagnosis not present

## 2022-05-20 DIAGNOSIS — M170 Bilateral primary osteoarthritis of knee: Secondary | ICD-10-CM | POA: Diagnosis not present

## 2022-05-21 DIAGNOSIS — M170 Bilateral primary osteoarthritis of knee: Secondary | ICD-10-CM | POA: Diagnosis not present

## 2022-05-22 DIAGNOSIS — M170 Bilateral primary osteoarthritis of knee: Secondary | ICD-10-CM | POA: Diagnosis not present

## 2022-05-23 DIAGNOSIS — M170 Bilateral primary osteoarthritis of knee: Secondary | ICD-10-CM | POA: Diagnosis not present

## 2022-05-24 DIAGNOSIS — M170 Bilateral primary osteoarthritis of knee: Secondary | ICD-10-CM | POA: Diagnosis not present

## 2022-05-25 DIAGNOSIS — M170 Bilateral primary osteoarthritis of knee: Secondary | ICD-10-CM | POA: Diagnosis not present

## 2022-05-26 DIAGNOSIS — M170 Bilateral primary osteoarthritis of knee: Secondary | ICD-10-CM | POA: Diagnosis not present

## 2022-05-27 DIAGNOSIS — M170 Bilateral primary osteoarthritis of knee: Secondary | ICD-10-CM | POA: Diagnosis not present

## 2022-05-28 DIAGNOSIS — M170 Bilateral primary osteoarthritis of knee: Secondary | ICD-10-CM | POA: Diagnosis not present

## 2022-05-29 DIAGNOSIS — M170 Bilateral primary osteoarthritis of knee: Secondary | ICD-10-CM | POA: Diagnosis not present

## 2022-05-30 DIAGNOSIS — M170 Bilateral primary osteoarthritis of knee: Secondary | ICD-10-CM | POA: Diagnosis not present

## 2022-05-31 DIAGNOSIS — M170 Bilateral primary osteoarthritis of knee: Secondary | ICD-10-CM | POA: Diagnosis not present

## 2022-06-01 DIAGNOSIS — M170 Bilateral primary osteoarthritis of knee: Secondary | ICD-10-CM | POA: Diagnosis not present

## 2022-06-02 DIAGNOSIS — M170 Bilateral primary osteoarthritis of knee: Secondary | ICD-10-CM | POA: Diagnosis not present

## 2022-06-04 DIAGNOSIS — Z8719 Personal history of other diseases of the digestive system: Secondary | ICD-10-CM | POA: Diagnosis not present

## 2022-06-04 DIAGNOSIS — S96092A Other injury of muscle and tendon of long flexor muscle of toe at ankle and foot level, left foot, initial encounter: Secondary | ICD-10-CM | POA: Diagnosis not present

## 2022-06-04 DIAGNOSIS — Z1239 Encounter for other screening for malignant neoplasm of breast: Secondary | ICD-10-CM | POA: Diagnosis not present

## 2022-06-04 DIAGNOSIS — Z1211 Encounter for screening for malignant neoplasm of colon: Secondary | ICD-10-CM | POA: Diagnosis not present

## 2022-06-15 DIAGNOSIS — M170 Bilateral primary osteoarthritis of knee: Secondary | ICD-10-CM | POA: Diagnosis not present

## 2022-06-15 DIAGNOSIS — M25551 Pain in right hip: Secondary | ICD-10-CM | POA: Diagnosis not present

## 2022-06-18 ENCOUNTER — Emergency Department
Admission: EM | Admit: 2022-06-18 | Discharge: 2022-06-18 | Disposition: A | Discharge status: Home / Self Care | Payer: BC Managed Care – PPO | Attending: Emergency Medicine | Admitting: Emergency Medicine

## 2022-06-18 ENCOUNTER — Other Ambulatory Visit: Payer: Self-pay

## 2022-06-18 DIAGNOSIS — M170 Bilateral primary osteoarthritis of knee: Secondary | ICD-10-CM | POA: Diagnosis not present

## 2022-06-18 DIAGNOSIS — W01198A Fall on same level from slipping, tripping and stumbling with subsequent striking against other object, initial encounter: Secondary | ICD-10-CM | POA: Diagnosis not present

## 2022-06-18 DIAGNOSIS — I10 Essential (primary) hypertension: Secondary | ICD-10-CM | POA: Diagnosis not present

## 2022-06-18 DIAGNOSIS — S01511A Laceration without foreign body of lip, initial encounter: Secondary | ICD-10-CM | POA: Diagnosis not present

## 2022-06-18 DIAGNOSIS — S0993XA Unspecified injury of face, initial encounter: Secondary | ICD-10-CM | POA: Diagnosis present

## 2022-06-18 DIAGNOSIS — J45909 Unspecified asthma, uncomplicated: Secondary | ICD-10-CM | POA: Insufficient documentation

## 2022-06-18 MED ORDER — SULFAMETHOXAZOLE-TRIMETHOPRIM 800-160 MG PO TABS: 1.0000 | ORAL_TABLET | Freq: Two times a day (BID) | ORAL | 0 refills | Status: DC

## 2022-06-18 NOTE — ED Provider Notes (Signed)
[facility] Provider Note    Event Date/Time   First MD Initiated Contact with Patient 06/18/22 1524     (approximate)   History   Laceration   HPI  Diane Castillo is a 52 y.o. female with past medical history of asthma, hypertension, IDA, and as listed in the EMR presents to the emergency department for treatment and evaluation of laceration to her lip.  Patient states that she tripped last night and bit her upper lip. She didn't come last night because she thought it may close on it's own, but it didn't. No other injury associated with the trip/fall.     Physical Exam   Triage Vital Signs: ED Triage Vitals  Enc Vitals Group     BP 06/18/22 1509 (!) 162/115     Pulse Rate 06/18/22 1509 76     Resp 06/18/22 1509 20     Temp 06/18/22 1509 98.4 F (36.9 C)     Temp Source 06/18/22 1509 Oral     SpO2 06/18/22 1509 99 %     Weight 06/18/22 1435 220 lb 7.4 oz (100 kg)     Height 06/18/22 1435 5\' 5"  (1.651 m)     Head Circumference --      Peak Flow --      Pain Score 06/18/22 1435 2     Pain Loc --      Pain Edu? --      Excl. in [place]? --     Most recent vital signs: Vitals:   06/18/22 1509  BP: (!) 162/115  Pulse: 76  Resp: 20  Temp: 98.4 F (36.9 C)  SpO2: 99%     General: Awake, no distress.  CV:  Good peripheral perfusion.  Resp:  Normal effort.  Abd:  No distention.  Other:  Flap laceration to the upper lip on left side.   ED Results / Procedures / Treatments   Labs (all labs ordered are listed, but only abnormal results are displayed) Labs Reviewed - No data to display   EKG  Not indicated.   RADIOLOGY   PROCEDURES:  Critical Care performed: No  ..Laceration Repair  Date/Time: 06/18/2022 11:38 PM  Performed by: Victorino Dike, FNP Authorized by: Victorino Dike, FNP   Consent:    Consent obtained:  Verbal   Consent given by:  Patient   Risks discussed:  Infection and poor cosmetic result Universal  protocol:    Patient identity confirmed:  Verbally with patient Anesthesia:    Anesthesia method:  None Laceration details:    Location:  Lip   Lip location:  Upper exterior lip   Length (cm):  0.5 Pre-procedure details:    Preparation:  Patient was prepped and draped in usual sterile fashion Treatment:    Area cleansed with:  Saline   Irrigation method:  Tap Skin repair:    Repair method:  Tissue adhesive Approximation:    Approximation:  Close Repair type:    Repair type:  Simple Post-procedure details:    Procedure completion:  Tolerated well, no immediate complications    MEDICATIONS ORDERED IN ED: Medications - No data to display   IMPRESSION / MDM / [place] / ED COURSE  I reviewed the triage vital signs and the nursing notes.                              Differential diagnosis includes, but  is not limited to, lip laceration, dental injury, maxillofacial bone fracture  Patient's presentation is most consistent with acute, uncomplicated illness.  52 year old female presenting to the emergency department greater than 12 hours after tripping and biting her upper lip causing a flap laceration/open wound.  Laceration did not extend through the vermilion border but did require intervention in order to prevent her from biting the remaining skin off and poor cosmetic result.  She was given a prescription for antibiotics and strongly advised to take them as prescribed and until finished.  She was also strongly encouraged to follow-up with her primary care provider or return to the emergency department at any sign of infection despite taking the antibiotic.  Patient was agreeable to the plan.     FINAL CLINICAL IMPRESSION(S) / ED DIAGNOSES   Final diagnoses:  Lip laceration, initial encounter     Rx / DC Orders   ED Discharge Orders          Ordered    sulfamethoxazole-trimethoprim (BACTRIM DS) 800-160 MG tablet  2 times daily        06/18/22 1526              Note:  This document was prepared using Dragon voice recognition software and may include unintentional dictation errors.   Victorino Dike, FNP 06/18/22 3474    Nathaniel Man, MD 06/23/22 [phone]

## 2022-06-18 NOTE — ED Triage Notes (Signed)
Pt reports last night bit her left lower lip and needs someone to see if it needs to be stitched. Pt states if it does not she can just go home

## 2022-06-18 NOTE — ED Notes (Signed)
Pt d/c by provider.

## 2022-06-19 DIAGNOSIS — M170 Bilateral primary osteoarthritis of knee: Secondary | ICD-10-CM | POA: Diagnosis not present

## 2022-06-20 DIAGNOSIS — M170 Bilateral primary osteoarthritis of knee: Secondary | ICD-10-CM | POA: Diagnosis not present

## 2022-06-21 DIAGNOSIS — M170 Bilateral primary osteoarthritis of knee: Secondary | ICD-10-CM | POA: Diagnosis not present

## 2022-06-22 DIAGNOSIS — M170 Bilateral primary osteoarthritis of knee: Secondary | ICD-10-CM | POA: Diagnosis not present

## 2022-06-23 DIAGNOSIS — M170 Bilateral primary osteoarthritis of knee: Secondary | ICD-10-CM | POA: Diagnosis not present

## 2022-06-24 DIAGNOSIS — M170 Bilateral primary osteoarthritis of knee: Secondary | ICD-10-CM | POA: Diagnosis not present

## 2022-06-25 DIAGNOSIS — M79672 Pain in left foot: Secondary | ICD-10-CM | POA: Diagnosis not present

## 2022-06-25 DIAGNOSIS — M170 Bilateral primary osteoarthritis of knee: Secondary | ICD-10-CM | POA: Diagnosis not present

## 2022-06-25 DIAGNOSIS — M7662 Achilles tendinitis, left leg: Secondary | ICD-10-CM | POA: Diagnosis not present

## 2022-06-25 DIAGNOSIS — M722 Plantar fascial fibromatosis: Secondary | ICD-10-CM | POA: Diagnosis not present

## 2022-06-25 DIAGNOSIS — R262 Difficulty in walking, not elsewhere classified: Secondary | ICD-10-CM | POA: Diagnosis not present

## 2022-06-26 DIAGNOSIS — M170 Bilateral primary osteoarthritis of knee: Secondary | ICD-10-CM | POA: Diagnosis not present

## 2022-06-27 DIAGNOSIS — M170 Bilateral primary osteoarthritis of knee: Secondary | ICD-10-CM | POA: Diagnosis not present

## 2022-07-01 DIAGNOSIS — M170 Bilateral primary osteoarthritis of knee: Secondary | ICD-10-CM | POA: Diagnosis not present

## 2022-07-14 DIAGNOSIS — M170 Bilateral primary osteoarthritis of knee: Secondary | ICD-10-CM | POA: Diagnosis not present

## 2022-07-15 DIAGNOSIS — M170 Bilateral primary osteoarthritis of knee: Secondary | ICD-10-CM | POA: Diagnosis not present

## 2022-07-16 DIAGNOSIS — M170 Bilateral primary osteoarthritis of knee: Secondary | ICD-10-CM | POA: Diagnosis not present

## 2022-07-17 DIAGNOSIS — M170 Bilateral primary osteoarthritis of knee: Secondary | ICD-10-CM | POA: Diagnosis not present

## 2022-07-18 DIAGNOSIS — M170 Bilateral primary osteoarthritis of knee: Secondary | ICD-10-CM | POA: Diagnosis not present

## 2022-08-13 DIAGNOSIS — M19031 Primary osteoarthritis, right wrist: Secondary | ICD-10-CM | POA: Diagnosis not present

## 2022-08-13 DIAGNOSIS — S6991XA Unspecified injury of right wrist, hand and finger(s), initial encounter: Secondary | ICD-10-CM | POA: Diagnosis not present

## 2022-08-13 DIAGNOSIS — M25531 Pain in right wrist: Secondary | ICD-10-CM | POA: Diagnosis not present

## 2022-08-13 DIAGNOSIS — M19041 Primary osteoarthritis, right hand: Secondary | ICD-10-CM | POA: Diagnosis not present

## 2022-08-25 DIAGNOSIS — M170 Bilateral primary osteoarthritis of knee: Secondary | ICD-10-CM | POA: Diagnosis not present

## 2022-08-26 DIAGNOSIS — M170 Bilateral primary osteoarthritis of knee: Secondary | ICD-10-CM | POA: Diagnosis not present

## 2022-08-27 DIAGNOSIS — M170 Bilateral primary osteoarthritis of knee: Secondary | ICD-10-CM | POA: Diagnosis not present

## 2022-08-28 DIAGNOSIS — M170 Bilateral primary osteoarthritis of knee: Secondary | ICD-10-CM | POA: Diagnosis not present

## 2022-08-29 DIAGNOSIS — M170 Bilateral primary osteoarthritis of knee: Secondary | ICD-10-CM | POA: Diagnosis not present

## 2022-08-30 DIAGNOSIS — M170 Bilateral primary osteoarthritis of knee: Secondary | ICD-10-CM | POA: Diagnosis not present

## 2022-08-31 DIAGNOSIS — M170 Bilateral primary osteoarthritis of knee: Secondary | ICD-10-CM | POA: Diagnosis not present

## 2022-09-01 DIAGNOSIS — M170 Bilateral primary osteoarthritis of knee: Secondary | ICD-10-CM | POA: Diagnosis not present

## 2022-09-02 DIAGNOSIS — M170 Bilateral primary osteoarthritis of knee: Secondary | ICD-10-CM | POA: Diagnosis not present

## 2022-09-03 DIAGNOSIS — M170 Bilateral primary osteoarthritis of knee: Secondary | ICD-10-CM | POA: Diagnosis not present

## 2022-09-04 DIAGNOSIS — M170 Bilateral primary osteoarthritis of knee: Secondary | ICD-10-CM | POA: Diagnosis not present

## 2022-09-05 DIAGNOSIS — M170 Bilateral primary osteoarthritis of knee: Secondary | ICD-10-CM | POA: Diagnosis not present

## 2022-09-06 DIAGNOSIS — M170 Bilateral primary osteoarthritis of knee: Secondary | ICD-10-CM | POA: Diagnosis not present

## 2022-09-07 DIAGNOSIS — M170 Bilateral primary osteoarthritis of knee: Secondary | ICD-10-CM | POA: Diagnosis not present

## 2022-09-08 DIAGNOSIS — M170 Bilateral primary osteoarthritis of knee: Secondary | ICD-10-CM | POA: Diagnosis not present

## 2022-09-09 ENCOUNTER — Emergency Department
Admission: EM | Admit: 2022-09-09 | Discharge: 2022-09-09 | Disposition: A | Discharge status: Home / Self Care | Payer: BC Managed Care – PPO | Attending: Emergency Medicine | Admitting: Emergency Medicine

## 2022-09-09 ENCOUNTER — Other Ambulatory Visit: Payer: Self-pay

## 2022-09-09 DIAGNOSIS — L03012 Cellulitis of left finger: Secondary | ICD-10-CM | POA: Diagnosis not present

## 2022-09-09 DIAGNOSIS — M79642 Pain in left hand: Secondary | ICD-10-CM | POA: Diagnosis present

## 2022-09-09 DIAGNOSIS — M170 Bilateral primary osteoarthritis of knee: Secondary | ICD-10-CM | POA: Diagnosis not present

## 2022-09-09 LAB — CBC
HCT: 42.9 % (ref 36.0–46.0)
Hemoglobin: 13.6 g/dL (ref 12.0–15.0)
MCH: 30.6 pg (ref 26.0–34.0)
MCHC: 31.7 g/dL (ref 30.0–36.0)
MCV: 96.4 fL (ref 80.0–100.0)
Platelets: 208 10*3/uL (ref 150–400)
RBC: 4.45 MIL/uL (ref 3.87–5.11)
RDW: 13 % (ref 11.5–15.5)
WBC: 4.2 10*3/uL (ref 4.0–10.5)
nRBC: 0 % (ref 0.0–0.2)

## 2022-09-09 LAB — BASIC METABOLIC PANEL
Anion gap: 3 — ABNORMAL LOW (ref 5–15)
BUN: 21 mg/dL — ABNORMAL HIGH (ref 6–20)
CO2: 23 mmol/L (ref 22–32)
Calcium: 8.5 mg/dL — ABNORMAL LOW (ref 8.9–10.3)
Chloride: 110 mmol/L (ref 98–111)
Creatinine, Ser: 1 mg/dL (ref 0.44–1.00)
GFR, Estimated: >60 mL/min (ref >60)
Glucose, Bld: 145 mg/dL — ABNORMAL HIGH (ref 70–99)
Potassium: 3.6 mmol/L (ref 3.5–5.1)
Sodium: 136 mmol/L (ref 135–145)

## 2022-09-09 MED ORDER — SULFAMETHOXAZOLE-TRIMETHOPRIM 800-160 MG PO TABS
1.0000 | ORAL_TABLET | Freq: Once | ORAL | Status: AC
  Administered 2022-09-09: 1 via ORAL
  Filled 2022-09-09: qty 1

## 2022-09-09 MED ORDER — HYDROCODONE-ACETAMINOPHEN 5-325 MG PO TABS: 1.0000 | ORAL_TABLET | ORAL | 0 refills | Status: AC | PRN

## 2022-09-09 MED ORDER — HYDROCODONE-ACETAMINOPHEN 5-325 MG PO TABS
1.0000 | ORAL_TABLET | Freq: Once | ORAL | Status: AC
  Administered 2022-09-09: 1 via ORAL
  Filled 2022-09-09: qty 1

## 2022-09-09 MED ORDER — SULFAMETHOXAZOLE-TRIMETHOPRIM 800-160 MG PO TABS: 1.0000 | ORAL_TABLET | Freq: Two times a day (BID) | ORAL | 0 refills | Status: DC

## 2022-09-09 NOTE — ED Triage Notes (Signed)
Pt to ED via POV from home. Pt reports left hand pain and swelling. Pt states she thinks she was bit by something. Pt also states possible bite of left leg.

## 2022-09-09 NOTE — ED Provider Notes (Signed)
[facility] Provider Note  Patient Contact: 3:51 PM (approximate)   History   Hand Pain   HPI  Diane Castillo is a 52 y.o. female who presents emergency department complaining of pain to the middle finger of her left hand.  Patient states that the tip of her finger appears more swollen than normal.  She does have chronic deformity due to a previous injury.  She has limited range of motion at baseline due to this previous injury.  However she has been able to push on the very distal aspect of the finger and express a few drops of pus.  No fevers or chills.  No other complaints at this time.     Physical Exam   Triage Vital Signs: ED Triage Vitals  Enc Vitals Group     BP 09/09/22 1443 (!) 121/107     Pulse Rate 09/09/22 1443 76     Resp 09/09/22 1443 18     Temp 09/09/22 1443 98.7 F (37.1 C)     Temp Source 09/09/22 1443 Oral     SpO2 09/09/22 1443 96 %     Weight --      Height --      Head Circumference --      Peak Flow --      Pain Score 09/09/22 1447 6     Pain Loc --      Pain Edu? --      Excl. in [place]? --     Most recent vital signs: Vitals:   09/09/22 1443  BP: (!) 121/107  Pulse: 76  Resp: 18  Temp: 98.7 F (37.1 C)  SpO2: 96%     General: Alert and in no acute distress.  Cardiovascular:  Good peripheral perfusion Respiratory: Normal respiratory effort without tachypnea or retractions. Lungs CTAB.  Musculoskeletal: Full range of motion to all extremities.  Visualization of the left middle finger reveals chronic deformity.  There is a small amount of appears to be pus right at the very distal aspect of the finger.  While this is not paronychia/around the nail, it is not a true felon's either.  Palpation does elicit a small drop of pus.  There is no edema or erythema along the digit itself. Neurologic:  No gross focal neurologic deficits are appreciated.  Skin:   No rash noted Other:   ED Results / Procedures / Treatments    Labs (all labs ordered are listed, but only abnormal results are displayed) Labs Reviewed  BASIC METABOLIC PANEL - Abnormal; Notable for the following components:      Result Value   Glucose, Bld 145 (*)    BUN 21 (*)    Calcium 8.5 (*)    Anion gap 3 (*)    All other components within normal limits  CBC     EKG     RADIOLOGY    No results found.  PROCEDURES:  Critical Care performed: No  ..Incision and Drainage  Date/Time: 09/09/2022 4:07 PM  Performed by: Darletta Moll, PA-C Authorized by: Darletta Moll, PA-C   Consent:    Consent obtained:  Verbal   Consent given by:  Patient   Risks discussed:  Bleeding, incomplete drainage and pain Universal protocol:    Procedure explained and questions answered to patient or proxy's satisfaction: yes     Immediately prior to procedure, a time out was called: yes     Patient identity confirmed:  Verbally with patient Location:  Type:  Abscess   Location:  Upper extremity   Upper extremity location:  Finger   Finger location:  L long finger Anesthesia:    Anesthesia method:  None Procedure type:    Complexity:  Simple Procedure details:    Needle aspiration: yes     Needle size:  18 G   Incision depth:  Dermal   Drainage amount:  Scant   Wound treatment:  Wound left open   Packing materials:  None Post-procedure details:    Procedure completion:  Tolerated well, no immediate complications    MEDICATIONS ORDERED IN ED: Medications  sulfamethoxazole-trimethoprim (BACTRIM DS) 800-160 MG per tablet 1 tablet (1 tablet Oral Given 09/09/22 1558)  HYDROcodone-acetaminophen (NORCO/VICODIN) 5-325 MG per tablet 1 tablet (1 tablet Oral Given 09/09/22 1558)     IMPRESSION / MDM / ASSESSMENT AND PLAN / ED COURSE  I reviewed the triage vital signs and the nursing notes.                                 Differential diagnosis includes, but is not limited to, paronychia, felon's, infectious  tenosynovitis, osteomyelitis   Patient's presentation is most consistent with acute presentation with potential threat to life or bodily function.   Patient's diagnosis is consistent with felon.  Patient presents emergency department complaining of a fingertip infection to the left hand.  There is a very small pus collection at the distal aspect of the middle finger left hand.  No concern for infectious tenosynovitis.  Patient has a small area opened with an [id number] needle which elicited more pus.  Ongoing care instructions discussed with patient.  Antibiotics and very limited pain medication.  Follow-up with primary care as needed.  Return precautions discussed with patient..  Patient is given ED precautions to return to the ED for any worsening or new symptoms.     FINAL CLINICAL IMPRESSION(S) / ED DIAGNOSES   Final diagnoses:  Felon of finger of left hand     Rx / DC Orders   ED Discharge Orders          Ordered    sulfamethoxazole-trimethoprim (BACTRIM DS) 800-160 MG tablet  2 times daily        09/09/22 1604    HYDROcodone-acetaminophen (NORCO/VICODIN) 5-325 MG tablet  Every 4 hours PRN        09/09/22 1604             Note:  This document was prepared using Dragon voice recognition software and may include unintentional dictation errors.   Darletta Moll, PA-C 09/09/22 1608    Naaman Plummer, MD 09/09/22 [phone]

## 2022-09-10 ENCOUNTER — Telehealth: Payer: Self-pay

## 2022-09-10 DIAGNOSIS — M170 Bilateral primary osteoarthritis of knee: Secondary | ICD-10-CM | POA: Diagnosis not present

## 2022-09-10 NOTE — Transitions of Care (Post Inpatient/ED Visit) (Signed)
   09/10/2022  Name: Diane Castillo MRN: [id number] DOB: [birth year]  Today's TOC FU Call Status: Today's TOC FU Call Status:: Successful TOC FU Call Competed TOC FU Call Complete Date: 09/10/22  Transition Care Management Follow-up Telephone Call Date of Discharge: 09/09/22 Discharge Facility: [facility]) Type of Discharge: Emergency Department Reason for ED Visit: Other: (Finger swelling) How have you been since you were released from the hospital?: Worse Any questions or concerns?: Yes Patient Questions/Concerns:: I still have swelling and my hand hurts  Items Reviewed: Did you receive and understand the discharge instructions provided?: Yes Medications obtained and verified?: Yes (Medications Reviewed) Any new allergies since your discharge?: No Dietary orders reviewed?: NA Do you have support at home?: Yes  Home Care and Equipment/Supplies: [place] Ordered?: NA Any new equipment or medical supplies ordered?: NA  Functional Questionnaire: Do you need assistance with bathing/showering or dressing?: No Do you need assistance with meal preparation?: No Do you need assistance with eating?: No Do you have difficulty maintaining continence: No Do you need assistance with getting out of bed/getting out of a chair/moving?: No Do you have difficulty managing or taking your medications?: No  Follow up appointments reviewed: PCP Follow-up appointment confirmed?: No MD Provider Line Number:[phone] Given: No Specialist Hospital Follow-up appointment confirmed?: No Do you need transportation to your follow-up appointment?: No Do you understand care options if your condition(s) worsen?: Yes-patient verbalized understanding    Mickel Fuchs, BSW, [place] Medicaid Team  [phone]

## 2022-09-11 DIAGNOSIS — M170 Bilateral primary osteoarthritis of knee: Secondary | ICD-10-CM | POA: Diagnosis not present

## 2022-09-12 DIAGNOSIS — M170 Bilateral primary osteoarthritis of knee: Secondary | ICD-10-CM | POA: Diagnosis not present

## 2022-09-13 DIAGNOSIS — M170 Bilateral primary osteoarthritis of knee: Secondary | ICD-10-CM | POA: Diagnosis not present

## 2022-09-14 DIAGNOSIS — M170 Bilateral primary osteoarthritis of knee: Secondary | ICD-10-CM | POA: Diagnosis not present

## 2022-09-15 DIAGNOSIS — M170 Bilateral primary osteoarthritis of knee: Secondary | ICD-10-CM | POA: Diagnosis not present

## 2022-09-16 DIAGNOSIS — M170 Bilateral primary osteoarthritis of knee: Secondary | ICD-10-CM | POA: Diagnosis not present

## 2022-09-17 DIAGNOSIS — M170 Bilateral primary osteoarthritis of knee: Secondary | ICD-10-CM | POA: Diagnosis not present

## 2022-09-18 DIAGNOSIS — M170 Bilateral primary osteoarthritis of knee: Secondary | ICD-10-CM | POA: Diagnosis not present

## 2022-09-19 DIAGNOSIS — M170 Bilateral primary osteoarthritis of knee: Secondary | ICD-10-CM | POA: Diagnosis not present

## 2022-09-19 DIAGNOSIS — Z1231 Encounter for screening mammogram for malignant neoplasm of breast: Secondary | ICD-10-CM | POA: Diagnosis not present

## 2022-09-20 DIAGNOSIS — M170 Bilateral primary osteoarthritis of knee: Secondary | ICD-10-CM | POA: Diagnosis not present

## 2022-09-21 DIAGNOSIS — M170 Bilateral primary osteoarthritis of knee: Secondary | ICD-10-CM | POA: Diagnosis not present

## 2022-09-22 DIAGNOSIS — M170 Bilateral primary osteoarthritis of knee: Secondary | ICD-10-CM | POA: Diagnosis not present

## 2022-09-23 DIAGNOSIS — M170 Bilateral primary osteoarthritis of knee: Secondary | ICD-10-CM | POA: Diagnosis not present

## 2022-09-24 DIAGNOSIS — M170 Bilateral primary osteoarthritis of knee: Secondary | ICD-10-CM | POA: Diagnosis not present

## 2022-09-25 DIAGNOSIS — M170 Bilateral primary osteoarthritis of knee: Secondary | ICD-10-CM | POA: Diagnosis not present

## 2022-09-26 DIAGNOSIS — M170 Bilateral primary osteoarthritis of knee: Secondary | ICD-10-CM | POA: Diagnosis not present

## 2022-09-27 DIAGNOSIS — M170 Bilateral primary osteoarthritis of knee: Secondary | ICD-10-CM | POA: Diagnosis not present

## 2022-09-28 DIAGNOSIS — M170 Bilateral primary osteoarthritis of knee: Secondary | ICD-10-CM | POA: Diagnosis not present

## 2022-09-29 DIAGNOSIS — M170 Bilateral primary osteoarthritis of knee: Secondary | ICD-10-CM | POA: Diagnosis not present

## 2022-09-30 DIAGNOSIS — M170 Bilateral primary osteoarthritis of knee: Secondary | ICD-10-CM | POA: Diagnosis not present

## 2022-10-01 DIAGNOSIS — M170 Bilateral primary osteoarthritis of knee: Secondary | ICD-10-CM | POA: Diagnosis not present

## 2022-10-02 DIAGNOSIS — M170 Bilateral primary osteoarthritis of knee: Secondary | ICD-10-CM | POA: Diagnosis not present

## 2022-10-03 DIAGNOSIS — M170 Bilateral primary osteoarthritis of knee: Secondary | ICD-10-CM | POA: Diagnosis not present

## 2022-10-04 DIAGNOSIS — M170 Bilateral primary osteoarthritis of knee: Secondary | ICD-10-CM | POA: Diagnosis not present

## 2022-10-05 DIAGNOSIS — M170 Bilateral primary osteoarthritis of knee: Secondary | ICD-10-CM | POA: Diagnosis not present

## 2022-10-06 DIAGNOSIS — M170 Bilateral primary osteoarthritis of knee: Secondary | ICD-10-CM | POA: Diagnosis not present

## 2022-10-07 DIAGNOSIS — M170 Bilateral primary osteoarthritis of knee: Secondary | ICD-10-CM | POA: Diagnosis not present

## 2022-10-08 DIAGNOSIS — M170 Bilateral primary osteoarthritis of knee: Secondary | ICD-10-CM | POA: Diagnosis not present

## 2022-10-09 DIAGNOSIS — M170 Bilateral primary osteoarthritis of knee: Secondary | ICD-10-CM | POA: Diagnosis not present

## 2022-10-10 DIAGNOSIS — M170 Bilateral primary osteoarthritis of knee: Secondary | ICD-10-CM | POA: Diagnosis not present

## 2022-10-11 DIAGNOSIS — M170 Bilateral primary osteoarthritis of knee: Secondary | ICD-10-CM | POA: Diagnosis not present

## 2022-10-12 DIAGNOSIS — M170 Bilateral primary osteoarthritis of knee: Secondary | ICD-10-CM | POA: Diagnosis not present

## 2022-10-13 DIAGNOSIS — M170 Bilateral primary osteoarthritis of knee: Secondary | ICD-10-CM | POA: Diagnosis not present

## 2022-10-14 DIAGNOSIS — M170 Bilateral primary osteoarthritis of knee: Secondary | ICD-10-CM | POA: Diagnosis not present

## 2022-10-15 DIAGNOSIS — M170 Bilateral primary osteoarthritis of knee: Secondary | ICD-10-CM | POA: Diagnosis not present

## 2022-10-16 DIAGNOSIS — M170 Bilateral primary osteoarthritis of knee: Secondary | ICD-10-CM | POA: Diagnosis not present

## 2022-10-17 DIAGNOSIS — M170 Bilateral primary osteoarthritis of knee: Secondary | ICD-10-CM | POA: Diagnosis not present

## 2022-10-18 DIAGNOSIS — M170 Bilateral primary osteoarthritis of knee: Secondary | ICD-10-CM | POA: Diagnosis not present

## 2022-10-19 DIAGNOSIS — M170 Bilateral primary osteoarthritis of knee: Secondary | ICD-10-CM | POA: Diagnosis not present

## 2022-10-20 DIAGNOSIS — M170 Bilateral primary osteoarthritis of knee: Secondary | ICD-10-CM | POA: Diagnosis not present

## 2022-10-21 DIAGNOSIS — M170 Bilateral primary osteoarthritis of knee: Secondary | ICD-10-CM | POA: Diagnosis not present

## 2022-10-22 DIAGNOSIS — M170 Bilateral primary osteoarthritis of knee: Secondary | ICD-10-CM | POA: Diagnosis not present

## 2022-10-23 ENCOUNTER — Encounter: Payer: Self-pay | Admitting: Emergency Medicine

## 2022-10-23 ENCOUNTER — Other Ambulatory Visit: Payer: Self-pay

## 2022-10-23 ENCOUNTER — Emergency Department: Payer: Medicaid Other | Admitting: Certified Registered Nurse Anesthetist

## 2022-10-23 ENCOUNTER — Ambulatory Visit: Payer: Self-pay | Admitting: General Surgery

## 2022-10-23 ENCOUNTER — Emergency Department: Payer: Medicaid Other

## 2022-10-23 ENCOUNTER — Encounter
Admission: EM | Disposition: A | Discharge status: Home / Self Care | Payer: Self-pay | Source: Home / Self Care | Attending: Emergency Medicine

## 2022-10-23 ENCOUNTER — Observation Stay
Admission: EM | Admit: 2022-10-23 | Discharge: 2022-10-23 | Disposition: A | Discharge status: Home / Self Care | Payer: Medicaid Other | Attending: General Surgery | Admitting: General Surgery

## 2022-10-23 DIAGNOSIS — J45909 Unspecified asthma, uncomplicated: Secondary | ICD-10-CM | POA: Diagnosis not present

## 2022-10-23 DIAGNOSIS — Z86718 Personal history of other venous thrombosis and embolism: Secondary | ICD-10-CM | POA: Diagnosis not present

## 2022-10-23 DIAGNOSIS — K611 Rectal abscess: Principal | ICD-10-CM | POA: Diagnosis present

## 2022-10-23 DIAGNOSIS — Z79899 Other long term (current) drug therapy: Secondary | ICD-10-CM | POA: Diagnosis not present

## 2022-10-23 DIAGNOSIS — Z7982 Long term (current) use of aspirin: Secondary | ICD-10-CM | POA: Insufficient documentation

## 2022-10-23 DIAGNOSIS — B20 Human immunodeficiency virus [HIV] disease: Secondary | ICD-10-CM | POA: Insufficient documentation

## 2022-10-23 DIAGNOSIS — I10 Essential (primary) hypertension: Secondary | ICD-10-CM | POA: Insufficient documentation

## 2022-10-23 DIAGNOSIS — Z87891 Personal history of nicotine dependence: Secondary | ICD-10-CM | POA: Diagnosis not present

## 2022-10-23 DIAGNOSIS — Z21 Asymptomatic human immunodeficiency virus [HIV] infection status: Secondary | ICD-10-CM | POA: Insufficient documentation

## 2022-10-23 DIAGNOSIS — K610 Anal abscess: Secondary | ICD-10-CM | POA: Diagnosis not present

## 2022-10-23 DIAGNOSIS — M170 Bilateral primary osteoarthritis of knee: Secondary | ICD-10-CM | POA: Diagnosis not present

## 2022-10-23 HISTORY — PX: INCISION AND DRAINAGE ABSCESS: SHX5864

## 2022-10-23 LAB — BASIC METABOLIC PANEL
Anion gap: 8 (ref 5–15)
BUN: 22 mg/dL — ABNORMAL HIGH (ref 6–20)
CO2: 24 mmol/L (ref 22–32)
Calcium: 8.5 mg/dL — ABNORMAL LOW (ref 8.9–10.3)
Chloride: 108 mmol/L (ref 98–111)
Creatinine, Ser: 0.85 mg/dL (ref 0.44–1.00)
GFR, Estimated: >60 mL/min (ref >60)
Glucose, Bld: 107 mg/dL — ABNORMAL HIGH (ref 70–99)
Potassium: 3.5 mmol/L (ref 3.5–5.1)
Sodium: 140 mmol/L (ref 135–145)

## 2022-10-23 LAB — CBC WITH DIFFERENTIAL/PLATELET
Abs Immature Granulocytes: 0.02 10*3/uL (ref 0.00–0.07)
Basophils Absolute: 0 10*3/uL (ref 0.0–0.1)
Basophils Relative: 0 %
Eosinophils Absolute: 0.1 10*3/uL (ref 0.0–0.5)
Eosinophils Relative: 2 %
HCT: 43 % (ref 36.0–46.0)
Hemoglobin: 13.6 g/dL (ref 12.0–15.0)
Immature Granulocytes: 0 %
Lymphocytes Relative: 22 %
Lymphs Abs: 1.6 10*3/uL (ref 0.7–4.0)
MCH: 30.8 pg (ref 26.0–34.0)
MCHC: 31.6 g/dL (ref 30.0–36.0)
MCV: 97.3 fL (ref 80.0–100.0)
Monocytes Absolute: 0.6 10*3/uL (ref 0.1–1.0)
Monocytes Relative: 8 %
Neutro Abs: 5.1 10*3/uL (ref 1.7–7.7)
Neutrophils Relative %: 68 %
Platelets: 190 10*3/uL (ref 150–400)
RBC: 4.42 MIL/uL (ref 3.87–5.11)
RDW: 13.2 % (ref 11.5–15.5)
WBC: 7.5 10*3/uL (ref 4.0–10.5)
nRBC: 0 % (ref 0.0–0.2)

## 2022-10-23 LAB — AEROBIC/ANAEROBIC CULTURE W GRAM STAIN (SURGICAL/DEEP WOUND)

## 2022-10-23 SURGERY — INCISION AND DRAINAGE, ABSCESS
Anesthesia: General

## 2022-10-23 MED ORDER — LACTATED RINGERS IV SOLN: INTRAVENOUS | Status: DC | PRN

## 2022-10-23 MED ORDER — PROMETHAZINE HCL 25 MG/ML IJ SOLN: 6.2500 mg | INTRAMUSCULAR | Status: DC | PRN

## 2022-10-23 MED ORDER — PROPOFOL 1000 MG/100ML IV EMUL
INTRAVENOUS | Status: AC
  Filled 2022-10-23: qty 100

## 2022-10-23 MED ORDER — HYDROCODONE-ACETAMINOPHEN 5-325 MG PO TABS: 1.0000 | ORAL_TABLET | ORAL | 0 refills | Status: AC | PRN

## 2022-10-23 MED ORDER — BECLOMETHASONE DIPROP HFA 40 MCG/ACT IN AERB: 1.0000 | INHALATION_SPRAY | Freq: Two times a day (BID) | RESPIRATORY_TRACT | Status: DC

## 2022-10-23 MED ORDER — METRONIDAZOLE 500 MG PO TABS: 500.0000 mg | ORAL_TABLET | Freq: Three times a day (TID) | ORAL | 0 refills | Status: AC

## 2022-10-23 MED ORDER — VALACYCLOVIR HCL 500 MG PO TABS: 500.0000 mg | ORAL_TABLET | Freq: Two times a day (BID) | ORAL | Status: DC

## 2022-10-23 MED ORDER — OXYCODONE HCL 5 MG PO TABS
ORAL_TABLET | ORAL | Status: AC
  Filled 2022-10-23: qty 1

## 2022-10-23 MED ORDER — BICTEGRAVIR-EMTRICITAB-TENOFOV 50-200-25 MG PO TABS: 1.0000 | ORAL_TABLET | Freq: Every day | ORAL | Status: DC

## 2022-10-23 MED ORDER — IOHEXOL 300 MG/ML  SOLN
100.0000 mL | Freq: Once | INTRAMUSCULAR | Status: AC | PRN
  Administered 2022-10-23: 100 mL via INTRAVENOUS

## 2022-10-23 MED ORDER — MORPHINE SULFATE (PF) 4 MG/ML IV SOLN
4.0000 mg | Freq: Once | INTRAVENOUS | Status: AC
  Administered 2022-10-23: 4 mg via INTRAVENOUS
  Filled 2022-10-23: qty 1

## 2022-10-23 MED ORDER — ONDANSETRON 4 MG PO TBDP: 4.0000 mg | ORAL_TABLET | Freq: Four times a day (QID) | ORAL | Status: DC | PRN

## 2022-10-23 MED ORDER — SULFAMETHOXAZOLE-TRIMETHOPRIM 800-160 MG PO TABS: 1.0000 | ORAL_TABLET | Freq: Two times a day (BID) | ORAL | Status: DC

## 2022-10-23 MED ORDER — EPINEPHRINE PF 1 MG/ML IJ SOLN
INTRAMUSCULAR | Status: AC
  Filled 2022-10-23: qty 1

## 2022-10-23 MED ORDER — ENOXAPARIN SODIUM 40 MG/0.4ML IJ SOSY
40.0000 mg | PREFILLED_SYRINGE | INTRAMUSCULAR | Status: DC
Start: 2022-10-23 — End: 2022-10-24

## 2022-10-23 MED ORDER — HYDROCODONE-ACETAMINOPHEN 5-325 MG PO TABS: 1.0000 | ORAL_TABLET | ORAL | Status: DC | PRN

## 2022-10-23 MED ORDER — FENTANYL CITRATE PF 50 MCG/ML IJ SOSY: 50.0000 ug | PREFILLED_SYRINGE | Freq: Once | INTRAMUSCULAR | Status: DC

## 2022-10-23 MED ORDER — DEXMEDETOMIDINE HCL IN NACL 200 MCG/50ML IV SOLN
INTRAVENOUS | Status: DC | PRN
  Administered 2022-10-23 (×5): 4 ug via INTRAVENOUS

## 2022-10-23 MED ORDER — METRONIDAZOLE 500 MG/100ML IV SOLN
500.0000 mg | Freq: Three times a day (TID) | INTRAVENOUS | Status: DC
Start: 2022-10-23 — End: 2022-10-24

## 2022-10-23 MED ORDER — ACETAMINOPHEN 10 MG/ML IV SOLN: 1000.0000 mg | Freq: Once | INTRAVENOUS | Status: DC | PRN

## 2022-10-23 MED ORDER — MIDAZOLAM HCL 2 MG/2ML IJ SOLN
INTRAMUSCULAR | Status: AC
  Filled 2022-10-23: qty 2

## 2022-10-23 MED ORDER — FENTANYL CITRATE (PF) 100 MCG/2ML IJ SOLN: 25.0000 ug | INTRAMUSCULAR | Status: DC | PRN

## 2022-10-23 MED ORDER — SUCCINYLCHOLINE CHLORIDE 200 MG/10ML IV SOSY
PREFILLED_SYRINGE | INTRAVENOUS | Status: DC | PRN
  Administered 2022-10-23: 120 mg via INTRAVENOUS

## 2022-10-23 MED ORDER — PROPOFOL 10 MG/ML IV BOLUS
INTRAVENOUS | Status: DC | PRN
  Administered 2022-10-23: 150 mg via INTRAVENOUS
  Administered 2022-10-23: 30 mg via INTRAVENOUS
  Administered 2022-10-23: 20 mg via INTRAVENOUS

## 2022-10-23 MED ORDER — DROPERIDOL 2.5 MG/ML IJ SOLN: 0.6250 mg | Freq: Once | INTRAMUSCULAR | Status: DC | PRN

## 2022-10-23 MED ORDER — CIPROFLOXACIN IN D5W 400 MG/200ML IV SOLN: 400.0000 mg | Freq: Two times a day (BID) | INTRAVENOUS | Status: DC

## 2022-10-23 MED ORDER — CIPROFLOXACIN IN D5W 400 MG/200ML IV SOLN
INTRAVENOUS | Status: AC
  Filled 2022-10-23: qty 200

## 2022-10-23 MED ORDER — DEXAMETHASONE SODIUM PHOSPHATE 10 MG/ML IJ SOLN
INTRAMUSCULAR | Status: DC | PRN
  Administered 2022-10-23: 5 mg via INTRAVENOUS

## 2022-10-23 MED ORDER — ONDANSETRON HCL 4 MG/2ML IJ SOLN: 4.0000 mg | Freq: Four times a day (QID) | INTRAMUSCULAR | Status: DC | PRN

## 2022-10-23 MED ORDER — KETOROLAC TROMETHAMINE 15 MG/ML IJ SOLN
15.0000 mg | Freq: Once | INTRAMUSCULAR | Status: AC
  Administered 2022-10-23: 15 mg via INTRAVENOUS
  Filled 2022-10-23: qty 1

## 2022-10-23 MED ORDER — BUPIVACAINE LIPOSOME 1.3 % IJ SUSP
INTRAMUSCULAR | Status: AC
  Filled 2022-10-23: qty 20

## 2022-10-23 MED ORDER — PROPOFOL 10 MG/ML IV BOLUS
INTRAVENOUS | Status: AC
  Filled 2022-10-23: qty 20

## 2022-10-23 MED ORDER — FENTANYL CITRATE (PF) 100 MCG/2ML IJ SOLN
INTRAMUSCULAR | Status: AC
  Filled 2022-10-23: qty 2

## 2022-10-23 MED ORDER — MIDAZOLAM HCL 2 MG/2ML IJ SOLN
INTRAMUSCULAR | Status: DC | PRN
  Administered 2022-10-23 (×2): 1 mg via INTRAVENOUS

## 2022-10-23 MED ORDER — ONDANSETRON HCL 4 MG/2ML IJ SOLN
INTRAMUSCULAR | Status: DC | PRN
  Administered 2022-10-23: 4 mg via INTRAVENOUS

## 2022-10-23 MED ORDER — ENALAPRIL MALEATE 10 MG PO TABS: 10.0000 mg | ORAL_TABLET | Freq: Every day | ORAL | Status: DC

## 2022-10-23 MED ORDER — DOLUTEGRAVIR SODIUM 50 MG PO TABS: 50.0000 mg | ORAL_TABLET | Freq: Every day | ORAL | Status: DC

## 2022-10-23 MED ORDER — MORPHINE SULFATE (PF) 4 MG/ML IV SOLN: 4.0000 mg | INTRAVENOUS | Status: DC | PRN

## 2022-10-23 MED ORDER — METRONIDAZOLE 500 MG/100ML IV SOLN
500.0000 mg | INTRAVENOUS | Status: AC
  Administered 2022-10-23: 500 mg via INTRAVENOUS
  Filled 2022-10-23: qty 100

## 2022-10-23 MED ORDER — CIPROFLOXACIN HCL 500 MG PO TABS: 500.0000 mg | ORAL_TABLET | Freq: Two times a day (BID) | ORAL | 0 refills | Status: AC

## 2022-10-23 MED ORDER — LIDOCAINE HCL (CARDIAC) PF 100 MG/5ML IV SOSY
PREFILLED_SYRINGE | INTRAVENOUS | Status: DC | PRN
  Administered 2022-10-23: 100 mg via INTRAVENOUS

## 2022-10-23 MED ORDER — BUPIVACAINE-EPINEPHRINE 0.25% -1:200000 IJ SOLN
INTRAMUSCULAR | Status: DC | PRN
  Administered 2022-10-23: 30 mL

## 2022-10-23 MED ORDER — BUPIVACAINE HCL (PF) 0.25 % IJ SOLN
INTRAMUSCULAR | Status: AC
  Filled 2022-10-23: qty 30

## 2022-10-23 MED ORDER — BUPIVACAINE LIPOSOME 1.3 % IJ SUSP
INTRAMUSCULAR | Status: DC | PRN
  Administered 2022-10-23: 20 mL

## 2022-10-23 MED ORDER — EMTRICITABINE-TENOFOVIR AF 200-25 MG PO TABS: 1.0000 | ORAL_TABLET | Freq: Every day | ORAL | Status: DC

## 2022-10-23 MED ORDER — HYDROMORPHONE HCL 1 MG/ML IJ SOLN
INTRAMUSCULAR | Status: DC | PRN
  Administered 2022-10-23: .5 mg via INTRAVENOUS

## 2022-10-23 MED ORDER — FENTANYL CITRATE (PF) 100 MCG/2ML IJ SOLN
50.0000 ug | Freq: Once | INTRAMUSCULAR | Status: AC
  Administered 2022-10-23: 50 ug via INTRAVENOUS

## 2022-10-23 MED ORDER — CIPROFLOXACIN IN D5W 400 MG/200ML IV SOLN
400.0000 mg | INTRAVENOUS | Status: AC
  Administered 2022-10-23: 400 mg via INTRAVENOUS

## 2022-10-23 MED ORDER — OXYCODONE HCL 5 MG PO TABS
5.0000 mg | ORAL_TABLET | Freq: Once | ORAL | Status: AC | PRN
  Administered 2022-10-23: 5 mg via ORAL

## 2022-10-23 MED ORDER — 0.9 % SODIUM CHLORIDE (POUR BTL) OPTIME
TOPICAL | Status: DC | PRN
  Administered 2022-10-23: 500 mL

## 2022-10-23 MED ORDER — HYDROMORPHONE HCL 1 MG/ML IJ SOLN
INTRAMUSCULAR | Status: AC
  Filled 2022-10-23: qty 1

## 2022-10-23 MED ORDER — OXYCODONE HCL 5 MG/5ML PO SOLN: 5.0000 mg | Freq: Once | ORAL | Status: AC | PRN

## 2022-10-23 SURGICAL SUPPLY — 38 items
BLADE SURG SZ11 CARB STEEL (BLADE) ×1 IMPLANT
BRIEF MESH DISP 2XL (UNDERPADS AND DIAPERS) ×1 IMPLANT
DRAIN PENROSE 12X.25 LTX STRL (MISCELLANEOUS) IMPLANT
DRAPE LAPAROTOMY 100X77 ABD (DRAPES) ×1 IMPLANT
DRAPE LEGGINS SURG 28X43 STRL (DRAPES) ×1 IMPLANT
DRAPE UNDER BUTTOCK W/FLU (DRAPES) IMPLANT
ELECT REM PT RETURN 9FT ADLT (ELECTROSURGICAL) ×1
ELECTRODE REM PT RTRN 9FT ADLT (ELECTROSURGICAL) ×1 IMPLANT
GAUZE 4X4 16PLY ~~LOC~~+RFID DBL (SPONGE) ×1 IMPLANT
GAUZE PACKING 0.25INX5YD STRL (GAUZE/BANDAGES/DRESSINGS) IMPLANT
GLOVE BIO SURGEON STRL SZ 6.5 (GLOVE) ×1 IMPLANT
GLOVE BIOGEL PI IND STRL 6.5 (GLOVE) ×1 IMPLANT
GOWN STRL REUS W/ TWL LRG LVL3 (GOWN DISPOSABLE) ×2 IMPLANT
GOWN STRL REUS W/TWL LRG LVL3 (GOWN DISPOSABLE) ×2
IV CATH ANGIO 12GX3 LT BLUE (NEEDLE) ×1 IMPLANT
KIT TURNOVER CYSTO (KITS) ×1 IMPLANT
LOOP VESSEL MAXI  1X406 RED (MISCELLANEOUS) ×1
LOOP VESSEL MAXI 1X406 RED (MISCELLANEOUS) ×1 IMPLANT
MANIFOLD NEPTUNE II (INSTRUMENTS) ×1 IMPLANT
NDL HYPO 25X1 1.5 SAFETY (NEEDLE) ×1 IMPLANT
NDL SAFETY ECLIP 18X1.5 (MISCELLANEOUS) ×1 IMPLANT
NEEDLE HYPO 25X1 1.5 SAFETY (NEEDLE) ×1 IMPLANT
NS IRRIG 1000ML POUR BTL (IV SOLUTION) ×1 IMPLANT
PACK BASIN MINOR ARMC (MISCELLANEOUS) IMPLANT
PAD ABD DERMACEA PRESS 5X9 (GAUZE/BANDAGES/DRESSINGS) ×1 IMPLANT
PAD OB MATERNITY 4.3X12.25 (PERSONAL CARE ITEMS) ×1 IMPLANT
PAD PREP OB/GYN DISP 24X41 (PERSONAL CARE ITEMS) ×1 IMPLANT
SOL PREP PVP 2OZ (MISCELLANEOUS) ×1
SOLUTION PREP PVP 2OZ (MISCELLANEOUS) ×1 IMPLANT
SURGILUBE 2OZ TUBE FLIPTOP (MISCELLANEOUS) ×1 IMPLANT
SUT ETHILON 3-0 FS-10 30 BLK (SUTURE)
SUTURE EHLN 3-0 FS-10 30 BLK (SUTURE) IMPLANT
SWAB CULTURE AMIES ANAERIB BLU (MISCELLANEOUS) IMPLANT
SYR 10ML LL (SYRINGE) ×2 IMPLANT
SYR 20ML LL LF (SYRINGE) IMPLANT
SYR BULB IRRIG 60ML STRL (SYRINGE) ×1 IMPLANT
TOWEL OR 17X26 4PK STRL BLUE (TOWEL DISPOSABLE) ×1 IMPLANT
TRAP FLUID SMOKE EVACUATOR (MISCELLANEOUS) ×1 IMPLANT

## 2022-10-23 NOTE — Discharge Instructions (Addendum)
  Diet: Resume home heart healthy regular diet.   Activity: Increase activity as tolerated.   Wound care: May shower with soapy water and pat dry (do not rub incisions), but no baths or submerging incision underwater until follow-up. (no swimming)   Medications: Resume all home medications. For mild to moderate pain: acetaminophen (Tylenol) or ibuprofen (if no kidney disease). Combining Tylenol with alcohol can substantially increase your risk of causing liver disease. Narcotic pain medications, if prescribed, can be used for severe pain, though may cause nausea, constipation, and drowsiness. Do not combine Tylenol and Norco within a 6 hour period as Norco contains Tylenol. If you do not need the narcotic pain medication, you do not need to fill the prescription.  Call office [phone]) at any time if any questions, worsening pain, fevers/chills, bleeding, drainage from incision site, or other concerns.  AMBULATORY SURGERY  DISCHARGE INSTRUCTIONS   The drugs that you were given will stay in your system until tomorrow so for the next 24 hours you should not:  Drive an automobile Make any legal decisions Drink any alcoholic beverage   You may resume regular meals tomorrow.  Today it is better to start with liquids and gradually work up to solid foods.  You may eat anything you prefer, but it is better to start with liquids, then soup and crackers, and gradually work up to solid foods.   Please notify your doctor immediately if you have any unusual bleeding, trouble breathing, redness and pain at the surgery site, drainage, fever, or pain not relieved by medication.    Additional Instructions: PLEASE LEAVE TEAL (EXPAREL) ARMBAND ON FOR 4 DAYS     Please contact your physician with any problems or Same Day Surgery at [phone], Monday through Friday 6 am to 4 pm, or [place] at [facility] number at [phone].

## 2022-10-23 NOTE — Op Note (Signed)
Preoperative diagnosis: Perirectal abscess.    Postoperative diagnosis: Perirectal abscess.  Procedure: Anoscopy, drainage of perirectalabscess.    Surgeon: Dr. Hazle Quant  Anesthesia: Spinal  Indications: Patient is a 52 y.o. femalewas found to have palpable perirectal abscess.   Findings: Posterior peri rectal abscess. No internal fistulous opening identified.   Description of procedure: The patient was brought to the operating room and general anesthesia was induced. The patient was then repositioned in lithotomy position. A time-out was completed verifying correct patient, procedure, site, positioning, and implant(s) and/or special equipment prior to beginning this procedure. A rectal examination was performed and the abscess was identified and confirmed to be submucosal . The patient was prepped and draped in the usual sterile fashion. Local anesthetic was injected as a perianal block. Anoscopy was performed. The area of swelling was aspirated using an 18-gauge needle and the presence of pus confirmed.  The overlying mucosa was incised and pus allowed to drain into the rectum. Loculations were broken with a hemostat. The cavity was irrigated and left open.   The patient tolerated the procedure well and was extubated and taken to the postanesthesia care unit in stable condition.   Specimen: None  Complications: None  Estimated Blood Loss: 5 mL

## 2022-10-23 NOTE — Anesthesia Procedure Notes (Signed)
Procedure Name: Intubation Date/Time: 10/23/2022 5:42 PM  Performed by: Merlene Pulling, CRNAPre-anesthesia Checklist: Patient identified, Patient being monitored, Timeout performed, Emergency Drugs available and Suction available Patient Re-evaluated:Patient Re-evaluated prior to induction Oxygen Delivery Method: Circle system utilized Preoxygenation: Pre-oxygenation with 100% oxygen Induction Type: IV induction Ventilation: Mask ventilation without difficulty Laryngoscope Size: Mac and 3 Grade View: Grade I Tube type: Oral Tube size: 7.0 mm Number of attempts: 1 Airway Equipment and Method: Stylet Placement Confirmation: ETT inserted through vocal cords under direct vision, positive ETCO2 and breath sounds checked- equal and bilateral Secured at: 21 cm Tube secured with: Tape Dental Injury: Teeth and Oropharynx as per pre-operative assessment

## 2022-10-23 NOTE — Anesthesia Preprocedure Evaluation (Addendum)
Anesthesia Evaluation  Patient identified by MRN, date of birth, ID band Patient awake    Reviewed: Allergy & Precautions, H&P , NPO status , Patient's Chart, lab work & pertinent test results  Airway Mallampati: II  TM Distance: >3 FB Neck ROM: full    Dental no notable dental hx.    Pulmonary asthma , former smoker   Pulmonary exam normal        Cardiovascular hypertension, Normal cardiovascular exam  H/o DVT R leg   Neuro/Psych  PSYCHIATRIC DISORDERS Anxiety Depression    negative neurological ROS     GI/Hepatic negative GI ROS,,,(+)     substance abuse (h/o cocain abuse, MJ)    Endo/Other    Morbid obesity  Renal/GU      Musculoskeletal   Abdominal  (+) + obese  Peds  Hematology Immune deficiency disorder Human immunodeficiency virus (HIV) disease- HIV (CD4 count 700-900, undetectable viral load, on Biktarvy)   Anesthesia Other Findings Past Medical History: No date: Asthma No date: DVT (deep venous thrombosis) (HCC)     Comment:  right leg No date: Hypertension No date: Immune deficiency disorder (HCC)  Past Surgical History: No date: ABDOMINAL HYSTERECTOMY No date: CHOLECYSTECTOMY No date: HAND SURGERY 2014: HERNIA REPAIR     Comment:  ventral 12/06/2016: KNEE CLOSED REDUCTION; Right     Comment:  Procedure: CLOSED MANIPULATION KNEE;  Surgeon: Kennedy Bucker, MD;  Location: [facility] ORS;  Service: Orthopedics;               Laterality: Right; No date: KNEE SURGERY No date: ORIF FINGER FRACTURE; Left     Comment:  middle finger, metal placed No date: TUBAL LIGATION     Reproductive/Obstetrics negative OB ROS                              Anesthesia Physical Anesthesia Plan  ASA: 2  Anesthesia Plan: General ETT   Post-op Pain Management:    Induction: Rapid sequence and Intravenous  PONV Risk Score and Plan: 2 and Ondansetron, Dexamethasone and  Midazolam  Airway Management Planned: Oral ETT  Additional Equipment:   Intra-op Plan:   Post-operative Plan: Extubation in OR  Informed Consent: I have reviewed the patients History and Physical, chart, labs and discussed the procedure including the risks, benefits and alternatives for the proposed anesthesia with the patient or authorized representative who has indicated his/her understanding and acceptance.     Dental Advisory Given  Plan Discussed with: CRNA and Surgeon  Anesthesia Plan Comments:          Anesthesia Quick Evaluation

## 2022-10-23 NOTE — ED Triage Notes (Signed)
Patient to ED for abscess on buttock. Patient states it came up over night. Unsure if she has been straining.

## 2022-10-23 NOTE — H&P (Signed)
SURGICAL CONSULTATION NOTE   HISTORY OF PRESENT ILLNESS (HPI):  52 y.o. female presented to [facility] ED for evaluation of perianal pain. Patient reports she has been having severe perianal pain the last few days.  She endorses that the pain is increasing in intensity.  Pain localized to the perianal area.  No pain radiation.  Pain aggravated by movement or applying pressure.  No alleviating factors.  Denies any drainage.  Denies any fever.  At the ED she was found with significant tenderness in the perianal area.  Labs shows no leukocytosis.  No significant electrolyte disturbance.  She had a CT scan of the pelvis showing large posterior rectal abscess.  I personally evaluated the images.  Surgery is consulted by Poggi, PA in this context for evaluation and management of rectal abscess.  PAST MEDICAL HISTORY (PMH):  Past Medical History:  Diagnosis Date   Asthma    DVT (deep venous thrombosis) (HCC)    right leg   Hypertension    Immune deficiency disorder (HCC)      PAST SURGICAL HISTORY (PSH):  Past Surgical History:  Procedure Laterality Date   ABDOMINAL HYSTERECTOMY     CHOLECYSTECTOMY     HAND SURGERY     HERNIA REPAIR  2014   ventral   KNEE CLOSED REDUCTION Right 12/06/2016   Procedure: CLOSED MANIPULATION KNEE;  Surgeon: Kennedy Bucker, MD;  Location: [facility] ORS;  Service: Orthopedics;  Laterality: Right;   KNEE SURGERY     ORIF FINGER FRACTURE Left    middle finger, metal placed   TUBAL LIGATION       MEDICATIONS:  Prior to Admission medications   Medication Sig Start Date End Date Taking? Authorizing Provider  acetaminophen (TYLENOL) 500 MG tablet Take 1,000 mg by mouth daily as needed for moderate pain or headache.    [provider]  albuterol (VENTOLIN HFA) 108 (90 Base) MCG/ACT inhaler Inhale 2 puffs into the lungs every 6 (six) hours as needed for wheezing or shortness of breath.  10/03/15   [provider]  aspirin EC 81 MG tablet Take 81 mg by mouth  daily.     [provider]  Aspirin-Salicylamide-Caffeine (ARTHRITIS STRENGTH BC POWDER PO) Take 1 packet by mouth daily as needed (pain).    [provider]  bictegravir-emtricitabine-tenofovir AF (BIKTARVY) 50-200-25 MG TABS tablet Take 1 tablet by mouth daily. 12/07/19   [provider]  diazepam (VALIUM) 5 MG tablet Take 5 mg by mouth every 8 (eight) hours as needed. Patient not taking: Reported on 11/22/2020 02/28/20   [provider]  diphenhydrAMINE (BENADRYL) 25 MG tablet Take 25 mg by mouth daily as needed for allergies.    [provider]  docusate sodium (COLACE) 100 MG capsule Take 1 capsule (100 mg total) by mouth 2 (two) times daily. Patient taking differently: Take 100 mg by mouth daily.  11/14/15   Katharina Caper, MD  dolutegravir (TIVICAY) 50 MG tablet Take 50 mg by mouth daily. 05/16/15   [provider]  emtricitabine-tenofovir AF (DESCOVY) 200-25 MG tablet Take 1 tablet by mouth daily.    [provider]  enalapril (VASOTEC) 10 MG tablet Take 10 mg by mouth daily.    [provider]  esomeprazole (NEXIUM) 40 MG capsule Take 40 mg by mouth 2 (two) times daily. 01/06/20   [provider]  gabapentin (NEURONTIN) 300 MG capsule Take 1 capsule by mouth daily. Patient not taking: Reported on 11/22/2020 01/15/19   [provider]  HYDROcodone-acetaminophen (NORCO/VICODIN) 5-325 MG tablet Take 1 tablet by mouth every 4 (four) hours as needed for moderate pain. 09/09/22 09/09/23  Cuthriell, Delorise Royals, PA-C  ketorolac (TORADOL) 10 MG tablet Take 1 tablet (10 mg total) by mouth every 8 (eight) hours as needed for moderate pain. 06/01/17   Tommi Rumps, PA-C  Lidocaine 0.5 % GEL Apply 1 application topically 3 (three) times daily. 09/28/17   Enid Derry, PA-C  meloxicam (MOBIC) 15 MG tablet Take 1 tablet (15 mg total) by mouth daily. 01/07/22 01/07/23  Tommi Rumps, PA-C  Menthol, Topical Analgesic,  (ICY HOT EX) Apply 1 application topically daily as needed (pain).    [provider]  oxyCODONE-acetaminophen (PERCOCET/ROXICET) 5-325 MG tablet Take 1 tablet by mouth every 6 (six) hours as needed for severe pain. 07/13/17   Joni Reining, PA-C  predniSONE (DELTASONE) 10 MG tablet Take 1 tablet (10 mg total) by mouth daily. 6,5,4,3,2,1 six day taper 12/13/21   Evon Slack, PA-C  QVAR REDIHALER 40 MCG/ACT inhaler SMARTSIG:2 Puff(s) By Mouth Twice Daily 02/04/20   [provider]  sertraline (ZOLOFT) 50 MG tablet Take 1/2 pill by mouth daily for 1 week then increase to 1 pill daily Patient not taking: Reported on 11/22/2020 01/27/20   [provider]  sulfamethoxazole-trimethoprim (BACTRIM DS) 800-160 MG tablet Take 1 tablet by mouth 2 (two) times daily. 09/09/22   Cuthriell, Delorise Royals, PA-C  valACYclovir (VALTREX) 500 MG tablet Take 500 mg by mouth 2 (two) times daily. 10/03/15   [provider]     ALLERGIES:  Allergies  Allergen Reactions   Penicillins Hives and Swelling    Has patient had a PCN reaction causing immediate rash, facial/tongue/throat swelling, SOB or lightheadedness with hypotension: Yes Has patient had a PCN reaction causing severe rash involving mucus membranes or skin necrosis: No Has patient had a PCN reaction that required hospitalization No Has patient had a PCN reaction occurring within the last 10 years: No If all of the above answers are "NO", then may proceed with Cephalosporin use.   Tramadol Rash     SOCIAL HISTORY:  Social History   Socioeconomic History   Marital status: Single    Spouse name: Not on file   Number of children: Not on file   Years of education: Not on file   Highest education level: Not on file  Occupational History   Not on file  Tobacco Use   Smoking status: Former    Packs/day: .25    Types: Cigarettes    Quit date: 09/15/2016    Years since quitting: 6.1   Smokeless tobacco: Never  Vaping  Use   Vaping Use: Never used  Substance and Sexual Activity   Alcohol use: No   Drug use: Not Currently    Types: Marijuana, Cocaine    Comment: per patient, last use was years ago of either substance   Sexual activity: Yes    Partners: Male    Birth control/protection: Surgical  Other Topics Concern   Not on file  Social History Narrative   Not on file   Social Determinants of Health   Financial Resource Strain: Low Risk  (09/10/2022)   Overall Financial Resource Strain (CARDIA)    Difficulty of Paying Living Expenses: Not very hard  Food Insecurity: No Food Insecurity (09/10/2022)   Hunger Vital Sign    Worried About Running Out of Food in the Last Year: Never true    Ran Out of  Food in the Last Year: Never true  Transportation Needs: No Transportation Needs (09/10/2022)   PRAPARE - Administrator, Civil Service (Medical): No    Lack of Transportation (Non-Medical): No  Physical Activity: Not on file  Stress: Not on file  Social Connections: Not on file  Intimate Partner Violence: Not on file      FAMILY HISTORY:  Family History  Problem Relation Age of Onset   Heart disease Mother      REVIEW OF SYSTEMS:  Constitutional: denies weight loss, fever, chills, or sweats  Eyes: denies any other vision changes, history of eye injury  ENT: denies sore throat, hearing problems  Respiratory: denies shortness of breath, wheezing  Cardiovascular: denies chest pain, palpitations  Gastrointestinal: Denies abdominal pain, nausea and vomiting Genitourinary: denies burning with urination or urinary frequency Musculoskeletal: denies any other joint pains or cramps  Skin: denies any other rashes or skin discolorations  Neurological: denies any other headache, dizziness, weakness  Psychiatric: denies any other depression, anxiety   All other review of systems were negative   VITAL SIGNS:  Temp:  [98.8 F (37.1 C)] 98.8 F (37.1 C) (05/07 0809) Pulse Rate:  [73] 73  (05/07 0809) Resp:  [18] 18 (05/07 0809) BP: (158)/(112) 158/112 (05/07 0809) SpO2:  [94 %] 94 % (05/07 0809)             INTAKE/OUTPUT:  This shift: No intake/output data recorded.  Last 2 shifts: @IOLAST2SHIFTS @   PHYSICAL EXAM:  Constitutional:  -- Normal body habitus  -- Awake, alert, and oriented x3  Eyes:  -- Pupils equally round and reactive to light  -- No scleral icterus  Ear, nose, and throat:  -- No jugular venous distension  Pulmonary:  -- No crackles  -- Equal breath sounds bilaterally -- Breathing non-labored at rest Cardiovascular:  -- S1, S2 present  -- No pericardial rubs Gastrointestinal:  -- Abdomen soft, nontender, non-distended, no guarding or rebound tenderness -- No abdominal masses appreciated, pulsatile or otherwise  --, Exam with severe tenderness on the perianal area.  No drainage.  Bulging on the posterior Anacal. Musculoskeletal and Integumentary:  -- Wounds: None appreciated -- Extremities: B/L UE and LE FROM, hands and feet warm, no edema  Neurologic:  -- Motor function: intact and symmetric -- Sensation: intact and symmetric   Labs:     Latest Ref Rng & Units 10/23/2022    8:30 AM 09/09/2022    2:49 PM 04/26/2018    6:19 PM  CBC  WBC 4.0 - 10.5 K/uL 7.5  4.2  6.4   Hemoglobin 12.0 - 15.0 g/dL 16.1  09.6  04.5   Hematocrit 36.0 - 46.0 % 43.0  42.9  45.0   Platelets 150 - 400 K/uL 190  208  217       Latest Ref Rng & Units 10/23/2022    8:30 AM 09/09/2022    2:49 PM 04/26/2018    6:19 PM  CMP  Glucose 70 - 99 mg/dL 409  811  94   BUN 6 - 20 mg/dL 22  21  17    Creatinine 0.44 - 1.00 mg/dL 9.14  7.82  9.56   Sodium 135 - 145 mmol/L 140  136  137   Potassium 3.5 - 5.1 mmol/L 3.5  3.6  3.5   Chloride 98 - 111 mmol/L 108  110  104   CO2 22 - 32 mmol/L 24  23  24    Calcium 8.9 - 10.3  mg/dL 8.5  8.5  9.1     Imaging studies:  EXAM: CT PELVIS WITH CONTRAST   TECHNIQUE: Multidetector CT imaging of the pelvis was performed using  the standard protocol following the bolus administration of intravenous contrast.   RADIATION DOSE REDUCTION: This exam was performed according to the departmental dose-optimization program which includes automated exposure control, adjustment of the mA and/or kV according to patient size and/or use of iterative reconstruction technique.   CONTRAST:  OMNIPAQUE IOHEXOL 300 MG/ML  SOLN   COMPARISON:  CT 01/12/2012   FINDINGS: Urinary Tract:  Preserved contours of the urinary bladder.   Bowel: Visualized colon is nondilated. Few sigmoid colon diverticula. Normal appendix extends inferior to the cecum in the right hemipelvis. The visualized small bowel is nondilated.   There is a rim enhancing oblong fluid collection measuring longitudinal length of 4.6 cm. Transverse dimension 1.4 by 1.3 cm. This has best seen on sagittal series 10, image 114. This is centered immediately posterior to the course of the anal canal along the left gluteal cleft with severe adjacent inflammatory stranding. No gas. This is consistent with a perianal abscess. Questionable fistula track on this examination to the margin of the anal canal but difficult to identified in detail. No extension of fluid above the pelvic floor musculature or along the ischioanal fossa otherwise.   Vascular/Lymphatic: Preserved central iliac vessels. No discrete abnormal lymph node enlargement in the pelvis.   Reproductive:  Absence of the uterus.  No separate adnexal mass.   Other:  No free fluid in the pelvis.   Musculoskeletal: Scattered degenerative changes along the spine and pelvis. Trace anterolisthesis of L4 on L5. Sclerosis along both sacroiliac joints, left-greater-than-right.   IMPRESSION: 4.6 x 1.4 x 1.3 oblong rim enhancing fluid collection with severe adjacent inflammatory stranding centered posterior to the course of the anal canal. Again findings consistent with a perianal abscess. Tract to the anal  canal is not well defined on this examination. If there is further concern for the anatomy of the potential fistula, dedicated anal fistula MRI with and without contrast may be of some benefit fistula     Electronically Signed   By: Karen Kays M.D.   On: 10/23/2022 10:20  Assessment/Plan:  52 y.o. female with rectal abscess, complicated by pertinent comorbidities including hypertension, HIV, hepatitis C.  Patient with recurrent large perirectal abscess.  Significant tenderness to palpation.  Discussed with patient finding on physical exam and imaging.  Recommended incision and drainage of the perirectal abscess.  Discussed with patient the risk of surgery including bleeding, infection, recurrent abscess, pain, among others.  The patient reports she understood and agreed to proceed.  Gae Gallop, MD

## 2022-10-23 NOTE — Transfer of Care (Signed)
Immediate Anesthesia Transfer of Care Note  Patient: Diane Castillo  Procedure(s) Performed: INCISION AND DRAINAGE RECTAL ABSCESS  Patient Location: PACU  Anesthesia Type:General  Level of Consciousness: drowsy  Airway & Oxygen Therapy: Patient Spontanous Breathing and Patient connected to face mask oxygen  Post-op Assessment: Report given to RN and Post -op Vital signs reviewed and stable  Post vital signs: Reviewed  Last Vitals:  Vitals Value Taken Time  BP 159/99 10/23/22 1830  Temp 36.4 C 10/23/22 1830  Pulse 63 10/23/22 1832  Resp 8 10/23/22 1832  SpO2 100 % 10/23/22 1832  Vitals shown include unvalidated device data.  Last Pain:  Vitals:   10/23/22 1830  TempSrc:   PainSc: 0-No pain         Complications: No notable events documented.

## 2022-10-23 NOTE — Discharge Summary (Signed)
Patient ID: Diane Castillo MRN: [id number] DOB/AGE: [birth year] 52 y.o.  Admit date: 10/23/2022 Discharge date: 10/23/2022   Discharge Diagnoses:  Principal Problem:   Rectal abscess   Procedures:Drainage of rectal abscess  Hospital Course: Patient with rectal abscess. She had incision and drainage. Tolerated procedure well. No other complication. Will discharge with oral antibiotics and pain management.   Physical Exam Vitals reviewed.  HENT:     Head: Normocephalic.     Nose: Nose normal.  Cardiovascular:     Rate and Rhythm: Normal rate.     Pulses: Normal pulses.  Pulmonary:     Effort: Pulmonary effort is normal.  Abdominal:     General: Abdomen is flat.  Musculoskeletal:     Cervical back: Normal range of motion.  Neurological:     Mental Status: She is alert.   Rectal: small wound packing.    Consults: None  Disposition: Discharge disposition: 01-Home or Self Care       Discharge Instructions     Diet - low sodium heart healthy   Complete by: As directed    Diet - low sodium heart healthy   Complete by: As directed    Increase activity slowly   Complete by: As directed    Increase activity slowly   Complete by: As directed       Allergies as of 10/23/2022       Reactions   Penicillins Hives, Swelling   Has patient had a PCN reaction causing immediate rash, facial/tongue/throat swelling, SOB or lightheadedness with hypotension: Yes Has patient had a PCN reaction causing severe rash involving mucus membranes or skin necrosis: No Has patient had a PCN reaction that required hospitalization No Has patient had a PCN reaction occurring within the last 10 years: No If all of the above answers are "NO", then may proceed with Cephalosporin use.   Tramadol Rash        Medication List     TAKE these medications    acetaminophen 500 MG tablet Commonly known as: TYLENOL Take 1,000 mg by mouth daily as needed for moderate pain or headache.    albuterol 108 (90 Base) MCG/ACT inhaler Commonly known as: VENTOLIN HFA Inhale 2 puffs into the lungs every 6 (six) hours as needed for wheezing or shortness of breath.   ARTHRITIS STRENGTH BC POWDER PO Take 1 packet by mouth daily as needed (pain).   aspirin EC 81 MG tablet Take 81 mg by mouth daily.   Biktarvy 50-200-25 MG Tabs tablet Generic drug: bictegravir-emtricitabine-tenofovir AF Take 1 tablet by mouth daily.   ciprofloxacin 500 MG tablet Commonly known as: Cipro Take 1 tablet (500 mg total) by mouth 2 (two) times daily for 10 days.   Descovy 200-25 MG tablet Generic drug: emtricitabine-tenofovir AF Take 1 tablet by mouth daily.   diazepam 5 MG tablet Commonly known as: VALIUM Take 5 mg by mouth every 8 (eight) hours as needed.   diphenhydrAMINE 25 MG tablet Commonly known as: BENADRYL Take 25 mg by mouth daily as needed for allergies.   docusate sodium 100 MG capsule Commonly known as: COLACE Take 1 capsule (100 mg total) by mouth 2 (two) times daily. What changed: when to take this   enalapril 10 MG tablet Commonly known as: VASOTEC Take 10 mg by mouth daily.   esomeprazole 40 MG capsule Commonly known as: NEXIUM Take 40 mg by mouth 2 (two) times daily.   gabapentin 300 MG capsule Commonly known as: NEURONTIN Take 1  capsule by mouth daily.   HYDROcodone-acetaminophen 5-325 MG tablet Commonly known as: NORCO/VICODIN Take 1 tablet by mouth every 4 (four) hours as needed for moderate pain. What changed: Another medication with the same name was added. Make sure you understand how and when to take each.   HYDROcodone-acetaminophen 5-325 MG tablet Commonly known as: Norco Take 1 tablet by mouth every 4 (four) hours as needed for up to 3 days for moderate pain. What changed: You were already taking a medication with the same name, and this prescription was added. Make sure you understand how and when to take each.   ICY HOT EX Apply 1 application  topically daily as needed (pain).   ketorolac 10 MG tablet Commonly known as: TORADOL Take 1 tablet (10 mg total) by mouth every 8 (eight) hours as needed for moderate pain.   Lidocaine 0.5 % Gel Apply 1 application topically 3 (three) times daily.   losartan 50 MG tablet Commonly known as: COZAAR Take 50 mg by mouth daily.   meloxicam 15 MG tablet Commonly known as: MOBIC Take 1 tablet (15 mg total) by mouth daily.   metroNIDAZOLE 500 MG tablet Commonly known as: Flagyl Take 1 tablet (500 mg total) by mouth 3 (three) times daily for 10 days.   oxyCODONE-acetaminophen 5-325 MG tablet Commonly known as: PERCOCET/ROXICET Take 1 tablet by mouth every 6 (six) hours as needed for severe pain.   predniSONE 10 MG tablet Commonly known as: DELTASONE Take 1 tablet (10 mg total) by mouth daily. 6,5,4,3,2,1 six day taper   Qvar RediHaler 40 MCG/ACT inhaler Generic drug: beclomethasone SMARTSIG:2 Puff(s) By Mouth Twice Daily   sertraline 50 MG tablet Commonly known as: ZOLOFT Take 1/2 pill by mouth daily for 1 week then increase to 1 pill daily   sulfamethoxazole-trimethoprim 800-160 MG tablet Commonly known as: BACTRIM DS Take 1 tablet by mouth 2 (two) times daily.   Tivicay 50 MG tablet Generic drug: dolutegravir Take 50 mg by mouth daily.   valACYclovir 500 MG tablet Commonly known as: VALTREX Take 500 mg by mouth 2 (two) times daily.        Follow-up Information     Carolan Shiver, MD Follow up in 1 week(s).   Specialty: General Surgery Why: Follow up after rectal abscess drainage  Please call in the morning to schedule follow up Contact information: [address] [place] [address] [phone]

## 2022-10-23 NOTE — ED Provider Notes (Signed)
[facility] Provider Note    Event Date/Time   First MD Initiated Contact with Patient 10/23/22 [phone]     (approximate)   History   Abscess   HPI  Diane Castillo is a 52 y.o. female with a past medical history of hypertension, asthma, anxiety, obesity, HIV (CD4 count 700-900, undetectable viral load, on Biktarvy), hepatitis C who presents today for evaluation of buttock pain.  Patient reports that this occurred over the last couple of days.  She denies fevers or chills.  She reports that it hurts whenever she tries new move her bowels.  She reports that she is unable to sit on this area.  She has not had this before.  She has not noticed any drainage here.  Patient Active Problem List   Diagnosis Date Noted   H/O: hysterectomy 11/22/2020   Anxiety 01/27/2020   BMI 40.0-44.9, adult (HCC); 186 lbs 12/22/2018   Chronic hepatitis C without hepatic coma (HCC) 03/18/2017   Primary localized osteoarthritis of right knee 10/30/2016   Rupture of medial head of right gastrocnemius 11/15/2015   Right leg pain 11/14/2015   Gastrocnemius muscle strain 11/14/2015   Hypokalemia 11/14/2015   Metabolic acidosis 11/14/2015   Hyperglycemia 11/14/2015   Drug abuse (HCC) cocaine, MJ 11/14/2015   Rhabdomyolysis 11/13/2015   HTN (hypertension) 09/06/2014   Asthma 04/24/2010   Iron deficiency anemia 04/24/2010   Personal history of venous thrombosis and embolism 04/24/2010   Major depressive disorder, recurrent episode, moderate (HCC) 11/30/2009   Human immunodeficiency virus (HIV) disease (HCC) 1996 09/30/2000          Physical Exam   Triage Vital Signs: ED Triage Vitals [10/23/22 0809]  Enc Vitals Group     BP (!) 158/112     Pulse Rate 73     Resp 18     Temp 98.8 F (37.1 C)     Temp Source Oral     SpO2 94 %     Weight      Height      Head Circumference      Peak Flow      Pain Score 10     Pain Loc      Pain Edu?      Excl. in GC?     Most  recent vital signs: Vitals:   10/23/22 0900 10/23/22 1000  BP: (!) 145/100 (!) 145/92  Pulse:  77  Resp:  20  Temp:    SpO2:  98%    Physical Exam Vitals and nursing note reviewed.  Constitutional:      General: Awake and alert. No acute distress.    Appearance: Normal appearance. The patient is obese.  HENT:     Head: Normocephalic and atraumatic.     Mouth: Mucous membranes are moist.  Eyes:     General: PERRL. Normal EOMs        Right eye: No discharge.        Left eye: No discharge.     Conjunctiva/sclera: Conjunctivae normal.  Cardiovascular:     Rate and Rhythm: Normal rate and regular rhythm.     Pulses: Normal pulses.  Pulmonary:     Effort: Pulmonary effort is normal. No respiratory distress.     Breath sounds: Normal breath sounds.  Abdominal:     Abdomen is soft. There is no abdominal tenderness. No rebound or guarding. No distention. Left buttock at the 10 o'clock position adjacent to the anus with area  of fluctuance and tenderness.  No open wounds.  No overlying erythema. No crepitus.  It is significantly tender to palpation Musculoskeletal:        General: No swelling. Normal range of motion.     Cervical back: Normal range of motion and neck supple.  Skin:    General: Skin is warm and dry.     Capillary Refill: Capillary refill takes less than 2 seconds.     Findings: No rash.  Neurological:     Mental Status: The patient is awake and alert.      ED Results / Procedures / Treatments   Labs (all labs ordered are listed, but only abnormal results are displayed) Labs Reviewed  BASIC METABOLIC PANEL - Abnormal; Notable for the following components:      Result Value   Glucose, Bld 107 (*)    BUN 22 (*)    Calcium 8.5 (*)    All other components within normal limits  CBC WITH DIFFERENTIAL/PLATELET     EKG     RADIOLOGY I independently reviewed and interpreted imaging and agree with radiologists findings.     PROCEDURES:  Critical Care  performed:   Procedures   MEDICATIONS ORDERED IN ED: Medications  metroNIDAZOLE (FLAGYL) IVPB 500 mg (has no administration in time range)    And  ciprofloxacin (CIPRO) IVPB 400 mg (has no administration in time range)  ketorolac (TORADOL) 15 MG/ML injection 15 mg (15 mg Intravenous Given 10/23/22 0840)  morphine (PF) 4 MG/ML injection 4 mg (4 mg Intravenous Given 10/23/22 1002)  iohexol (OMNIPAQUE) 300 MG/ML solution 100 mL (100 mLs Intravenous Contrast Given 10/23/22 0943)  morphine (PF) 4 MG/ML injection 4 mg (4 mg Intravenous Given 10/23/22 1334)     IMPRESSION / MDM / ASSESSMENT AND PLAN / ED COURSE  I reviewed the triage vital signs and the nursing notes.   Differential diagnosis includes, but is not limited to, perianal abscess, perirectal abscess, pilonidal abscess, hemorrhoid.  Patient is awake and alert, hemodynamically stable and afebrile.  Patient is immunocompromised with HIV, though she is compliant with her antiretrovirals.  I am unable to ascertain on physical exam if this is a perianal or perirectal abscess, therefore patient agreed to further workup.  IV was established and labs were obtained.  Patient required multiple rounds of IV pain medication.  There is no leukocytosis.  CT scan obtained reveals a 4.6 x 1.4 x 1.3 oblong rim-enhancing fluid collection with severe adjacent inflammatory stranding along the anal canal consistent with a perianal abscess.  Given the location and size of the abscess along with her comorbidities I discussed with general surgery, Dr. Gorden Harms.  Dr. Sabino Niemann came to evaluate the patient and recommends incision and drainage of the abscess to be done in the operating room.  Patient had unfortunately just had a large meal, so must wait 6 hours prior to the surgery.  Patient agrees for plan of surgery.  She was started on Cipro and Flagyl.   Patient's presentation is most consistent with acute presentation with potential threat to life or bodily  function.   Clinical Course as of 10/23/22 1442  Tue Oct 23, 2022  1033 Secure chat sent to Dr. Maia Plan for bedside vs OR drainage [JP]  1047 Discussed with Dr. Maia Plan on the telephone, he will come to evaluate the patient [JP]    Clinical Course User Index [JP] Renika Shiflet, Herb Grays, PA-C     FINAL CLINICAL IMPRESSION(S) / ED DIAGNOSES   Final diagnoses:  Perianal abscess     Rx / DC Orders   ED Discharge Orders     None        Note:  This document was prepared using Dragon voice recognition software and may include unintentional dictation errors.   Jackelyn Hoehn, PA-C 10/23/22 1442    Corena Herter, MD 10/26/22 [phone]

## 2022-10-23 NOTE — ED Notes (Signed)
Pt gone to preop at this time

## 2022-10-24 ENCOUNTER — Encounter: Payer: Self-pay | Admitting: General Surgery

## 2022-10-24 DIAGNOSIS — M170 Bilateral primary osteoarthritis of knee: Secondary | ICD-10-CM | POA: Diagnosis not present

## 2022-10-24 NOTE — Anesthesia Postprocedure Evaluation (Signed)
Anesthesia Post Note  Patient: SERENITEE HILLEGAS  Procedure(s) Performed: INCISION AND DRAINAGE RECTAL ABSCESS  Patient location during evaluation: PACU Anesthesia Type: General Level of consciousness: awake and alert Pain management: pain level controlled Vital Signs Assessment: post-procedure vital signs reviewed and stable Respiratory status: spontaneous breathing, nonlabored ventilation and respiratory function stable Cardiovascular status: blood pressure returned to baseline and stable Postop Assessment: no apparent nausea or vomiting Anesthetic complications: no   No notable events documented.   Last Vitals:  Vitals:   10/23/22 1900 10/23/22 1924  BP: (!) 132/91 (!) 153/96  Pulse: 61 60  Resp: 18   Temp: 36.7 C 36.8 C  SpO2: 98% 99%    Last Pain:  Vitals:   10/23/22 1924  TempSrc: Temporal  PainSc: 3                  Foye Deer

## 2022-10-25 DIAGNOSIS — M170 Bilateral primary osteoarthritis of knee: Secondary | ICD-10-CM | POA: Diagnosis not present

## 2022-10-25 LAB — AEROBIC/ANAEROBIC CULTURE W GRAM STAIN (SURGICAL/DEEP WOUND)

## 2022-10-26 DIAGNOSIS — M170 Bilateral primary osteoarthritis of knee: Secondary | ICD-10-CM | POA: Diagnosis not present

## 2022-10-27 DIAGNOSIS — M170 Bilateral primary osteoarthritis of knee: Secondary | ICD-10-CM | POA: Diagnosis not present

## 2022-10-28 DIAGNOSIS — M170 Bilateral primary osteoarthritis of knee: Secondary | ICD-10-CM | POA: Diagnosis not present

## 2022-10-28 LAB — AEROBIC/ANAEROBIC CULTURE W GRAM STAIN (SURGICAL/DEEP WOUND)

## 2022-10-29 DIAGNOSIS — M170 Bilateral primary osteoarthritis of knee: Secondary | ICD-10-CM | POA: Diagnosis not present

## 2022-10-30 DIAGNOSIS — M170 Bilateral primary osteoarthritis of knee: Secondary | ICD-10-CM | POA: Diagnosis not present

## 2022-10-31 DIAGNOSIS — M170 Bilateral primary osteoarthritis of knee: Secondary | ICD-10-CM | POA: Diagnosis not present

## 2022-11-01 ENCOUNTER — Encounter: Payer: Medicaid Other | Admitting: Family Medicine

## 2022-11-01 DIAGNOSIS — M170 Bilateral primary osteoarthritis of knee: Secondary | ICD-10-CM | POA: Diagnosis not present

## 2022-11-02 DIAGNOSIS — M170 Bilateral primary osteoarthritis of knee: Secondary | ICD-10-CM | POA: Diagnosis not present

## 2022-11-03 DIAGNOSIS — M170 Bilateral primary osteoarthritis of knee: Secondary | ICD-10-CM | POA: Diagnosis not present

## 2022-11-04 DIAGNOSIS — M170 Bilateral primary osteoarthritis of knee: Secondary | ICD-10-CM | POA: Diagnosis not present

## 2022-11-05 DIAGNOSIS — M170 Bilateral primary osteoarthritis of knee: Secondary | ICD-10-CM | POA: Diagnosis not present

## 2022-11-06 DIAGNOSIS — M170 Bilateral primary osteoarthritis of knee: Secondary | ICD-10-CM | POA: Diagnosis not present

## 2022-11-07 DIAGNOSIS — M170 Bilateral primary osteoarthritis of knee: Secondary | ICD-10-CM | POA: Diagnosis not present

## 2022-11-08 DIAGNOSIS — M170 Bilateral primary osteoarthritis of knee: Secondary | ICD-10-CM | POA: Diagnosis not present

## 2022-11-09 DIAGNOSIS — M170 Bilateral primary osteoarthritis of knee: Secondary | ICD-10-CM | POA: Diagnosis not present

## 2022-11-10 DIAGNOSIS — M170 Bilateral primary osteoarthritis of knee: Secondary | ICD-10-CM | POA: Diagnosis not present

## 2022-11-11 DIAGNOSIS — M170 Bilateral primary osteoarthritis of knee: Secondary | ICD-10-CM | POA: Diagnosis not present

## 2022-11-12 DIAGNOSIS — M170 Bilateral primary osteoarthritis of knee: Secondary | ICD-10-CM | POA: Diagnosis not present

## 2022-11-13 DIAGNOSIS — M170 Bilateral primary osteoarthritis of knee: Secondary | ICD-10-CM | POA: Diagnosis not present

## 2022-11-14 DIAGNOSIS — M170 Bilateral primary osteoarthritis of knee: Secondary | ICD-10-CM | POA: Diagnosis not present

## 2022-11-15 DIAGNOSIS — M170 Bilateral primary osteoarthritis of knee: Secondary | ICD-10-CM | POA: Diagnosis not present

## 2022-11-16 DIAGNOSIS — M170 Bilateral primary osteoarthritis of knee: Secondary | ICD-10-CM | POA: Diagnosis not present

## 2022-11-17 DIAGNOSIS — M170 Bilateral primary osteoarthritis of knee: Secondary | ICD-10-CM | POA: Diagnosis not present

## 2022-11-18 DIAGNOSIS — M170 Bilateral primary osteoarthritis of knee: Secondary | ICD-10-CM | POA: Diagnosis not present

## 2022-11-19 DIAGNOSIS — M170 Bilateral primary osteoarthritis of knee: Secondary | ICD-10-CM | POA: Diagnosis not present

## 2022-11-20 DIAGNOSIS — M170 Bilateral primary osteoarthritis of knee: Secondary | ICD-10-CM | POA: Diagnosis not present

## 2022-11-21 DIAGNOSIS — M170 Bilateral primary osteoarthritis of knee: Secondary | ICD-10-CM | POA: Diagnosis not present

## 2022-11-22 DIAGNOSIS — M170 Bilateral primary osteoarthritis of knee: Secondary | ICD-10-CM | POA: Diagnosis not present

## 2022-11-23 DIAGNOSIS — M170 Bilateral primary osteoarthritis of knee: Secondary | ICD-10-CM | POA: Diagnosis not present

## 2022-11-24 DIAGNOSIS — M170 Bilateral primary osteoarthritis of knee: Secondary | ICD-10-CM | POA: Diagnosis not present

## 2022-11-25 DIAGNOSIS — M170 Bilateral primary osteoarthritis of knee: Secondary | ICD-10-CM | POA: Diagnosis not present

## 2022-11-26 DIAGNOSIS — M170 Bilateral primary osteoarthritis of knee: Secondary | ICD-10-CM | POA: Diagnosis not present

## 2022-11-27 DIAGNOSIS — M170 Bilateral primary osteoarthritis of knee: Secondary | ICD-10-CM | POA: Diagnosis not present

## 2022-11-28 DIAGNOSIS — M170 Bilateral primary osteoarthritis of knee: Secondary | ICD-10-CM | POA: Diagnosis not present

## 2022-11-29 DIAGNOSIS — H524 Presbyopia: Secondary | ICD-10-CM | POA: Diagnosis not present

## 2022-11-29 DIAGNOSIS — H2513 Age-related nuclear cataract, bilateral: Secondary | ICD-10-CM | POA: Diagnosis not present

## 2022-11-29 DIAGNOSIS — M170 Bilateral primary osteoarthritis of knee: Secondary | ICD-10-CM | POA: Diagnosis not present

## 2022-11-29 DIAGNOSIS — H52223 Regular astigmatism, bilateral: Secondary | ICD-10-CM | POA: Diagnosis not present

## 2022-11-29 NOTE — Progress Notes (Signed)
Erroneous - disregard °

## 2022-11-30 DIAGNOSIS — M170 Bilateral primary osteoarthritis of knee: Secondary | ICD-10-CM | POA: Diagnosis not present

## 2022-12-01 DIAGNOSIS — M170 Bilateral primary osteoarthritis of knee: Secondary | ICD-10-CM | POA: Diagnosis not present

## 2022-12-02 DIAGNOSIS — M170 Bilateral primary osteoarthritis of knee: Secondary | ICD-10-CM | POA: Diagnosis not present

## 2022-12-03 DIAGNOSIS — M170 Bilateral primary osteoarthritis of knee: Secondary | ICD-10-CM | POA: Diagnosis not present

## 2022-12-04 DIAGNOSIS — M170 Bilateral primary osteoarthritis of knee: Secondary | ICD-10-CM | POA: Diagnosis not present

## 2022-12-05 DIAGNOSIS — M170 Bilateral primary osteoarthritis of knee: Secondary | ICD-10-CM | POA: Diagnosis not present

## 2022-12-06 DIAGNOSIS — M170 Bilateral primary osteoarthritis of knee: Secondary | ICD-10-CM | POA: Diagnosis not present

## 2022-12-07 DIAGNOSIS — M170 Bilateral primary osteoarthritis of knee: Secondary | ICD-10-CM | POA: Diagnosis not present

## 2022-12-08 DIAGNOSIS — M170 Bilateral primary osteoarthritis of knee: Secondary | ICD-10-CM | POA: Diagnosis not present

## 2022-12-09 DIAGNOSIS — M170 Bilateral primary osteoarthritis of knee: Secondary | ICD-10-CM | POA: Diagnosis not present

## 2022-12-10 DIAGNOSIS — M170 Bilateral primary osteoarthritis of knee: Secondary | ICD-10-CM | POA: Diagnosis not present

## 2022-12-11 DIAGNOSIS — M170 Bilateral primary osteoarthritis of knee: Secondary | ICD-10-CM | POA: Diagnosis not present

## 2022-12-12 DIAGNOSIS — M170 Bilateral primary osteoarthritis of knee: Secondary | ICD-10-CM | POA: Diagnosis not present

## 2022-12-13 DIAGNOSIS — M170 Bilateral primary osteoarthritis of knee: Secondary | ICD-10-CM | POA: Diagnosis not present

## 2022-12-14 DIAGNOSIS — M170 Bilateral primary osteoarthritis of knee: Secondary | ICD-10-CM | POA: Diagnosis not present

## 2022-12-15 DIAGNOSIS — M170 Bilateral primary osteoarthritis of knee: Secondary | ICD-10-CM | POA: Diagnosis not present

## 2022-12-16 DIAGNOSIS — M170 Bilateral primary osteoarthritis of knee: Secondary | ICD-10-CM | POA: Diagnosis not present

## 2022-12-17 DIAGNOSIS — M170 Bilateral primary osteoarthritis of knee: Secondary | ICD-10-CM | POA: Diagnosis not present

## 2022-12-18 DIAGNOSIS — M170 Bilateral primary osteoarthritis of knee: Secondary | ICD-10-CM | POA: Diagnosis not present

## 2022-12-19 DIAGNOSIS — M170 Bilateral primary osteoarthritis of knee: Secondary | ICD-10-CM | POA: Diagnosis not present

## 2022-12-20 DIAGNOSIS — M170 Bilateral primary osteoarthritis of knee: Secondary | ICD-10-CM | POA: Diagnosis not present

## 2022-12-21 DIAGNOSIS — M170 Bilateral primary osteoarthritis of knee: Secondary | ICD-10-CM | POA: Diagnosis not present

## 2022-12-22 DIAGNOSIS — M170 Bilateral primary osteoarthritis of knee: Secondary | ICD-10-CM | POA: Diagnosis not present

## 2022-12-23 DIAGNOSIS — M170 Bilateral primary osteoarthritis of knee: Secondary | ICD-10-CM | POA: Diagnosis not present

## 2022-12-24 DIAGNOSIS — M170 Bilateral primary osteoarthritis of knee: Secondary | ICD-10-CM | POA: Diagnosis not present

## 2022-12-25 DIAGNOSIS — M170 Bilateral primary osteoarthritis of knee: Secondary | ICD-10-CM | POA: Diagnosis not present

## 2022-12-26 DIAGNOSIS — M170 Bilateral primary osteoarthritis of knee: Secondary | ICD-10-CM | POA: Diagnosis not present

## 2022-12-27 DIAGNOSIS — M170 Bilateral primary osteoarthritis of knee: Secondary | ICD-10-CM | POA: Diagnosis not present

## 2022-12-28 DIAGNOSIS — M170 Bilateral primary osteoarthritis of knee: Secondary | ICD-10-CM | POA: Diagnosis not present

## 2022-12-29 DIAGNOSIS — M170 Bilateral primary osteoarthritis of knee: Secondary | ICD-10-CM | POA: Diagnosis not present

## 2022-12-30 DIAGNOSIS — M170 Bilateral primary osteoarthritis of knee: Secondary | ICD-10-CM | POA: Diagnosis not present

## 2022-12-31 DIAGNOSIS — M170 Bilateral primary osteoarthritis of knee: Secondary | ICD-10-CM | POA: Diagnosis not present

## 2023-01-01 DIAGNOSIS — M170 Bilateral primary osteoarthritis of knee: Secondary | ICD-10-CM | POA: Diagnosis not present

## 2023-01-02 DIAGNOSIS — M170 Bilateral primary osteoarthritis of knee: Secondary | ICD-10-CM | POA: Diagnosis not present

## 2023-01-03 DIAGNOSIS — M170 Bilateral primary osteoarthritis of knee: Secondary | ICD-10-CM | POA: Diagnosis not present

## 2023-01-04 DIAGNOSIS — M170 Bilateral primary osteoarthritis of knee: Secondary | ICD-10-CM | POA: Diagnosis not present

## 2023-01-05 DIAGNOSIS — M170 Bilateral primary osteoarthritis of knee: Secondary | ICD-10-CM | POA: Diagnosis not present

## 2023-01-06 DIAGNOSIS — M170 Bilateral primary osteoarthritis of knee: Secondary | ICD-10-CM | POA: Diagnosis not present

## 2023-01-07 DIAGNOSIS — M170 Bilateral primary osteoarthritis of knee: Secondary | ICD-10-CM | POA: Diagnosis not present

## 2023-01-08 DIAGNOSIS — M170 Bilateral primary osteoarthritis of knee: Secondary | ICD-10-CM | POA: Diagnosis not present

## 2023-01-09 DIAGNOSIS — M170 Bilateral primary osteoarthritis of knee: Secondary | ICD-10-CM | POA: Diagnosis not present

## 2023-01-10 DIAGNOSIS — M170 Bilateral primary osteoarthritis of knee: Secondary | ICD-10-CM | POA: Diagnosis not present

## 2023-01-11 DIAGNOSIS — M170 Bilateral primary osteoarthritis of knee: Secondary | ICD-10-CM | POA: Diagnosis not present

## 2023-01-12 DIAGNOSIS — M170 Bilateral primary osteoarthritis of knee: Secondary | ICD-10-CM | POA: Diagnosis not present

## 2023-01-13 DIAGNOSIS — M170 Bilateral primary osteoarthritis of knee: Secondary | ICD-10-CM | POA: Diagnosis not present

## 2023-01-14 DIAGNOSIS — M170 Bilateral primary osteoarthritis of knee: Secondary | ICD-10-CM | POA: Diagnosis not present

## 2023-01-15 DIAGNOSIS — M170 Bilateral primary osteoarthritis of knee: Secondary | ICD-10-CM | POA: Diagnosis not present

## 2023-01-16 DIAGNOSIS — M170 Bilateral primary osteoarthritis of knee: Secondary | ICD-10-CM | POA: Diagnosis not present

## 2023-01-17 DIAGNOSIS — M170 Bilateral primary osteoarthritis of knee: Secondary | ICD-10-CM | POA: Diagnosis not present

## 2023-01-18 DIAGNOSIS — M170 Bilateral primary osteoarthritis of knee: Secondary | ICD-10-CM | POA: Diagnosis not present

## 2023-01-19 DIAGNOSIS — M170 Bilateral primary osteoarthritis of knee: Secondary | ICD-10-CM | POA: Diagnosis not present

## 2023-01-20 DIAGNOSIS — M170 Bilateral primary osteoarthritis of knee: Secondary | ICD-10-CM | POA: Diagnosis not present

## 2023-01-21 DIAGNOSIS — M170 Bilateral primary osteoarthritis of knee: Secondary | ICD-10-CM | POA: Diagnosis not present

## 2023-01-22 DIAGNOSIS — M170 Bilateral primary osteoarthritis of knee: Secondary | ICD-10-CM | POA: Diagnosis not present

## 2023-01-23 DIAGNOSIS — M170 Bilateral primary osteoarthritis of knee: Secondary | ICD-10-CM | POA: Diagnosis not present

## 2023-01-24 DIAGNOSIS — M170 Bilateral primary osteoarthritis of knee: Secondary | ICD-10-CM | POA: Diagnosis not present

## 2023-01-25 DIAGNOSIS — M170 Bilateral primary osteoarthritis of knee: Secondary | ICD-10-CM | POA: Diagnosis not present

## 2023-01-26 DIAGNOSIS — M170 Bilateral primary osteoarthritis of knee: Secondary | ICD-10-CM | POA: Diagnosis not present

## 2023-01-27 DIAGNOSIS — M170 Bilateral primary osteoarthritis of knee: Secondary | ICD-10-CM | POA: Diagnosis not present

## 2023-01-28 ENCOUNTER — Other Ambulatory Visit: Payer: Self-pay | Admitting: Orthopedic Surgery

## 2023-01-28 DIAGNOSIS — B20 Human immunodeficiency virus [HIV] disease: Secondary | ICD-10-CM | POA: Diagnosis not present

## 2023-01-28 DIAGNOSIS — M170 Bilateral primary osteoarthritis of knee: Secondary | ICD-10-CM | POA: Diagnosis not present

## 2023-01-28 DIAGNOSIS — Z792 Long term (current) use of antibiotics: Secondary | ICD-10-CM | POA: Diagnosis not present

## 2023-01-28 DIAGNOSIS — M25562 Pain in left knee: Secondary | ICD-10-CM

## 2023-01-28 DIAGNOSIS — M75102 Unspecified rotator cuff tear or rupture of left shoulder, not specified as traumatic: Secondary | ICD-10-CM

## 2023-01-28 DIAGNOSIS — Z113 Encounter for screening for infections with a predominantly sexual mode of transmission: Secondary | ICD-10-CM | POA: Diagnosis not present

## 2023-01-28 DIAGNOSIS — W19XXXA Unspecified fall, initial encounter: Secondary | ICD-10-CM

## 2023-01-28 DIAGNOSIS — Z9189 Other specified personal risk factors, not elsewhere classified: Secondary | ICD-10-CM | POA: Diagnosis not present

## 2023-01-29 DIAGNOSIS — M170 Bilateral primary osteoarthritis of knee: Secondary | ICD-10-CM | POA: Diagnosis not present

## 2023-01-30 DIAGNOSIS — M170 Bilateral primary osteoarthritis of knee: Secondary | ICD-10-CM | POA: Diagnosis not present

## 2023-01-31 DIAGNOSIS — M170 Bilateral primary osteoarthritis of knee: Secondary | ICD-10-CM | POA: Diagnosis not present

## 2023-02-01 DIAGNOSIS — M170 Bilateral primary osteoarthritis of knee: Secondary | ICD-10-CM | POA: Diagnosis not present

## 2023-02-02 DIAGNOSIS — M170 Bilateral primary osteoarthritis of knee: Secondary | ICD-10-CM | POA: Diagnosis not present

## 2023-02-03 DIAGNOSIS — M170 Bilateral primary osteoarthritis of knee: Secondary | ICD-10-CM | POA: Diagnosis not present

## 2023-02-04 DIAGNOSIS — M170 Bilateral primary osteoarthritis of knee: Secondary | ICD-10-CM | POA: Diagnosis not present

## 2023-02-05 DIAGNOSIS — M170 Bilateral primary osteoarthritis of knee: Secondary | ICD-10-CM | POA: Diagnosis not present

## 2023-02-06 DIAGNOSIS — M170 Bilateral primary osteoarthritis of knee: Secondary | ICD-10-CM | POA: Diagnosis not present

## 2023-02-07 DIAGNOSIS — M170 Bilateral primary osteoarthritis of knee: Secondary | ICD-10-CM | POA: Diagnosis not present

## 2023-02-08 DIAGNOSIS — M170 Bilateral primary osteoarthritis of knee: Secondary | ICD-10-CM | POA: Diagnosis not present

## 2023-02-09 DIAGNOSIS — M170 Bilateral primary osteoarthritis of knee: Secondary | ICD-10-CM | POA: Diagnosis not present

## 2023-02-10 DIAGNOSIS — M170 Bilateral primary osteoarthritis of knee: Secondary | ICD-10-CM | POA: Diagnosis not present

## 2023-02-11 DIAGNOSIS — M170 Bilateral primary osteoarthritis of knee: Secondary | ICD-10-CM | POA: Diagnosis not present

## 2023-02-12 DIAGNOSIS — M170 Bilateral primary osteoarthritis of knee: Secondary | ICD-10-CM | POA: Diagnosis not present

## 2023-02-13 DIAGNOSIS — M170 Bilateral primary osteoarthritis of knee: Secondary | ICD-10-CM | POA: Diagnosis not present

## 2023-02-14 DIAGNOSIS — M170 Bilateral primary osteoarthritis of knee: Secondary | ICD-10-CM | POA: Diagnosis not present

## 2023-02-15 DIAGNOSIS — M170 Bilateral primary osteoarthritis of knee: Secondary | ICD-10-CM | POA: Diagnosis not present

## 2023-02-16 DIAGNOSIS — M170 Bilateral primary osteoarthritis of knee: Secondary | ICD-10-CM | POA: Diagnosis not present

## 2023-02-17 DIAGNOSIS — M170 Bilateral primary osteoarthritis of knee: Secondary | ICD-10-CM | POA: Diagnosis not present

## 2023-02-18 DIAGNOSIS — M170 Bilateral primary osteoarthritis of knee: Secondary | ICD-10-CM | POA: Diagnosis not present

## 2023-02-19 DIAGNOSIS — M170 Bilateral primary osteoarthritis of knee: Secondary | ICD-10-CM | POA: Diagnosis not present

## 2023-02-20 DIAGNOSIS — M170 Bilateral primary osteoarthritis of knee: Secondary | ICD-10-CM | POA: Diagnosis not present

## 2023-02-21 DIAGNOSIS — M170 Bilateral primary osteoarthritis of knee: Secondary | ICD-10-CM | POA: Diagnosis not present

## 2023-02-22 DIAGNOSIS — M170 Bilateral primary osteoarthritis of knee: Secondary | ICD-10-CM | POA: Diagnosis not present

## 2023-02-23 DIAGNOSIS — M170 Bilateral primary osteoarthritis of knee: Secondary | ICD-10-CM | POA: Diagnosis not present

## 2023-02-24 DIAGNOSIS — M170 Bilateral primary osteoarthritis of knee: Secondary | ICD-10-CM | POA: Diagnosis not present

## 2023-02-25 DIAGNOSIS — M170 Bilateral primary osteoarthritis of knee: Secondary | ICD-10-CM | POA: Diagnosis not present

## 2023-02-26 DIAGNOSIS — M170 Bilateral primary osteoarthritis of knee: Secondary | ICD-10-CM | POA: Diagnosis not present

## 2023-02-27 DIAGNOSIS — M170 Bilateral primary osteoarthritis of knee: Secondary | ICD-10-CM | POA: Diagnosis not present

## 2023-02-28 DIAGNOSIS — M170 Bilateral primary osteoarthritis of knee: Secondary | ICD-10-CM | POA: Diagnosis not present

## 2023-03-01 DIAGNOSIS — M170 Bilateral primary osteoarthritis of knee: Secondary | ICD-10-CM | POA: Diagnosis not present

## 2023-03-02 DIAGNOSIS — M170 Bilateral primary osteoarthritis of knee: Secondary | ICD-10-CM | POA: Diagnosis not present

## 2023-03-03 DIAGNOSIS — M170 Bilateral primary osteoarthritis of knee: Secondary | ICD-10-CM | POA: Diagnosis not present

## 2023-03-04 DIAGNOSIS — M170 Bilateral primary osteoarthritis of knee: Secondary | ICD-10-CM | POA: Diagnosis not present

## 2023-03-05 DIAGNOSIS — M170 Bilateral primary osteoarthritis of knee: Secondary | ICD-10-CM | POA: Diagnosis not present

## 2023-03-06 DIAGNOSIS — M170 Bilateral primary osteoarthritis of knee: Secondary | ICD-10-CM | POA: Diagnosis not present

## 2023-03-07 DIAGNOSIS — M170 Bilateral primary osteoarthritis of knee: Secondary | ICD-10-CM | POA: Diagnosis not present

## 2023-03-08 DIAGNOSIS — M170 Bilateral primary osteoarthritis of knee: Secondary | ICD-10-CM | POA: Diagnosis not present

## 2023-03-09 DIAGNOSIS — M170 Bilateral primary osteoarthritis of knee: Secondary | ICD-10-CM | POA: Diagnosis not present

## 2023-03-10 DIAGNOSIS — M170 Bilateral primary osteoarthritis of knee: Secondary | ICD-10-CM | POA: Diagnosis not present

## 2023-03-11 DIAGNOSIS — M170 Bilateral primary osteoarthritis of knee: Secondary | ICD-10-CM | POA: Diagnosis not present

## 2023-03-12 DIAGNOSIS — M170 Bilateral primary osteoarthritis of knee: Secondary | ICD-10-CM | POA: Diagnosis not present

## 2023-03-13 DIAGNOSIS — M170 Bilateral primary osteoarthritis of knee: Secondary | ICD-10-CM | POA: Diagnosis not present

## 2023-03-14 DIAGNOSIS — M170 Bilateral primary osteoarthritis of knee: Secondary | ICD-10-CM | POA: Diagnosis not present

## 2023-03-15 DIAGNOSIS — M170 Bilateral primary osteoarthritis of knee: Secondary | ICD-10-CM | POA: Diagnosis not present

## 2023-03-16 DIAGNOSIS — M170 Bilateral primary osteoarthritis of knee: Secondary | ICD-10-CM | POA: Diagnosis not present

## 2023-03-17 DIAGNOSIS — M170 Bilateral primary osteoarthritis of knee: Secondary | ICD-10-CM | POA: Diagnosis not present

## 2023-03-18 DIAGNOSIS — M170 Bilateral primary osteoarthritis of knee: Secondary | ICD-10-CM | POA: Diagnosis not present

## 2023-03-19 DIAGNOSIS — M170 Bilateral primary osteoarthritis of knee: Secondary | ICD-10-CM | POA: Diagnosis not present

## 2023-03-20 DIAGNOSIS — M170 Bilateral primary osteoarthritis of knee: Secondary | ICD-10-CM | POA: Diagnosis not present

## 2023-03-21 DIAGNOSIS — M170 Bilateral primary osteoarthritis of knee: Secondary | ICD-10-CM | POA: Diagnosis not present

## 2023-03-22 DIAGNOSIS — M170 Bilateral primary osteoarthritis of knee: Secondary | ICD-10-CM | POA: Diagnosis not present

## 2023-03-23 DIAGNOSIS — M170 Bilateral primary osteoarthritis of knee: Secondary | ICD-10-CM | POA: Diagnosis not present

## 2023-03-24 DIAGNOSIS — M170 Bilateral primary osteoarthritis of knee: Secondary | ICD-10-CM | POA: Diagnosis not present

## 2023-03-24 DIAGNOSIS — J029 Acute pharyngitis, unspecified: Secondary | ICD-10-CM | POA: Diagnosis not present

## 2023-03-24 DIAGNOSIS — J069 Acute upper respiratory infection, unspecified: Secondary | ICD-10-CM | POA: Diagnosis not present

## 2023-03-25 DIAGNOSIS — M170 Bilateral primary osteoarthritis of knee: Secondary | ICD-10-CM | POA: Diagnosis not present

## 2023-03-26 DIAGNOSIS — M170 Bilateral primary osteoarthritis of knee: Secondary | ICD-10-CM | POA: Diagnosis not present

## 2023-03-27 DIAGNOSIS — M170 Bilateral primary osteoarthritis of knee: Secondary | ICD-10-CM | POA: Diagnosis not present

## 2023-03-28 DIAGNOSIS — M170 Bilateral primary osteoarthritis of knee: Secondary | ICD-10-CM | POA: Diagnosis not present

## 2023-03-29 DIAGNOSIS — M170 Bilateral primary osteoarthritis of knee: Secondary | ICD-10-CM | POA: Diagnosis not present

## 2023-03-30 DIAGNOSIS — M170 Bilateral primary osteoarthritis of knee: Secondary | ICD-10-CM | POA: Diagnosis not present

## 2023-03-31 DIAGNOSIS — M170 Bilateral primary osteoarthritis of knee: Secondary | ICD-10-CM | POA: Diagnosis not present

## 2023-04-01 DIAGNOSIS — M170 Bilateral primary osteoarthritis of knee: Secondary | ICD-10-CM | POA: Diagnosis not present

## 2023-04-02 DIAGNOSIS — M170 Bilateral primary osteoarthritis of knee: Secondary | ICD-10-CM | POA: Diagnosis not present

## 2023-04-03 DIAGNOSIS — M170 Bilateral primary osteoarthritis of knee: Secondary | ICD-10-CM | POA: Diagnosis not present

## 2023-04-04 DIAGNOSIS — M170 Bilateral primary osteoarthritis of knee: Secondary | ICD-10-CM | POA: Diagnosis not present

## 2023-04-05 DIAGNOSIS — M170 Bilateral primary osteoarthritis of knee: Secondary | ICD-10-CM | POA: Diagnosis not present

## 2023-04-06 DIAGNOSIS — M170 Bilateral primary osteoarthritis of knee: Secondary | ICD-10-CM | POA: Diagnosis not present

## 2023-04-07 DIAGNOSIS — M170 Bilateral primary osteoarthritis of knee: Secondary | ICD-10-CM | POA: Diagnosis not present

## 2023-04-08 DIAGNOSIS — M170 Bilateral primary osteoarthritis of knee: Secondary | ICD-10-CM | POA: Diagnosis not present

## 2023-04-09 DIAGNOSIS — M170 Bilateral primary osteoarthritis of knee: Secondary | ICD-10-CM | POA: Diagnosis not present

## 2023-04-10 DIAGNOSIS — M170 Bilateral primary osteoarthritis of knee: Secondary | ICD-10-CM | POA: Diagnosis not present

## 2023-04-11 DIAGNOSIS — M170 Bilateral primary osteoarthritis of knee: Secondary | ICD-10-CM | POA: Diagnosis not present

## 2023-04-12 DIAGNOSIS — M170 Bilateral primary osteoarthritis of knee: Secondary | ICD-10-CM | POA: Diagnosis not present

## 2023-04-13 DIAGNOSIS — M170 Bilateral primary osteoarthritis of knee: Secondary | ICD-10-CM | POA: Diagnosis not present

## 2023-04-14 DIAGNOSIS — M170 Bilateral primary osteoarthritis of knee: Secondary | ICD-10-CM | POA: Diagnosis not present

## 2023-04-15 DIAGNOSIS — M170 Bilateral primary osteoarthritis of knee: Secondary | ICD-10-CM | POA: Diagnosis not present

## 2023-04-16 DIAGNOSIS — M170 Bilateral primary osteoarthritis of knee: Secondary | ICD-10-CM | POA: Diagnosis not present

## 2023-04-17 DIAGNOSIS — M170 Bilateral primary osteoarthritis of knee: Secondary | ICD-10-CM | POA: Diagnosis not present

## 2023-04-18 DIAGNOSIS — M170 Bilateral primary osteoarthritis of knee: Secondary | ICD-10-CM | POA: Diagnosis not present

## 2023-04-19 DIAGNOSIS — M170 Bilateral primary osteoarthritis of knee: Secondary | ICD-10-CM | POA: Diagnosis not present

## 2023-04-20 DIAGNOSIS — M170 Bilateral primary osteoarthritis of knee: Secondary | ICD-10-CM | POA: Diagnosis not present

## 2023-04-21 DIAGNOSIS — M170 Bilateral primary osteoarthritis of knee: Secondary | ICD-10-CM | POA: Diagnosis not present

## 2023-04-22 DIAGNOSIS — M170 Bilateral primary osteoarthritis of knee: Secondary | ICD-10-CM | POA: Diagnosis not present

## 2023-04-23 DIAGNOSIS — M170 Bilateral primary osteoarthritis of knee: Secondary | ICD-10-CM | POA: Diagnosis not present

## 2023-04-24 DIAGNOSIS — M170 Bilateral primary osteoarthritis of knee: Secondary | ICD-10-CM | POA: Diagnosis not present

## 2023-04-25 DIAGNOSIS — M170 Bilateral primary osteoarthritis of knee: Secondary | ICD-10-CM | POA: Diagnosis not present

## 2023-04-26 DIAGNOSIS — M170 Bilateral primary osteoarthritis of knee: Secondary | ICD-10-CM | POA: Diagnosis not present

## 2023-04-27 DIAGNOSIS — M170 Bilateral primary osteoarthritis of knee: Secondary | ICD-10-CM | POA: Diagnosis not present

## 2023-04-28 DIAGNOSIS — M170 Bilateral primary osteoarthritis of knee: Secondary | ICD-10-CM | POA: Diagnosis not present

## 2023-04-29 DIAGNOSIS — M170 Bilateral primary osteoarthritis of knee: Secondary | ICD-10-CM | POA: Diagnosis not present

## 2023-04-30 DIAGNOSIS — M170 Bilateral primary osteoarthritis of knee: Secondary | ICD-10-CM | POA: Diagnosis not present

## 2023-05-01 DIAGNOSIS — M170 Bilateral primary osteoarthritis of knee: Secondary | ICD-10-CM | POA: Diagnosis not present

## 2023-05-02 DIAGNOSIS — M170 Bilateral primary osteoarthritis of knee: Secondary | ICD-10-CM | POA: Diagnosis not present

## 2023-05-03 DIAGNOSIS — M170 Bilateral primary osteoarthritis of knee: Secondary | ICD-10-CM | POA: Diagnosis not present

## 2023-05-04 DIAGNOSIS — M170 Bilateral primary osteoarthritis of knee: Secondary | ICD-10-CM | POA: Diagnosis not present

## 2023-05-05 ENCOUNTER — Emergency Department: Payer: Medicaid Other

## 2023-05-05 ENCOUNTER — Other Ambulatory Visit: Payer: Self-pay

## 2023-05-05 DIAGNOSIS — J45909 Unspecified asthma, uncomplicated: Secondary | ICD-10-CM | POA: Insufficient documentation

## 2023-05-05 DIAGNOSIS — Z7951 Long term (current) use of inhaled steroids: Secondary | ICD-10-CM | POA: Insufficient documentation

## 2023-05-05 DIAGNOSIS — M170 Bilateral primary osteoarthritis of knee: Secondary | ICD-10-CM | POA: Diagnosis not present

## 2023-05-05 DIAGNOSIS — M19071 Primary osteoarthritis, right ankle and foot: Secondary | ICD-10-CM | POA: Diagnosis not present

## 2023-05-05 DIAGNOSIS — I10 Essential (primary) hypertension: Secondary | ICD-10-CM | POA: Diagnosis not present

## 2023-05-05 DIAGNOSIS — W182XXA Fall in (into) shower or empty bathtub, initial encounter: Secondary | ICD-10-CM | POA: Insufficient documentation

## 2023-05-05 DIAGNOSIS — M7731 Calcaneal spur, right foot: Secondary | ICD-10-CM | POA: Diagnosis not present

## 2023-05-05 DIAGNOSIS — S92511A Displaced fracture of proximal phalanx of right lesser toe(s), initial encounter for closed fracture: Secondary | ICD-10-CM | POA: Diagnosis not present

## 2023-05-05 DIAGNOSIS — Z21 Asymptomatic human immunodeficiency virus [HIV] infection status: Secondary | ICD-10-CM | POA: Diagnosis not present

## 2023-05-05 DIAGNOSIS — Z79899 Other long term (current) drug therapy: Secondary | ICD-10-CM | POA: Diagnosis not present

## 2023-05-05 DIAGNOSIS — S99921A Unspecified injury of right foot, initial encounter: Secondary | ICD-10-CM | POA: Diagnosis present

## 2023-05-05 DIAGNOSIS — S92514A Nondisplaced fracture of proximal phalanx of right lesser toe(s), initial encounter for closed fracture: Secondary | ICD-10-CM | POA: Insufficient documentation

## 2023-05-05 DIAGNOSIS — Z7982 Long term (current) use of aspirin: Secondary | ICD-10-CM | POA: Insufficient documentation

## 2023-05-05 NOTE — ED Triage Notes (Addendum)
Pt reports stubbing right 4th and 5th toes on right foot on bed tonight.

## 2023-05-06 ENCOUNTER — Emergency Department
Admission: EM | Admit: 2023-05-06 | Discharge: 2023-05-06 | Disposition: A | Discharge status: Home / Self Care | Payer: Medicaid Other | Attending: Emergency Medicine | Admitting: Emergency Medicine

## 2023-05-06 DIAGNOSIS — S92504A Nondisplaced unspecified fracture of right lesser toe(s), initial encounter for closed fracture: Secondary | ICD-10-CM

## 2023-05-06 DIAGNOSIS — M170 Bilateral primary osteoarthritis of knee: Secondary | ICD-10-CM | POA: Diagnosis not present

## 2023-05-06 MED ORDER — OXYCODONE-ACETAMINOPHEN 5-325 MG PO TABS: 1.0000 | ORAL_TABLET | ORAL | 0 refills | Status: AC | PRN

## 2023-05-06 MED ORDER — OXYCODONE-ACETAMINOPHEN 5-325 MG PO TABS
1.0000 | ORAL_TABLET | Freq: Once | ORAL | Status: AC
  Administered 2023-05-06: 1 via ORAL
  Filled 2023-05-06: qty 1

## 2023-05-06 MED ORDER — IBUPROFEN 800 MG PO TABS
800.0000 mg | ORAL_TABLET | Freq: Once | ORAL | Status: AC
  Administered 2023-05-06: 800 mg via ORAL
  Filled 2023-05-06: qty 1

## 2023-05-06 NOTE — Discharge Instructions (Signed)
You may take Ibuprofen as needed for pain, Percocet as needed for more severe pain.  [name] tape your fourth toe to your fifth toe, wear podiatric shoe and use walker as needed.  Return to the ER for worsening symptoms or other concerns.

## 2023-05-06 NOTE — ED Provider Notes (Signed)
[facility] Provider Note    Event Date/Time   First MD Initiated Contact with Patient 05/06/23 0148     (approximate)   History   Toe Injury   HPI  Diane Castillo is a 52 y.o. female who presents to the ED from home with a chief complaint of right toe injury.  Patient was stepping out of the shower and stubbed her right toe against a chair.  Complains of right fourth toe pain and swelling.  Voices no other complaints or injuries.     Past Medical History   Past Medical History:  Diagnosis Date   Asthma    DVT (deep venous thrombosis) (HCC)    right leg   Hypertension    Immune deficiency disorder [facility])      Active Problem List   Patient Active Problem List   Diagnosis Date Noted   Rectal abscess 10/23/2022   H/O: hysterectomy 11/22/2020   Anxiety 01/27/2020   BMI 40.0-44.9, adult (HCC); 186 lbs 12/22/2018   Chronic hepatitis C without hepatic coma (HCC) 03/18/2017   Primary localized osteoarthritis of right knee 10/30/2016   Rupture of medial head of right gastrocnemius 11/15/2015   Right leg pain 11/14/2015   Gastrocnemius muscle strain 11/14/2015   Hypokalemia 11/14/2015   Metabolic acidosis 11/14/2015   Hyperglycemia 11/14/2015   Drug abuse (HCC) cocaine, MJ 11/14/2015   Rhabdomyolysis 11/13/2015   HTN (hypertension) 09/06/2014   Asthma 04/24/2010   Iron deficiency anemia 04/24/2010   Personal history of venous thrombosis and embolism 04/24/2010   Major depressive disorder, recurrent episode, moderate (HCC) 11/30/2009   Human immunodeficiency virus (HIV) disease (HCC) 1996 09/30/2000     Past Surgical History   Past Surgical History:  Procedure Laterality Date   ABDOMINAL HYSTERECTOMY     CHOLECYSTECTOMY     HAND SURGERY     HERNIA REPAIR  2014   ventral   INCISION AND DRAINAGE ABSCESS N/A 10/23/2022   Procedure: INCISION AND DRAINAGE RECTAL ABSCESS;  Surgeon: Carolan Shiver, MD;  Location: [facility] ORS;  Service:  General;  Laterality: N/A;   KNEE CLOSED REDUCTION Right 12/06/2016   Procedure: CLOSED MANIPULATION KNEE;  Surgeon: Kennedy Bucker, MD;  Location: [facility] ORS;  Service: Orthopedics;  Laterality: Right;   KNEE SURGERY     ORIF FINGER FRACTURE Left    middle finger, metal placed   TUBAL LIGATION       Home Medications   Prior to Admission medications   Medication Sig Start Date End Date Taking? Authorizing Provider  oxyCODONE-acetaminophen (PERCOCET/ROXICET) 5-325 MG tablet Take 1 tablet by mouth every 4 (four) hours as needed for severe pain (pain score 7-10). 05/06/23  Yes Irean Hong, MD  acetaminophen (TYLENOL) 500 MG tablet Take 1,000 mg by mouth daily as needed for moderate pain or headache.    [provider]  albuterol (VENTOLIN HFA) 108 (90 Base) MCG/ACT inhaler Inhale 2 puffs into the lungs every 6 (six) hours as needed for wheezing or shortness of breath.  10/03/15   [provider]  aspirin EC 81 MG tablet Take 81 mg by mouth daily.     [provider]  Aspirin-Salicylamide-Caffeine (ARTHRITIS STRENGTH BC POWDER PO) Take 1 packet by mouth daily as needed (pain).    [provider]  bictegravir-emtricitabine-tenofovir AF (BIKTARVY) 50-200-25 MG TABS tablet Take 1 tablet by mouth daily. 12/07/19   [provider]  diazepam (VALIUM) 5 MG tablet Take 5 mg by mouth every 8 (  eight) hours as needed. Patient not taking: Reported on 11/22/2020 02/28/20   [provider]  diphenhydrAMINE (BENADRYL) 25 MG tablet Take 25 mg by mouth daily as needed for allergies.    [provider]  docusate sodium (COLACE) 100 MG capsule Take 1 capsule (100 mg total) by mouth 2 (two) times daily. Patient taking differently: Take 100 mg by mouth daily.  11/14/15   Katharina Caper, MD  dolutegravir (TIVICAY) 50 MG tablet Take 50 mg by mouth daily. 05/16/15   [provider]  emtricitabine-tenofovir AF (DESCOVY) 200-25 MG tablet Take 1 tablet by  mouth daily.    [provider]  enalapril (VASOTEC) 10 MG tablet Take 10 mg by mouth daily.    [provider]  esomeprazole (NEXIUM) 40 MG capsule Take 40 mg by mouth 2 (two) times daily. 01/06/20   [provider]  gabapentin (NEURONTIN) 300 MG capsule Take 1 capsule by mouth daily. Patient not taking: Reported on 11/22/2020 01/15/19   [provider]  HYDROcodone-acetaminophen (NORCO/VICODIN) 5-325 MG tablet Take 1 tablet by mouth every 4 (four) hours as needed for moderate pain. 09/09/22 09/09/23  Cuthriell, Delorise Royals, PA-C  ketorolac (TORADOL) 10 MG tablet Take 1 tablet (10 mg total) by mouth every 8 (eight) hours as needed for moderate pain. 06/01/17   Tommi Rumps, PA-C  Lidocaine 0.5 % GEL Apply 1 application topically 3 (three) times daily. Patient not taking: Reported on 10/23/2022 09/28/17   Enid Derry, PA-C  losartan (COZAAR) 50 MG tablet Take 50 mg by mouth daily.    [provider]  Menthol, Topical Analgesic, (ICY HOT EX) Apply 1 application topically daily as needed (pain).    [provider]  predniSONE (DELTASONE) 10 MG tablet Take 1 tablet (10 mg total) by mouth daily. 6,5,4,3,2,1 six day taper Patient not taking: Reported on 10/23/2022 12/13/21   Evon Slack, PA-C  QVAR REDIHALER 40 MCG/ACT inhaler SMARTSIG:2 Puff(s) By Mouth Twice Daily 02/04/20   [provider]  sertraline (ZOLOFT) 50 MG tablet Take 1/2 pill by mouth daily for 1 week then increase to 1 pill daily Patient not taking: Reported on 11/22/2020 01/27/20   [provider]  sulfamethoxazole-trimethoprim (BACTRIM DS) 800-160 MG tablet Take 1 tablet by mouth 2 (two) times daily. Patient not taking: Reported on 10/23/2022 09/09/22   Cuthriell, Delorise Royals, PA-C  valACYclovir (VALTREX) 500 MG tablet Take 500 mg by mouth 2 (two) times daily. 10/03/15   [provider]     Allergies  Penicillins and Tramadol   Family History   Family  History  Problem Relation Age of Onset   Heart disease Mother      Physical Exam  Triage Vital Signs: ED Triage Vitals  Encounter Vitals Group     BP 05/05/23 2204 92/67     Systolic BP Percentile --      Diastolic BP Percentile --      Pulse Rate 05/05/23 2204 67     Resp 05/05/23 2204 18     Temp 05/05/23 2204 98.4 F (36.9 C)     Temp Source 05/06/23 0053 Oral     SpO2 05/05/23 2204 100 %     Weight 05/05/23 2203 223 lb (101.2 kg)     Height 05/05/23 2203 5\' 5"  (1.651 m)     Head Circumference --      Peak Flow --      Pain Score 05/05/23 2203 10     Pain Loc --  Pain Education --      Exclude from Growth Chart --     Updated Vital Signs: BP (!) 148/126 (BP Location: Left Arm)   Pulse (!) 57   Temp 97.7 F (36.5 C) (Oral)   Resp 16   Ht 5\' 5"  (1.651 m)   Wt 101.2 kg   LMP 11/23/2014   SpO2 96%   BMI 37.11 kg/m    General: Asleep, awakened for exam, no distress.  CV:  Good peripheral perfusion.  Resp:  Normal effort.  Abd:  No distention.  Other:  Right foot: Fourth toe swollen and tender to palpation.   ED Results / Procedures / Treatments  Labs (all labs ordered are listed, but only abnormal results are displayed) Labs Reviewed - No data to display   EKG  None   RADIOLOGY I have independently visualized and interpreted patient's imaging study as well as noted the radiology interpretation:  Right foot: Minimally displaced fourth proximal phalangeal fracture  Official radiology report(s): DG Foot Complete Right  Result Date: 05/05/2023 CLINICAL DATA:  Injured fourth and fifth digits, hit on bed EXAM: RIGHT FOOT COMPLETE - 3+ VIEW COMPARISON:  None Available. FINDINGS: Frontal, oblique, and lateral views of the right foot are obtained. There is an oblique extra-articular fracture of the fourth proximal phalangeal diaphysis. Mild lateral angulation at the fracture site. There are no other acute displaced fractures. Diffuse osteoarthritis  greatest at the first metatarsophalangeal joint. Prominent inferior calcaneal spur. Soft tissue swelling of the forefoot. IMPRESSION: 1. Minimally displaced fourth proximal phalangeal fracture as above, with overlying soft tissue swelling. 2. 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 Electronically Signed   By: Sharlet Salina M.D.   On: 05/05/2023 22:59     PROCEDURES:  Critical Care performed: No  Procedures   MEDICATIONS ORDERED IN ED: Medications  ibuprofen (ADVIL) tablet 800 mg (has no administration in time range)  oxyCODONE-acetaminophen (PERCOCET/ROXICET) 5-325 MG per tablet 1 tablet (has no administration in time range)     IMPRESSION / MDM / ASSESSMENT AND PLAN / ED COURSE  I reviewed the triage vital signs and the nursing notes.                             52 year old female presenting with toe injury.  Fourth digit fracture noted on x-ray.  Will administer NSAID, analgesia, buddy tape toes, podiatric shoe and walker.  Patient will follow-up with podiatry as needed.  Strict return precautions given.  Patient verbalizes understanding and agrees with plan of care.  Patient's presentation is most consistent with acute, uncomplicated illness.   FINAL CLINICAL IMPRESSION(S) / ED DIAGNOSES   Final diagnoses:  Closed nondisplaced fracture of phalanx of lesser toe of right foot, unspecified phalanx, initial encounter     Rx / DC Orders   ED Discharge Orders          Ordered    oxyCODONE-acetaminophen (PERCOCET/ROXICET) 5-325 MG tablet  Every 4 hours PRN        05/06/23 0153             Note:  This document was prepared using Dragon voice recognition software and may include unintentional dictation errors.   Irean Hong, MD 05/06/23 [phone]

## 2023-05-06 NOTE — ED Notes (Signed)
RN to bedside to answer call bell. Pt is asking for pain medication. I informed pt she would see the MD shortly.

## 2023-05-07 DIAGNOSIS — M170 Bilateral primary osteoarthritis of knee: Secondary | ICD-10-CM | POA: Diagnosis not present

## 2023-05-08 DIAGNOSIS — M170 Bilateral primary osteoarthritis of knee: Secondary | ICD-10-CM | POA: Diagnosis not present

## 2023-05-09 DIAGNOSIS — M170 Bilateral primary osteoarthritis of knee: Secondary | ICD-10-CM | POA: Diagnosis not present

## 2023-05-10 DIAGNOSIS — M170 Bilateral primary osteoarthritis of knee: Secondary | ICD-10-CM | POA: Diagnosis not present

## 2023-05-11 DIAGNOSIS — M170 Bilateral primary osteoarthritis of knee: Secondary | ICD-10-CM | POA: Diagnosis not present

## 2023-05-12 DIAGNOSIS — M170 Bilateral primary osteoarthritis of knee: Secondary | ICD-10-CM | POA: Diagnosis not present

## 2023-05-13 DIAGNOSIS — M170 Bilateral primary osteoarthritis of knee: Secondary | ICD-10-CM | POA: Diagnosis not present

## 2023-05-14 DIAGNOSIS — M170 Bilateral primary osteoarthritis of knee: Secondary | ICD-10-CM | POA: Diagnosis not present

## 2023-05-15 DIAGNOSIS — M170 Bilateral primary osteoarthritis of knee: Secondary | ICD-10-CM | POA: Diagnosis not present

## 2023-05-16 DIAGNOSIS — M170 Bilateral primary osteoarthritis of knee: Secondary | ICD-10-CM | POA: Diagnosis not present

## 2023-05-17 DIAGNOSIS — M170 Bilateral primary osteoarthritis of knee: Secondary | ICD-10-CM | POA: Diagnosis not present

## 2023-05-18 DIAGNOSIS — M170 Bilateral primary osteoarthritis of knee: Secondary | ICD-10-CM | POA: Diagnosis not present

## 2023-05-19 DIAGNOSIS — M170 Bilateral primary osteoarthritis of knee: Secondary | ICD-10-CM | POA: Diagnosis not present

## 2023-05-20 DIAGNOSIS — M170 Bilateral primary osteoarthritis of knee: Secondary | ICD-10-CM | POA: Diagnosis not present

## 2023-05-21 DIAGNOSIS — M170 Bilateral primary osteoarthritis of knee: Secondary | ICD-10-CM | POA: Diagnosis not present

## 2023-05-22 ENCOUNTER — Encounter: Payer: Self-pay | Admitting: Podiatry

## 2023-05-22 ENCOUNTER — Ambulatory Visit: Payer: Medicaid Other | Admitting: Podiatry

## 2023-05-22 VITALS — Ht 65.0 in | Wt 223.0 lb

## 2023-05-22 DIAGNOSIS — S92501A Displaced unspecified fracture of right lesser toe(s), initial encounter for closed fracture: Secondary | ICD-10-CM | POA: Diagnosis not present

## 2023-05-22 DIAGNOSIS — S92505A Nondisplaced unspecified fracture of left lesser toe(s), initial encounter for closed fracture: Secondary | ICD-10-CM

## 2023-05-22 DIAGNOSIS — M170 Bilateral primary osteoarthritis of knee: Secondary | ICD-10-CM | POA: Diagnosis not present

## 2023-05-22 MED ORDER — DICLOFENAC SODIUM 75 MG PO TBEC: 75.0000 mg | DELAYED_RELEASE_TABLET | Freq: Two times a day (BID) | ORAL | 0 refills | Status: AC

## 2023-05-22 NOTE — Patient Instructions (Signed)
VISIT SUMMARY:  You came in today due to severe pain in the fourth toe of your right foot, which started two weeks ago after you accidentally hit it on a chair. The pain has been constant and severe, especially when standing or walking, and home remedies have not provided relief.  YOUR PLAN:  -FOURTH TOE FRACTURE: You have a fracture in the fourth toe of your right foot, which is causing pain, swelling, and bruising. A fracture is a break in the bone. To help with recovery, you will continue using a surgical shoe for 2-3 more weeks or until you feel comfortable in a regular shoe. We will apply a Coban wrap for compression to reduce swelling and use ice instead of heat. For pain management, you will start taking Diclofenac as needed. You will also be on light duty at work for 4 weeks to allow your toe to rest and heal. We expect a full recovery by mid-January.  INSTRUCTIONS:  Please continue using the surgical shoe and apply the Coban wrap as instructed. Use ice to reduce swelling and take Diclofenac as needed for pain. You are advised to be on light duty at work for the next 4 weeks. Follow up with [place] if you have any concerns or if the pain does not improve.

## 2023-05-22 NOTE — Progress Notes (Signed)
  Subjective:  Patient ID: Diane Castillo, female    DOB: 1970/07/21,  MRN: [id number]  Chief Complaint  Patient presents with   Toe Injury    Patient is here for possible fracture of right 4th, and 5th toes, patient states that she hit them on chair    Discussed the use of AI scribe software for clinical note transcription with the patient, who gave verbal consent to proceed.  History of Present Illness   The patient presents with a two-week history of severe pain in the fourth toe of her right foot. The pain began after she accidentally hit her toe on a chair while getting out of bed. The pain is described as a constant, severe ache that is exacerbated by standing and walking, activities that are unavoidable due to her work requirements. The patient has attempted home remedies such as soaking the foot in warm water with Epsom salt, but these have provided no relief. The patient also reports that the pain is unlike anything she has experienced before, despite having undergone multiple surgeries in the past.          Objective:    Physical Exam   EXTREMITIES: Foot warm and well perfused with palpable pulses. MUSCULOSKELETAL: Pain on palpation in the mid fourth toe, no pain in the third or fifth toe. Good range of motion in the other digits.       No images are attached to the encounter.    Results          Radiographs taken at emergency room 11/18 show minimally displaced diaphyseal fracture of fourth toe proximal phalanx  [name] splinting demonstrated to the patient with Coban and this was dispensed today Assessment:   1. Closed fracture of phalanx of right fourth toe, initial encounter      Plan:  Patient was evaluated and treated and all questions answered.  Assessment and Plan    Fourth Toe Fracture   In week 2 of recovery from a fourth toe fracture, she is experiencing pain, swelling, and bruising, which is impacting her ability to work. We will continue the use of a  surgical shoe for 2-3 more weeks or until she is comfortable in a regular shoe. A Coban wrap will be applied for compression to reduce swelling, and ice will be used instead of heat for the same purpose. Diclofenac PRN will be started for pain management. She will be placed on light duty at work for 4 weeks to facilitate rest and recovery, with an expectation of full recovery by mid-January.          Return if symptoms worsen or fail to improve.

## 2023-05-23 DIAGNOSIS — M170 Bilateral primary osteoarthritis of knee: Secondary | ICD-10-CM | POA: Diagnosis not present

## 2023-05-24 DIAGNOSIS — M170 Bilateral primary osteoarthritis of knee: Secondary | ICD-10-CM | POA: Diagnosis not present

## 2023-05-25 DIAGNOSIS — M170 Bilateral primary osteoarthritis of knee: Secondary | ICD-10-CM | POA: Diagnosis not present

## 2023-05-26 DIAGNOSIS — M170 Bilateral primary osteoarthritis of knee: Secondary | ICD-10-CM | POA: Diagnosis not present

## 2023-05-27 DIAGNOSIS — M170 Bilateral primary osteoarthritis of knee: Secondary | ICD-10-CM | POA: Diagnosis not present

## 2023-05-28 DIAGNOSIS — M170 Bilateral primary osteoarthritis of knee: Secondary | ICD-10-CM | POA: Diagnosis not present

## 2023-05-29 DIAGNOSIS — M170 Bilateral primary osteoarthritis of knee: Secondary | ICD-10-CM | POA: Diagnosis not present

## 2023-05-30 DIAGNOSIS — M170 Bilateral primary osteoarthritis of knee: Secondary | ICD-10-CM | POA: Diagnosis not present

## 2023-05-31 DIAGNOSIS — M170 Bilateral primary osteoarthritis of knee: Secondary | ICD-10-CM | POA: Diagnosis not present

## 2023-06-01 DIAGNOSIS — M170 Bilateral primary osteoarthritis of knee: Secondary | ICD-10-CM | POA: Diagnosis not present

## 2023-06-02 DIAGNOSIS — M170 Bilateral primary osteoarthritis of knee: Secondary | ICD-10-CM | POA: Diagnosis not present

## 2023-06-03 DIAGNOSIS — M170 Bilateral primary osteoarthritis of knee: Secondary | ICD-10-CM | POA: Diagnosis not present

## 2023-06-04 DIAGNOSIS — M170 Bilateral primary osteoarthritis of knee: Secondary | ICD-10-CM | POA: Diagnosis not present

## 2023-06-05 DIAGNOSIS — M170 Bilateral primary osteoarthritis of knee: Secondary | ICD-10-CM | POA: Diagnosis not present

## 2023-06-06 DIAGNOSIS — M170 Bilateral primary osteoarthritis of knee: Secondary | ICD-10-CM | POA: Diagnosis not present

## 2023-06-07 DIAGNOSIS — M170 Bilateral primary osteoarthritis of knee: Secondary | ICD-10-CM | POA: Diagnosis not present

## 2023-06-08 DIAGNOSIS — M170 Bilateral primary osteoarthritis of knee: Secondary | ICD-10-CM | POA: Diagnosis not present

## 2023-06-09 DIAGNOSIS — M170 Bilateral primary osteoarthritis of knee: Secondary | ICD-10-CM | POA: Diagnosis not present

## 2023-06-10 DIAGNOSIS — M170 Bilateral primary osteoarthritis of knee: Secondary | ICD-10-CM | POA: Diagnosis not present

## 2023-06-11 DIAGNOSIS — M170 Bilateral primary osteoarthritis of knee: Secondary | ICD-10-CM | POA: Diagnosis not present

## 2023-06-12 DIAGNOSIS — M170 Bilateral primary osteoarthritis of knee: Secondary | ICD-10-CM | POA: Diagnosis not present

## 2023-06-13 DIAGNOSIS — M170 Bilateral primary osteoarthritis of knee: Secondary | ICD-10-CM | POA: Diagnosis not present

## 2023-06-14 DIAGNOSIS — M170 Bilateral primary osteoarthritis of knee: Secondary | ICD-10-CM | POA: Diagnosis not present

## 2023-06-15 DIAGNOSIS — M170 Bilateral primary osteoarthritis of knee: Secondary | ICD-10-CM | POA: Diagnosis not present

## 2023-06-16 DIAGNOSIS — M170 Bilateral primary osteoarthritis of knee: Secondary | ICD-10-CM | POA: Diagnosis not present

## 2023-06-17 DIAGNOSIS — M170 Bilateral primary osteoarthritis of knee: Secondary | ICD-10-CM | POA: Diagnosis not present

## 2023-06-18 DIAGNOSIS — M170 Bilateral primary osteoarthritis of knee: Secondary | ICD-10-CM | POA: Diagnosis not present

## 2023-06-19 DIAGNOSIS — M170 Bilateral primary osteoarthritis of knee: Secondary | ICD-10-CM | POA: Diagnosis not present

## 2023-06-20 DIAGNOSIS — M170 Bilateral primary osteoarthritis of knee: Secondary | ICD-10-CM | POA: Diagnosis not present

## 2023-06-20 DIAGNOSIS — S8980XA Other specified injuries of unspecified lower leg, initial encounter: Secondary | ICD-10-CM | POA: Diagnosis not present

## 2023-06-20 DIAGNOSIS — M25561 Pain in right knee: Secondary | ICD-10-CM | POA: Diagnosis not present

## 2023-06-21 DIAGNOSIS — M170 Bilateral primary osteoarthritis of knee: Secondary | ICD-10-CM | POA: Diagnosis not present

## 2023-06-22 ENCOUNTER — Emergency Department: Payer: Self-pay

## 2023-06-22 ENCOUNTER — Other Ambulatory Visit: Payer: Self-pay

## 2023-06-22 ENCOUNTER — Emergency Department
Admission: EM | Admit: 2023-06-22 | Discharge: 2023-06-22 | Disposition: A | Discharge status: Home / Self Care | Payer: Self-pay | Attending: Emergency Medicine | Admitting: Emergency Medicine

## 2023-06-22 ENCOUNTER — Encounter: Payer: Self-pay | Admitting: Emergency Medicine

## 2023-06-22 DIAGNOSIS — M25561 Pain in right knee: Secondary | ICD-10-CM | POA: Insufficient documentation

## 2023-06-22 DIAGNOSIS — M542 Cervicalgia: Secondary | ICD-10-CM | POA: Diagnosis not present

## 2023-06-22 DIAGNOSIS — S0990XA Unspecified injury of head, initial encounter: Secondary | ICD-10-CM | POA: Insufficient documentation

## 2023-06-22 DIAGNOSIS — W01198A Fall on same level from slipping, tripping and stumbling with subsequent striking against other object, initial encounter: Secondary | ICD-10-CM | POA: Diagnosis not present

## 2023-06-22 DIAGNOSIS — W19XXXA Unspecified fall, initial encounter: Secondary | ICD-10-CM | POA: Diagnosis not present

## 2023-06-22 DIAGNOSIS — M25571 Pain in right ankle and joints of right foot: Secondary | ICD-10-CM | POA: Insufficient documentation

## 2023-06-22 DIAGNOSIS — M25551 Pain in right hip: Secondary | ICD-10-CM | POA: Insufficient documentation

## 2023-06-22 DIAGNOSIS — M170 Bilateral primary osteoarthritis of knee: Secondary | ICD-10-CM | POA: Diagnosis not present

## 2023-06-22 DIAGNOSIS — S6991XA Unspecified injury of right wrist, hand and finger(s), initial encounter: Secondary | ICD-10-CM | POA: Diagnosis not present

## 2023-06-22 DIAGNOSIS — M25531 Pain in right wrist: Secondary | ICD-10-CM | POA: Diagnosis not present

## 2023-06-22 MED ORDER — OXYCODONE HCL 5 MG PO TABS
5.0000 mg | ORAL_TABLET | Freq: Once | ORAL | Status: AC
  Administered 2023-06-22: 5 mg via ORAL
  Filled 2023-06-22: qty 1

## 2023-06-22 MED ORDER — CYCLOBENZAPRINE HCL 10 MG PO TABS: 10.0000 mg | ORAL_TABLET | Freq: Three times a day (TID) | ORAL | 0 refills | Status: AC | PRN

## 2023-06-22 MED ORDER — DIAZEPAM 5 MG PO TABS
10.0000 mg | ORAL_TABLET | Freq: Once | ORAL | Status: AC
  Administered 2023-06-22: 10 mg via ORAL
  Filled 2023-06-22: qty 2

## 2023-06-22 MED ORDER — ACETAMINOPHEN 500 MG PO TABS
1000.0000 mg | ORAL_TABLET | Freq: Once | ORAL | Status: AC
  Administered 2023-06-22: 1000 mg via ORAL
  Filled 2023-06-22: qty 2

## 2023-06-22 MED ORDER — IBUPROFEN 600 MG PO TABS
600.0000 mg | ORAL_TABLET | Freq: Once | ORAL | Status: AC
  Administered 2023-06-22: 600 mg via ORAL
  Filled 2023-06-22: qty 1

## 2023-06-22 NOTE — ED Triage Notes (Signed)
 First nurse note: Pt to ED via [facility] from [employer]. Had a fall at [employer]. Did hit head. No LOC. No blood thinners. Pt in c-collar on arrival. Pt reports  back, neck, CP, right hip pain, right knee pain, right ankle pain, right wrist pain. No deformities per EMS. Pt ambulatory on scene.   100% RA 144/91 HR 81

## 2023-06-22 NOTE — ED Triage Notes (Signed)
 Pt to ED via [facility] from [employer] after a slip and fall in the restroom. PT stating pain on entire right side, back and neck. Pt placed in c-collar by EMS.

## 2023-06-22 NOTE — Discharge Instructions (Addendum)
 You were seen in the ER today for evaluation after a fall.  Your imaging was fortunately reassuring.  You can take Tylenol  and ibuprofen  to help with your pain.  I have also sent a short course of muscle relaxer to your pharmacy that you can take as needed.  Do not drive or operate machinery when taking this.  Return to the ER for new or worsening symptoms.

## 2023-06-22 NOTE — ED Notes (Signed)
 Patient transported to X-ray

## 2023-06-22 NOTE — ED Provider Notes (Signed)
 [facility] Provider Note    Event Date/Time   First MD Initiated Contact with Patient 06/22/23 [phone]     (approximate)   History   Fall   HPI  Diane Castillo is a 53 year old female presenting to the ER for evaluation after a fall.  Patient was in the restroom at [employer] when she slipped and fell and hit the back of her head as well as her right side.  Was able to help herself over the side, but has not tried to ambulate.  Currently complaining of pain in her head, neck, right lower back, right leg.  No reported bowel or bladder symptoms.     Physical Exam   Triage Vital Signs: ED Triage Vitals [06/22/23 1508]  Encounter Vitals Group     BP (!) 148/93     Systolic BP Percentile      Diastolic BP Percentile      Pulse Rate 71     Resp 18     Temp 98.4 F (36.9 C)     Temp Source Oral     SpO2 100 %     Weight 202 lb (91.6 kg)     Height 5' 5 (1.651 m)     Head Circumference      Peak Flow      Pain Score 10     Pain Loc      Pain Education      Exclude from Growth Chart     Most recent vital signs: Vitals:   06/22/23 1508 06/22/23 1816  BP: (!) 148/93 (!) 147/98  Pulse: 71 64  Resp: 18 18  Temp: 98.4 F (36.9 C)   SpO2: 100% 100%    Nursing notes and vital signs reviewed.  General: Adult female, laying in bed, awake and interactive Head: Atraumatic Neck: C-collar in place, no midline tenderness, does have tenderness of the right cervical paraspinous muscles Chest: Symmetric chest rise, no tenderness to palpation.  Cardiac: Regular rhythm and rate.  Respiratory: Lungs clear to auscultation Abdomen: Soft, nondistended. No tenderness to palpation.  Pelvis: Stable in AP and lateral compression.  No knee tenderness, does have tenderness over the lumbar paraspinous muscles MSK: No deformity to bilateral upper and lower extremity.  Pain to range of motion at the right knee and ankle  Back: No midline pain, does have reproducible  tenderness palpation over the right lumbar paraspinous muscles Neuro: Alert, oriented. GCS 15. Normal sensation to light touch in bilateral upper and lower extremity. Skin: No evidence of burns or lacerations.  ED Results / Procedures / Treatments   Labs (all labs ordered are listed, but only abnormal results are displayed) Labs Reviewed - No data to display   EKG EKG independently reviewed interpreted by myself (ER attending) demonstrates:    RADIOLOGY Imaging independently reviewed and interpreted by myself demonstrates:  CT head without acute bleed CT C-spine without acute fracture Ankle x-Haze Antillon, foot x-Leinaala Catanese, hip x-Mariaclara Spear, and knee x-Godwin Tedesco without acute fracture  PROCEDURES:  Critical Care performed: No  Procedures   MEDICATIONS ORDERED IN ED: Medications  diazepam  (VALIUM ) tablet 10 mg (10 mg Oral Given 06/22/23 1654)  acetaminophen  (TYLENOL ) tablet 1,000 mg (1,000 mg Oral Given 06/22/23 1655)  ibuprofen  (ADVIL ) tablet 600 mg (600 mg Oral Given 06/22/23 1655)  oxyCODONE  (Oxy IR/ROXICODONE ) immediate release tablet 5 mg (5 mg Oral Given 06/22/23 1753)     IMPRESSION / MDM / ASSESSMENT AND PLAN / ED COURSE  I reviewed  the triage vital signs and the nursing notes.  Differential diagnosis includes, but is not limited to, intracranial bleed, spine fracture, extremity fracture, dislocation, soft tissue injury  Patient's presentation is most consistent with acute presentation with potential threat to life or bodily function.  53 year old female presenting to the ER for evaluation after a fall.  Imaging reassuring.  Discussed possibility of a concussion despite negative CT head.  Treated with multimodal pain control with some improvement in her symptoms.  Patient is comfortable with discharge home.  Will DC with prescription for muscle relaxer for suspected muscle strain of her neck and lower back.  Strict return precautions provided.  Patient discharged in stable condition.      FINAL  CLINICAL IMPRESSION(S) / ED DIAGNOSES   Final diagnoses:  Closed head injury, initial encounter  Neck pain  Pain of right hip  Acute pain of right knee  Acute right ankle pain     Rx / DC Orders   ED Discharge Orders     None        Note:  This document was prepared using Dragon voice recognition software and may include unintentional dictation errors.   Levander Slate, MD 06/22/23 [phone]

## 2023-06-23 DIAGNOSIS — M170 Bilateral primary osteoarthritis of knee: Secondary | ICD-10-CM | POA: Diagnosis not present

## 2023-06-24 DIAGNOSIS — M170 Bilateral primary osteoarthritis of knee: Secondary | ICD-10-CM | POA: Diagnosis not present

## 2023-06-25 DIAGNOSIS — M170 Bilateral primary osteoarthritis of knee: Secondary | ICD-10-CM | POA: Diagnosis not present

## 2023-06-26 DIAGNOSIS — M170 Bilateral primary osteoarthritis of knee: Secondary | ICD-10-CM | POA: Diagnosis not present

## 2023-06-27 DIAGNOSIS — M170 Bilateral primary osteoarthritis of knee: Secondary | ICD-10-CM | POA: Diagnosis not present

## 2023-06-27 DIAGNOSIS — Z96651 Presence of right artificial knee joint: Secondary | ICD-10-CM | POA: Diagnosis not present

## 2023-06-27 DIAGNOSIS — M25561 Pain in right knee: Secondary | ICD-10-CM | POA: Diagnosis not present

## 2023-06-27 DIAGNOSIS — S161XXA Strain of muscle, fascia and tendon at neck level, initial encounter: Secondary | ICD-10-CM | POA: Diagnosis not present

## 2023-06-28 DIAGNOSIS — M170 Bilateral primary osteoarthritis of knee: Secondary | ICD-10-CM | POA: Diagnosis not present

## 2023-06-29 DIAGNOSIS — M170 Bilateral primary osteoarthritis of knee: Secondary | ICD-10-CM | POA: Diagnosis not present

## 2023-06-30 DIAGNOSIS — M170 Bilateral primary osteoarthritis of knee: Secondary | ICD-10-CM | POA: Diagnosis not present

## 2023-07-01 DIAGNOSIS — M170 Bilateral primary osteoarthritis of knee: Secondary | ICD-10-CM | POA: Diagnosis not present

## 2023-07-02 DIAGNOSIS — M170 Bilateral primary osteoarthritis of knee: Secondary | ICD-10-CM | POA: Diagnosis not present

## 2023-07-03 DIAGNOSIS — M170 Bilateral primary osteoarthritis of knee: Secondary | ICD-10-CM | POA: Diagnosis not present

## 2023-07-04 DIAGNOSIS — M170 Bilateral primary osteoarthritis of knee: Secondary | ICD-10-CM | POA: Diagnosis not present

## 2023-07-05 DIAGNOSIS — M170 Bilateral primary osteoarthritis of knee: Secondary | ICD-10-CM | POA: Diagnosis not present

## 2023-07-06 DIAGNOSIS — M170 Bilateral primary osteoarthritis of knee: Secondary | ICD-10-CM | POA: Diagnosis not present

## 2023-07-07 DIAGNOSIS — M170 Bilateral primary osteoarthritis of knee: Secondary | ICD-10-CM | POA: Diagnosis not present

## 2023-07-08 DIAGNOSIS — M170 Bilateral primary osteoarthritis of knee: Secondary | ICD-10-CM | POA: Diagnosis not present

## 2023-07-10 ENCOUNTER — Other Ambulatory Visit: Payer: Self-pay | Admitting: Student

## 2023-07-10 DIAGNOSIS — Z96651 Presence of right artificial knee joint: Secondary | ICD-10-CM

## 2023-07-10 DIAGNOSIS — M170 Bilateral primary osteoarthritis of knee: Secondary | ICD-10-CM | POA: Diagnosis not present

## 2023-07-10 DIAGNOSIS — M25561 Pain in right knee: Secondary | ICD-10-CM

## 2023-07-11 DIAGNOSIS — M170 Bilateral primary osteoarthritis of knee: Secondary | ICD-10-CM | POA: Diagnosis not present

## 2023-07-12 DIAGNOSIS — I10 Essential (primary) hypertension: Secondary | ICD-10-CM | POA: Diagnosis not present

## 2023-07-12 DIAGNOSIS — M170 Bilateral primary osteoarthritis of knee: Secondary | ICD-10-CM | POA: Diagnosis not present

## 2023-07-12 DIAGNOSIS — R0781 Pleurodynia: Secondary | ICD-10-CM | POA: Diagnosis not present

## 2023-07-12 DIAGNOSIS — H6691 Otitis media, unspecified, right ear: Secondary | ICD-10-CM | POA: Diagnosis not present

## 2023-07-12 DIAGNOSIS — Z23 Encounter for immunization: Secondary | ICD-10-CM | POA: Diagnosis not present

## 2023-07-12 DIAGNOSIS — R110 Nausea: Secondary | ICD-10-CM | POA: Diagnosis not present

## 2023-07-12 DIAGNOSIS — R011 Cardiac murmur, unspecified: Secondary | ICD-10-CM | POA: Diagnosis not present

## 2023-07-12 DIAGNOSIS — M1711 Unilateral primary osteoarthritis, right knee: Secondary | ICD-10-CM | POA: Diagnosis not present

## 2023-07-12 DIAGNOSIS — Z6838 Body mass index (BMI) 38.0-38.9, adult: Secondary | ICD-10-CM | POA: Diagnosis not present

## 2023-07-12 DIAGNOSIS — M79604 Pain in right leg: Secondary | ICD-10-CM | POA: Diagnosis not present

## 2023-07-13 DIAGNOSIS — M170 Bilateral primary osteoarthritis of knee: Secondary | ICD-10-CM | POA: Diagnosis not present

## 2023-07-14 DIAGNOSIS — M170 Bilateral primary osteoarthritis of knee: Secondary | ICD-10-CM | POA: Diagnosis not present

## 2023-07-15 DIAGNOSIS — M170 Bilateral primary osteoarthritis of knee: Secondary | ICD-10-CM | POA: Diagnosis not present

## 2023-07-16 DIAGNOSIS — M170 Bilateral primary osteoarthritis of knee: Secondary | ICD-10-CM | POA: Diagnosis not present

## 2023-07-17 DIAGNOSIS — M170 Bilateral primary osteoarthritis of knee: Secondary | ICD-10-CM | POA: Diagnosis not present

## 2023-07-18 DIAGNOSIS — M170 Bilateral primary osteoarthritis of knee: Secondary | ICD-10-CM | POA: Diagnosis not present

## 2023-07-19 DIAGNOSIS — M170 Bilateral primary osteoarthritis of knee: Secondary | ICD-10-CM | POA: Diagnosis not present

## 2023-07-20 DIAGNOSIS — M170 Bilateral primary osteoarthritis of knee: Secondary | ICD-10-CM | POA: Diagnosis not present

## 2023-07-22 DIAGNOSIS — M170 Bilateral primary osteoarthritis of knee: Secondary | ICD-10-CM | POA: Diagnosis not present

## 2023-07-23 DIAGNOSIS — M170 Bilateral primary osteoarthritis of knee: Secondary | ICD-10-CM | POA: Diagnosis not present

## 2023-07-24 DIAGNOSIS — M170 Bilateral primary osteoarthritis of knee: Secondary | ICD-10-CM | POA: Diagnosis not present

## 2023-07-25 DIAGNOSIS — M170 Bilateral primary osteoarthritis of knee: Secondary | ICD-10-CM | POA: Diagnosis not present

## 2023-07-26 DIAGNOSIS — M170 Bilateral primary osteoarthritis of knee: Secondary | ICD-10-CM | POA: Diagnosis not present

## 2023-07-27 DIAGNOSIS — M170 Bilateral primary osteoarthritis of knee: Secondary | ICD-10-CM | POA: Diagnosis not present

## 2023-07-28 DIAGNOSIS — M170 Bilateral primary osteoarthritis of knee: Secondary | ICD-10-CM | POA: Diagnosis not present

## 2023-07-29 DIAGNOSIS — M170 Bilateral primary osteoarthritis of knee: Secondary | ICD-10-CM | POA: Diagnosis not present

## 2023-07-30 DIAGNOSIS — M170 Bilateral primary osteoarthritis of knee: Secondary | ICD-10-CM | POA: Diagnosis not present

## 2023-07-31 DIAGNOSIS — M170 Bilateral primary osteoarthritis of knee: Secondary | ICD-10-CM | POA: Diagnosis not present

## 2023-08-01 DIAGNOSIS — M170 Bilateral primary osteoarthritis of knee: Secondary | ICD-10-CM | POA: Diagnosis not present

## 2023-08-02 DIAGNOSIS — M170 Bilateral primary osteoarthritis of knee: Secondary | ICD-10-CM | POA: Diagnosis not present

## 2023-08-03 DIAGNOSIS — M170 Bilateral primary osteoarthritis of knee: Secondary | ICD-10-CM | POA: Diagnosis not present

## 2023-08-04 DIAGNOSIS — R011 Cardiac murmur, unspecified: Secondary | ICD-10-CM | POA: Diagnosis not present

## 2023-08-04 DIAGNOSIS — M170 Bilateral primary osteoarthritis of knee: Secondary | ICD-10-CM | POA: Diagnosis not present

## 2023-08-05 ENCOUNTER — Other Ambulatory Visit: Payer: Self-pay | Admitting: Student

## 2023-08-05 DIAGNOSIS — M25561 Pain in right knee: Secondary | ICD-10-CM

## 2023-08-05 DIAGNOSIS — Z96651 Presence of right artificial knee joint: Secondary | ICD-10-CM

## 2023-08-05 DIAGNOSIS — M170 Bilateral primary osteoarthritis of knee: Secondary | ICD-10-CM | POA: Diagnosis not present

## 2023-08-06 DIAGNOSIS — M170 Bilateral primary osteoarthritis of knee: Secondary | ICD-10-CM | POA: Diagnosis not present

## 2023-08-07 DIAGNOSIS — M170 Bilateral primary osteoarthritis of knee: Secondary | ICD-10-CM | POA: Diagnosis not present

## 2023-08-08 DIAGNOSIS — M170 Bilateral primary osteoarthritis of knee: Secondary | ICD-10-CM | POA: Diagnosis not present

## 2023-08-09 DIAGNOSIS — M170 Bilateral primary osteoarthritis of knee: Secondary | ICD-10-CM | POA: Diagnosis not present

## 2023-08-10 DIAGNOSIS — M170 Bilateral primary osteoarthritis of knee: Secondary | ICD-10-CM | POA: Diagnosis not present

## 2023-08-11 DIAGNOSIS — M170 Bilateral primary osteoarthritis of knee: Secondary | ICD-10-CM | POA: Diagnosis not present

## 2023-08-12 DIAGNOSIS — M170 Bilateral primary osteoarthritis of knee: Secondary | ICD-10-CM | POA: Diagnosis not present

## 2023-08-13 DIAGNOSIS — M170 Bilateral primary osteoarthritis of knee: Secondary | ICD-10-CM | POA: Diagnosis not present

## 2023-08-14 DIAGNOSIS — M170 Bilateral primary osteoarthritis of knee: Secondary | ICD-10-CM | POA: Diagnosis not present

## 2023-08-15 DIAGNOSIS — M170 Bilateral primary osteoarthritis of knee: Secondary | ICD-10-CM | POA: Diagnosis not present

## 2023-08-17 DIAGNOSIS — M170 Bilateral primary osteoarthritis of knee: Secondary | ICD-10-CM | POA: Diagnosis not present

## 2023-08-18 DIAGNOSIS — M170 Bilateral primary osteoarthritis of knee: Secondary | ICD-10-CM | POA: Diagnosis not present

## 2023-08-19 DIAGNOSIS — M170 Bilateral primary osteoarthritis of knee: Secondary | ICD-10-CM | POA: Diagnosis not present

## 2023-08-20 DIAGNOSIS — M170 Bilateral primary osteoarthritis of knee: Secondary | ICD-10-CM | POA: Diagnosis not present

## 2023-08-21 DIAGNOSIS — M170 Bilateral primary osteoarthritis of knee: Secondary | ICD-10-CM | POA: Diagnosis not present

## 2023-08-22 DIAGNOSIS — M170 Bilateral primary osteoarthritis of knee: Secondary | ICD-10-CM | POA: Diagnosis not present

## 2023-08-23 DIAGNOSIS — M170 Bilateral primary osteoarthritis of knee: Secondary | ICD-10-CM | POA: Diagnosis not present

## 2023-08-24 DIAGNOSIS — M170 Bilateral primary osteoarthritis of knee: Secondary | ICD-10-CM | POA: Diagnosis not present

## 2023-08-25 DIAGNOSIS — M170 Bilateral primary osteoarthritis of knee: Secondary | ICD-10-CM | POA: Diagnosis not present

## 2023-08-26 DIAGNOSIS — M170 Bilateral primary osteoarthritis of knee: Secondary | ICD-10-CM | POA: Diagnosis not present

## 2023-08-27 DIAGNOSIS — M170 Bilateral primary osteoarthritis of knee: Secondary | ICD-10-CM | POA: Diagnosis not present

## 2023-08-28 DIAGNOSIS — M170 Bilateral primary osteoarthritis of knee: Secondary | ICD-10-CM | POA: Diagnosis not present

## 2023-08-29 DIAGNOSIS — M170 Bilateral primary osteoarthritis of knee: Secondary | ICD-10-CM | POA: Diagnosis not present

## 2023-08-30 DIAGNOSIS — M170 Bilateral primary osteoarthritis of knee: Secondary | ICD-10-CM | POA: Diagnosis not present

## 2023-08-31 DIAGNOSIS — M170 Bilateral primary osteoarthritis of knee: Secondary | ICD-10-CM | POA: Diagnosis not present

## 2023-09-01 DIAGNOSIS — M170 Bilateral primary osteoarthritis of knee: Secondary | ICD-10-CM | POA: Diagnosis not present

## 2023-09-02 DIAGNOSIS — M170 Bilateral primary osteoarthritis of knee: Secondary | ICD-10-CM | POA: Diagnosis not present

## 2023-09-03 DIAGNOSIS — M170 Bilateral primary osteoarthritis of knee: Secondary | ICD-10-CM | POA: Diagnosis not present

## 2023-09-04 DIAGNOSIS — M170 Bilateral primary osteoarthritis of knee: Secondary | ICD-10-CM | POA: Diagnosis not present

## 2023-09-05 DIAGNOSIS — M170 Bilateral primary osteoarthritis of knee: Secondary | ICD-10-CM | POA: Diagnosis not present

## 2023-09-06 DIAGNOSIS — M170 Bilateral primary osteoarthritis of knee: Secondary | ICD-10-CM | POA: Diagnosis not present

## 2023-09-07 DIAGNOSIS — M170 Bilateral primary osteoarthritis of knee: Secondary | ICD-10-CM | POA: Diagnosis not present

## 2023-09-08 DIAGNOSIS — M170 Bilateral primary osteoarthritis of knee: Secondary | ICD-10-CM | POA: Diagnosis not present

## 2023-09-09 DIAGNOSIS — M170 Bilateral primary osteoarthritis of knee: Secondary | ICD-10-CM | POA: Diagnosis not present

## 2023-09-10 DIAGNOSIS — M170 Bilateral primary osteoarthritis of knee: Secondary | ICD-10-CM | POA: Diagnosis not present

## 2023-09-11 DIAGNOSIS — M170 Bilateral primary osteoarthritis of knee: Secondary | ICD-10-CM | POA: Diagnosis not present

## 2023-09-12 DIAGNOSIS — M170 Bilateral primary osteoarthritis of knee: Secondary | ICD-10-CM | POA: Diagnosis not present

## 2023-09-13 DIAGNOSIS — M170 Bilateral primary osteoarthritis of knee: Secondary | ICD-10-CM | POA: Diagnosis not present

## 2023-09-14 DIAGNOSIS — M170 Bilateral primary osteoarthritis of knee: Secondary | ICD-10-CM | POA: Diagnosis not present

## 2023-09-15 DIAGNOSIS — M170 Bilateral primary osteoarthritis of knee: Secondary | ICD-10-CM | POA: Diagnosis not present

## 2023-09-16 DIAGNOSIS — M170 Bilateral primary osteoarthritis of knee: Secondary | ICD-10-CM | POA: Diagnosis not present

## 2023-09-17 DIAGNOSIS — M170 Bilateral primary osteoarthritis of knee: Secondary | ICD-10-CM | POA: Diagnosis not present

## 2023-09-18 DIAGNOSIS — M170 Bilateral primary osteoarthritis of knee: Secondary | ICD-10-CM | POA: Diagnosis not present

## 2023-09-19 DIAGNOSIS — M170 Bilateral primary osteoarthritis of knee: Secondary | ICD-10-CM | POA: Diagnosis not present

## 2023-09-20 DIAGNOSIS — M170 Bilateral primary osteoarthritis of knee: Secondary | ICD-10-CM | POA: Diagnosis not present

## 2023-09-21 DIAGNOSIS — M170 Bilateral primary osteoarthritis of knee: Secondary | ICD-10-CM | POA: Diagnosis not present

## 2023-09-22 DIAGNOSIS — M170 Bilateral primary osteoarthritis of knee: Secondary | ICD-10-CM | POA: Diagnosis not present

## 2023-09-23 DIAGNOSIS — M170 Bilateral primary osteoarthritis of knee: Secondary | ICD-10-CM | POA: Diagnosis not present

## 2023-09-24 DIAGNOSIS — M170 Bilateral primary osteoarthritis of knee: Secondary | ICD-10-CM | POA: Diagnosis not present

## 2023-09-25 DIAGNOSIS — M170 Bilateral primary osteoarthritis of knee: Secondary | ICD-10-CM | POA: Diagnosis not present

## 2023-09-26 DIAGNOSIS — M170 Bilateral primary osteoarthritis of knee: Secondary | ICD-10-CM | POA: Diagnosis not present

## 2023-09-27 DIAGNOSIS — M170 Bilateral primary osteoarthritis of knee: Secondary | ICD-10-CM | POA: Diagnosis not present

## 2023-09-28 DIAGNOSIS — M170 Bilateral primary osteoarthritis of knee: Secondary | ICD-10-CM | POA: Diagnosis not present

## 2023-09-29 DIAGNOSIS — M170 Bilateral primary osteoarthritis of knee: Secondary | ICD-10-CM | POA: Diagnosis not present

## 2023-09-30 DIAGNOSIS — M170 Bilateral primary osteoarthritis of knee: Secondary | ICD-10-CM | POA: Diagnosis not present

## 2023-10-01 DIAGNOSIS — M170 Bilateral primary osteoarthritis of knee: Secondary | ICD-10-CM | POA: Diagnosis not present

## 2023-10-02 DIAGNOSIS — M170 Bilateral primary osteoarthritis of knee: Secondary | ICD-10-CM | POA: Diagnosis not present

## 2023-10-03 DIAGNOSIS — M170 Bilateral primary osteoarthritis of knee: Secondary | ICD-10-CM | POA: Diagnosis not present

## 2023-10-04 DIAGNOSIS — M170 Bilateral primary osteoarthritis of knee: Secondary | ICD-10-CM | POA: Diagnosis not present

## 2023-10-05 DIAGNOSIS — M170 Bilateral primary osteoarthritis of knee: Secondary | ICD-10-CM | POA: Diagnosis not present

## 2023-10-06 DIAGNOSIS — M170 Bilateral primary osteoarthritis of knee: Secondary | ICD-10-CM | POA: Diagnosis not present

## 2023-10-07 DIAGNOSIS — M170 Bilateral primary osteoarthritis of knee: Secondary | ICD-10-CM | POA: Diagnosis not present

## 2023-10-08 DIAGNOSIS — M170 Bilateral primary osteoarthritis of knee: Secondary | ICD-10-CM | POA: Diagnosis not present

## 2023-10-09 DIAGNOSIS — M170 Bilateral primary osteoarthritis of knee: Secondary | ICD-10-CM | POA: Diagnosis not present

## 2023-10-10 DIAGNOSIS — M170 Bilateral primary osteoarthritis of knee: Secondary | ICD-10-CM | POA: Diagnosis not present

## 2023-10-11 DIAGNOSIS — M170 Bilateral primary osteoarthritis of knee: Secondary | ICD-10-CM | POA: Diagnosis not present

## 2023-10-12 DIAGNOSIS — M170 Bilateral primary osteoarthritis of knee: Secondary | ICD-10-CM | POA: Diagnosis not present

## 2023-10-13 DIAGNOSIS — M170 Bilateral primary osteoarthritis of knee: Secondary | ICD-10-CM | POA: Diagnosis not present

## 2023-10-14 DIAGNOSIS — M170 Bilateral primary osteoarthritis of knee: Secondary | ICD-10-CM | POA: Diagnosis not present

## 2023-10-15 DIAGNOSIS — M170 Bilateral primary osteoarthritis of knee: Secondary | ICD-10-CM | POA: Diagnosis not present

## 2023-10-16 DIAGNOSIS — M170 Bilateral primary osteoarthritis of knee: Secondary | ICD-10-CM | POA: Diagnosis not present

## 2023-10-17 DIAGNOSIS — M170 Bilateral primary osteoarthritis of knee: Secondary | ICD-10-CM | POA: Diagnosis not present

## 2023-10-18 ENCOUNTER — Other Ambulatory Visit: Payer: Self-pay

## 2023-10-18 ENCOUNTER — Emergency Department
Admission: EM | Admit: 2023-10-18 | Discharge: 2023-10-18 | Disposition: A | Discharge status: Home / Self Care | Attending: Emergency Medicine | Admitting: Emergency Medicine

## 2023-10-18 DIAGNOSIS — J0100 Acute maxillary sinusitis, unspecified: Secondary | ICD-10-CM | POA: Insufficient documentation

## 2023-10-18 DIAGNOSIS — I10 Essential (primary) hypertension: Secondary | ICD-10-CM | POA: Diagnosis not present

## 2023-10-18 DIAGNOSIS — R42 Dizziness and giddiness: Secondary | ICD-10-CM | POA: Diagnosis not present

## 2023-10-18 DIAGNOSIS — Z21 Asymptomatic human immunodeficiency virus [HIV] infection status: Secondary | ICD-10-CM | POA: Diagnosis not present

## 2023-10-18 DIAGNOSIS — R911 Solitary pulmonary nodule: Secondary | ICD-10-CM | POA: Diagnosis not present

## 2023-10-18 DIAGNOSIS — E860 Dehydration: Secondary | ICD-10-CM | POA: Insufficient documentation

## 2023-10-18 DIAGNOSIS — J45909 Unspecified asthma, uncomplicated: Secondary | ICD-10-CM | POA: Insufficient documentation

## 2023-10-18 DIAGNOSIS — R918 Other nonspecific abnormal finding of lung field: Secondary | ICD-10-CM | POA: Diagnosis not present

## 2023-10-18 DIAGNOSIS — M170 Bilateral primary osteoarthritis of knee: Secondary | ICD-10-CM | POA: Diagnosis not present

## 2023-10-18 LAB — URINALYSIS, ROUTINE W REFLEX MICROSCOPIC
Bilirubin Urine: NEGATIVE
Glucose, UA: NEGATIVE mg/dL
Hgb urine dipstick: NEGATIVE
Ketones, ur: NEGATIVE mg/dL
Leukocytes,Ua: NEGATIVE
Nitrite: NEGATIVE
Protein, ur: NEGATIVE mg/dL
Specific Gravity, Urine: 1.019 (ref 1.005–1.030)
pH: 8 (ref 5.0–8.0)

## 2023-10-18 LAB — CBC
HCT: 42.7 % (ref 36.0–46.0)
Hemoglobin: 13.4 g/dL (ref 12.0–15.0)
MCH: 30 pg (ref 26.0–34.0)
MCHC: 31.4 g/dL (ref 30.0–36.0)
MCV: 95.7 fL (ref 80.0–100.0)
Platelets: 196 10*3/uL (ref 150–400)
RBC: 4.46 MIL/uL (ref 3.87–5.11)
RDW: 12.9 % (ref 11.5–15.5)
WBC: 4.5 10*3/uL (ref 4.0–10.5)
nRBC: 0 % (ref 0.0–0.2)

## 2023-10-18 LAB — COMPREHENSIVE METABOLIC PANEL WITH GFR
ALT: 20 U/L (ref 0–44)
AST: 23 U/L (ref 15–41)
Albumin: 3.9 g/dL (ref 3.5–5.0)
Alkaline Phosphatase: 75 U/L (ref 38–126)
Anion gap: 9 (ref 5–15)
BUN: 23 mg/dL — ABNORMAL HIGH (ref 6–20)
CO2: 23 mmol/L (ref 22–32)
Calcium: 9.3 mg/dL (ref 8.9–10.3)
Chloride: 106 mmol/L (ref 98–111)
Creatinine, Ser: 0.85 mg/dL (ref 0.44–1.00)
GFR, Estimated: >60 mL/min (ref >60)
Glucose, Bld: 106 mg/dL — ABNORMAL HIGH (ref 70–99)
Potassium: 4 mmol/L (ref 3.5–5.1)
Sodium: 138 mmol/L (ref 135–145)
Total Bilirubin: 0.4 mg/dL (ref 0.0–1.2)
Total Protein: 6.9 g/dL (ref 6.5–8.1)

## 2023-10-18 MED ORDER — ENALAPRIL MALEATE 20 MG PO TABS: 20.0000 mg | ORAL_TABLET | Freq: Two times a day (BID) | ORAL | 2 refills | Status: AC

## 2023-10-18 MED ORDER — OXYCODONE-ACETAMINOPHEN 5-325 MG PO TABS: 1.0000 | ORAL_TABLET | Freq: Four times a day (QID) | ORAL | 0 refills | Status: AC | PRN

## 2023-10-18 MED ORDER — MORPHINE SULFATE (PF) 4 MG/ML IV SOLN
4.0000 mg | Freq: Once | INTRAVENOUS | Status: AC
  Administered 2023-10-18: 4 mg via INTRAVENOUS
  Filled 2023-10-18: qty 1

## 2023-10-18 MED ORDER — DOXYCYCLINE HYCLATE 100 MG PO CAPS: 100.0000 mg | ORAL_CAPSULE | Freq: Two times a day (BID) | ORAL | 0 refills | Status: AC

## 2023-10-18 MED ORDER — DEXAMETHASONE SODIUM PHOSPHATE 10 MG/ML IJ SOLN
10.0000 mg | Freq: Once | INTRAMUSCULAR | Status: AC
  Administered 2023-10-18: 10 mg via INTRAVENOUS
  Filled 2023-10-18: qty 1

## 2023-10-18 MED ORDER — SODIUM CHLORIDE 0.9 % IV BOLUS
1000.0000 mL | Freq: Once | INTRAVENOUS | Status: AC
  Administered 2023-10-18: 1000 mL via INTRAVENOUS

## 2023-10-18 MED ORDER — KETOROLAC TROMETHAMINE 15 MG/ML IJ SOLN
15.0000 mg | Freq: Once | INTRAMUSCULAR | Status: AC
  Administered 2023-10-18: 15 mg via INTRAVENOUS
  Filled 2023-10-18: qty 1

## 2023-10-18 NOTE — ED Notes (Addendum)
 Pt states she passed out last night. Continues to have dizziness and a headache. Pt made aware that we need a urine sample.

## 2023-10-18 NOTE — ED Provider Notes (Signed)
 [facility] Provider Note    Event Date/Time   First MD Initiated Contact with Patient 10/18/23 1145     (approximate)   History   Chief Complaint: Near Syncope   HPI  Diane Castillo is a 53 y.o. female with history of asthma, DVT, hypertension, HIV who comes ED complaining of lightheadedness and fatigue for the past 2 days.  Also complains of facial congestion and pain over the maxillary sinuses particular on the left.  Has cough, sore throat, left-sided earache.  No chest pain or shortness of breath.  Has been drinking water but has had poor appetite and decreased oral intake overall for the last 2 days.  Patient notes that she has been having some increased congestion for the last week to 10 days which she attributes to seasonal allergies, but it has become particularly worsened in the last 2 or 3 days.      Past Medical History:  Diagnosis Date   Asthma    DVT (deep venous thrombosis) (HCC)    right leg   Hypertension    Immune deficiency disorder [facility])     Current Outpatient Rx   Order #: [id number] Class: Normal   Order #: [id number] Class: Normal   Order #: [id number] Class: Normal   Order #: [id number] Class: Historical Med   Order #: [id number] Class: Historical Med   Order #: [id number] Class: Historical Med   Order #: [id number] Class: Historical Med   Order #: [id number] Class: Historical Med   Order #: [id number] Class: Normal   Order #: [id number] Class: Normal   Order #: [id number] Class: Historical Med   Order #: [id number] Class: Historical Med   Order #: [id number] Class: Historical Med   Order #: [id number] Class: Historical Med   Order #: [id number] Class: Historical Med   Order #: [id number] Class: Historical Med   Order #: [id number] Class: Print   Order #: [id number] Class: Historical Med   Order #: [id number] Class: Historical Med    Past Surgical History:  Procedure Laterality Date   ABDOMINAL HYSTERECTOMY     CHOLECYSTECTOMY     HAND SURGERY      HERNIA REPAIR  2014   ventral   INCISION AND DRAINAGE ABSCESS N/A 10/23/2022   Procedure: INCISION AND DRAINAGE RECTAL ABSCESS;  Surgeon: Eldred Grego, MD;  Location: [facility] ORS;  Service: General;  Laterality: N/A;   KNEE CLOSED REDUCTION Right 12/06/2016   Procedure: CLOSED MANIPULATION KNEE;  Surgeon: Molli Angelucci, MD;  Location: [facility] ORS;  Service: Orthopedics;  Laterality: Right;   KNEE SURGERY     ORIF FINGER FRACTURE Left    middle finger, metal placed   TUBAL LIGATION      Physical Exam   Triage Vital Signs: ED Triage Vitals  Encounter Vitals Group     BP 10/18/23 1054 (!) 159/121     Systolic BP Percentile --      Diastolic BP Percentile --      Pulse Rate 10/18/23 1054 62     Resp 10/18/23 1054 18     Temp 10/18/23 1054 98.3 F (36.8 C)     Temp Source 10/18/23 1054 Oral     SpO2 10/18/23 1054 100 %     Weight 10/18/23 1055 202 lb (91.6 kg)     Height 10/18/23 1055 5\' 5"  (1.651 m)     Head Circumference --      Peak Flow --      Pain Score 10/18/23 1055 9     Pain Loc --      Pain Education --  Exclude from Growth Chart --     Most recent vital signs: Vitals:   10/18/23 1459 10/18/23 1600  BP:  (!) 179/112  Pulse:  60  Resp:  18  Temp: 98.3 F (36.8 C)   SpO2:  99%    General: Awake, no distress.  CV:  Good peripheral perfusion.  Regular rate rhythm Resp:  Normal effort.  Clear to auscultation bilaterally Abd:  No distention.  Soft nontender Other:  TMs and external canals are normal.  No mastoid tenderness to percussion.  There is boggy turbinates bilaterally with discharge on the left.  Tenderness to percussion over the left maxillary sinus.  There is pharyngeal erythema without uvular or tonsillar edema or asymmetry.  No tongue elevation.  No cervical lymphadenopathy.   ED Results / Procedures / Treatments   Labs (all labs ordered are listed, but only abnormal results are displayed) Labs Reviewed  COMPREHENSIVE METABOLIC PANEL WITH GFR  - Abnormal; Notable for the following components:      Result Value   Glucose, Bld 106 (*)    BUN 23 (*)    All other components within normal limits  URINALYSIS, ROUTINE W REFLEX MICROSCOPIC - Abnormal; Notable for the following components:   Color, Urine YELLOW (*)    APPearance HAZY (*)    All other components within normal limits  CBC  CBG MONITORING, ED     EKG Interpreted by me Sinus rhythm rate of 60.  Normal axis and intervals.  Normal QRS ST segments and T waves   RADIOLOGY    PROCEDURES:  Procedures   MEDICATIONS ORDERED IN ED: Medications  sodium chloride  0.9 % bolus 1,000 mL (0 mLs Intravenous Stopped 10/18/23 1432)  ketorolac  (TORADOL ) 15 MG/ML injection 15 mg (15 mg Intravenous Given 10/18/23 1210)  dexamethasone  (DECADRON ) injection 10 mg (10 mg Intravenous Given 10/18/23 1211)  sodium chloride  0.9 % bolus 1,000 mL (0 mLs Intravenous Stopped 10/18/23 1703)  morphine  (PF) 4 MG/ML injection 4 mg (4 mg Intravenous Given 10/18/23 1528)     IMPRESSION / MDM / ASSESSMENT AND PLAN / ED COURSE  I reviewed the triage vital signs and the nursing notes.  DDx: Allergic rhinitis, maxillary sinusitis, dehydration, anemia, electrolyte derangement, viral URI  Patient's presentation is most consistent with acute presentation with potential threat to life or bodily function.  Patient presents with upper respiratory congestion and inflammation in the nasal passages and maxillary sinuses.  Clinically I suspect maxillary sinusitis, and with her history of HIV we will plan to start Augmentin.  Think lightheadedness is due to decreased oral intake and dehydration, will give IV fluids and check orthostatics.  Serum labs are unremarkable, vital signs reassuring, nontoxic.  Doubt ACS PE dissection or CVA.  Will give anti-inflammatories and IV fluids.   Clinical Course as of 10/18/23 1720  Fri Oct 18, 2023  1523 Orthostatics showed a mild decrease in blood pressure with standing  although not hypotensive.  Patient reports she was still symptomatic.  Will give additional fluids along with some IV morphine  for her continued facial pain. [PS]    Clinical Course User Index [PS] Jacquie Maudlin, MD    ----------------------------------------- 5:19 PM on 10/18/2023 ----------------------------------------- Feeling better, sitting upright and tolerating oral intake.  Reports she is only on enalapril  20 mg once a day now, losartan has been discontinued.  With uncontrolled hypertension, will increase enalapril  to max dose 20 mg twice daily.  She will follow-up with her primary care doctor for further blood pressure  monitoring and medication adjustment.  Penicillin allergy, will give doxycycline for sinusitis.    FINAL CLINICAL IMPRESSION(S) / ED DIAGNOSES   Final diagnoses:  Dehydration  Dizziness  Acute non-recurrent maxillary sinusitis  Uncontrolled hypertension  HIV infection, unspecified symptom status (HCC)     Rx / DC Orders   ED Discharge Orders          Ordered    oxyCODONE -acetaminophen  (PERCOCET) 5-325 MG tablet  Every 6 hours PRN        10/18/23 1718    enalapril  (VASOTEC ) 20 MG tablet  2 times daily        10/18/23 1718    doxycycline (VIBRAMYCIN) 100 MG capsule  2 times daily        10/18/23 1718             Note:  This document was prepared using Dragon voice recognition software and may include unintentional dictation errors.   Jacquie Maudlin, MD 10/18/23 [phone]

## 2023-10-18 NOTE — ED Triage Notes (Signed)
 Patient comes in via pov after having a near syncopy. Pt states that she passed out last night, abd feels like she is going to pass out this morning. Pt says that she starts feeling dizzy before she passes out. Pt is alert and oriented x4, and says that she feels generalized weakness off and on. Pt ambulatory to triage, and states that she feels like her head is spinning.

## 2023-10-18 NOTE — ED Notes (Signed)
 Followed up to see if patient could urinate. Pt states she went to the bathroom and forgot to give a urine sample. Explained to patient to let us  know when she has to urinate again.

## 2023-10-19 DIAGNOSIS — M170 Bilateral primary osteoarthritis of knee: Secondary | ICD-10-CM | POA: Diagnosis not present

## 2023-10-20 DIAGNOSIS — M170 Bilateral primary osteoarthritis of knee: Secondary | ICD-10-CM | POA: Diagnosis not present

## 2023-10-21 DIAGNOSIS — M170 Bilateral primary osteoarthritis of knee: Secondary | ICD-10-CM | POA: Diagnosis not present

## 2023-10-22 DIAGNOSIS — M170 Bilateral primary osteoarthritis of knee: Secondary | ICD-10-CM | POA: Diagnosis not present

## 2023-10-23 DIAGNOSIS — M170 Bilateral primary osteoarthritis of knee: Secondary | ICD-10-CM | POA: Diagnosis not present

## 2023-10-24 DIAGNOSIS — M170 Bilateral primary osteoarthritis of knee: Secondary | ICD-10-CM | POA: Diagnosis not present

## 2023-10-25 DIAGNOSIS — M170 Bilateral primary osteoarthritis of knee: Secondary | ICD-10-CM | POA: Diagnosis not present

## 2023-10-26 DIAGNOSIS — M170 Bilateral primary osteoarthritis of knee: Secondary | ICD-10-CM | POA: Diagnosis not present

## 2023-10-27 DIAGNOSIS — M170 Bilateral primary osteoarthritis of knee: Secondary | ICD-10-CM | POA: Diagnosis not present

## 2023-10-28 DIAGNOSIS — M170 Bilateral primary osteoarthritis of knee: Secondary | ICD-10-CM | POA: Diagnosis not present

## 2023-10-29 DIAGNOSIS — M170 Bilateral primary osteoarthritis of knee: Secondary | ICD-10-CM | POA: Diagnosis not present

## 2023-10-30 DIAGNOSIS — M170 Bilateral primary osteoarthritis of knee: Secondary | ICD-10-CM | POA: Diagnosis not present

## 2023-10-31 DIAGNOSIS — M170 Bilateral primary osteoarthritis of knee: Secondary | ICD-10-CM | POA: Diagnosis not present

## 2023-11-01 DIAGNOSIS — M170 Bilateral primary osteoarthritis of knee: Secondary | ICD-10-CM | POA: Diagnosis not present

## 2023-11-02 DIAGNOSIS — M170 Bilateral primary osteoarthritis of knee: Secondary | ICD-10-CM | POA: Diagnosis not present

## 2023-11-03 DIAGNOSIS — M170 Bilateral primary osteoarthritis of knee: Secondary | ICD-10-CM | POA: Diagnosis not present

## 2023-11-04 DIAGNOSIS — M170 Bilateral primary osteoarthritis of knee: Secondary | ICD-10-CM | POA: Diagnosis not present

## 2023-11-05 DIAGNOSIS — M170 Bilateral primary osteoarthritis of knee: Secondary | ICD-10-CM | POA: Diagnosis not present

## 2023-11-06 DIAGNOSIS — M170 Bilateral primary osteoarthritis of knee: Secondary | ICD-10-CM | POA: Diagnosis not present

## 2023-11-07 DIAGNOSIS — M170 Bilateral primary osteoarthritis of knee: Secondary | ICD-10-CM | POA: Diagnosis not present

## 2023-11-08 DIAGNOSIS — M170 Bilateral primary osteoarthritis of knee: Secondary | ICD-10-CM | POA: Diagnosis not present

## 2023-11-09 DIAGNOSIS — M170 Bilateral primary osteoarthritis of knee: Secondary | ICD-10-CM | POA: Diagnosis not present

## 2023-11-10 DIAGNOSIS — M170 Bilateral primary osteoarthritis of knee: Secondary | ICD-10-CM | POA: Diagnosis not present

## 2023-11-11 DIAGNOSIS — M170 Bilateral primary osteoarthritis of knee: Secondary | ICD-10-CM | POA: Diagnosis not present

## 2023-11-12 DIAGNOSIS — M170 Bilateral primary osteoarthritis of knee: Secondary | ICD-10-CM | POA: Diagnosis not present

## 2023-11-13 DIAGNOSIS — J329 Chronic sinusitis, unspecified: Secondary | ICD-10-CM | POA: Diagnosis not present

## 2023-11-13 DIAGNOSIS — J4540 Moderate persistent asthma, uncomplicated: Secondary | ICD-10-CM | POA: Diagnosis not present

## 2023-11-13 DIAGNOSIS — M170 Bilateral primary osteoarthritis of knee: Secondary | ICD-10-CM | POA: Diagnosis not present

## 2023-11-14 DIAGNOSIS — M170 Bilateral primary osteoarthritis of knee: Secondary | ICD-10-CM | POA: Diagnosis not present

## 2023-11-15 DIAGNOSIS — M170 Bilateral primary osteoarthritis of knee: Secondary | ICD-10-CM | POA: Diagnosis not present

## 2023-11-16 DIAGNOSIS — M170 Bilateral primary osteoarthritis of knee: Secondary | ICD-10-CM | POA: Diagnosis not present

## 2023-11-17 DIAGNOSIS — M170 Bilateral primary osteoarthritis of knee: Secondary | ICD-10-CM | POA: Diagnosis not present

## 2023-11-18 DIAGNOSIS — M170 Bilateral primary osteoarthritis of knee: Secondary | ICD-10-CM | POA: Diagnosis not present

## 2023-11-19 DIAGNOSIS — M170 Bilateral primary osteoarthritis of knee: Secondary | ICD-10-CM | POA: Diagnosis not present

## 2023-11-20 DIAGNOSIS — M170 Bilateral primary osteoarthritis of knee: Secondary | ICD-10-CM | POA: Diagnosis not present

## 2023-11-21 DIAGNOSIS — M170 Bilateral primary osteoarthritis of knee: Secondary | ICD-10-CM | POA: Diagnosis not present

## 2023-11-22 DIAGNOSIS — M170 Bilateral primary osteoarthritis of knee: Secondary | ICD-10-CM | POA: Diagnosis not present

## 2023-11-23 DIAGNOSIS — M170 Bilateral primary osteoarthritis of knee: Secondary | ICD-10-CM | POA: Diagnosis not present

## 2023-11-24 DIAGNOSIS — M170 Bilateral primary osteoarthritis of knee: Secondary | ICD-10-CM | POA: Diagnosis not present

## 2023-11-25 DIAGNOSIS — M170 Bilateral primary osteoarthritis of knee: Secondary | ICD-10-CM | POA: Diagnosis not present

## 2023-11-26 DIAGNOSIS — M170 Bilateral primary osteoarthritis of knee: Secondary | ICD-10-CM | POA: Diagnosis not present

## 2023-11-27 DIAGNOSIS — M170 Bilateral primary osteoarthritis of knee: Secondary | ICD-10-CM | POA: Diagnosis not present

## 2023-11-28 DIAGNOSIS — M170 Bilateral primary osteoarthritis of knee: Secondary | ICD-10-CM | POA: Diagnosis not present

## 2023-11-29 DIAGNOSIS — M170 Bilateral primary osteoarthritis of knee: Secondary | ICD-10-CM | POA: Diagnosis not present

## 2023-11-30 DIAGNOSIS — M170 Bilateral primary osteoarthritis of knee: Secondary | ICD-10-CM | POA: Diagnosis not present

## 2023-12-01 DIAGNOSIS — M170 Bilateral primary osteoarthritis of knee: Secondary | ICD-10-CM | POA: Diagnosis not present

## 2023-12-02 DIAGNOSIS — M170 Bilateral primary osteoarthritis of knee: Secondary | ICD-10-CM | POA: Diagnosis not present

## 2023-12-03 DIAGNOSIS — M170 Bilateral primary osteoarthritis of knee: Secondary | ICD-10-CM | POA: Diagnosis not present

## 2023-12-04 DIAGNOSIS — M170 Bilateral primary osteoarthritis of knee: Secondary | ICD-10-CM | POA: Diagnosis not present

## 2023-12-05 DIAGNOSIS — M170 Bilateral primary osteoarthritis of knee: Secondary | ICD-10-CM | POA: Diagnosis not present

## 2023-12-06 DIAGNOSIS — M170 Bilateral primary osteoarthritis of knee: Secondary | ICD-10-CM | POA: Diagnosis not present

## 2023-12-07 DIAGNOSIS — M170 Bilateral primary osteoarthritis of knee: Secondary | ICD-10-CM | POA: Diagnosis not present

## 2023-12-08 DIAGNOSIS — M170 Bilateral primary osteoarthritis of knee: Secondary | ICD-10-CM | POA: Diagnosis not present

## 2023-12-09 DIAGNOSIS — M170 Bilateral primary osteoarthritis of knee: Secondary | ICD-10-CM | POA: Diagnosis not present

## 2023-12-10 DIAGNOSIS — M170 Bilateral primary osteoarthritis of knee: Secondary | ICD-10-CM | POA: Diagnosis not present

## 2023-12-11 DIAGNOSIS — M170 Bilateral primary osteoarthritis of knee: Secondary | ICD-10-CM | POA: Diagnosis not present

## 2023-12-12 DIAGNOSIS — S63610A Unspecified sprain of right index finger, initial encounter: Secondary | ICD-10-CM | POA: Diagnosis not present

## 2023-12-12 DIAGNOSIS — S8002XA Contusion of left knee, initial encounter: Secondary | ICD-10-CM | POA: Diagnosis not present

## 2023-12-12 DIAGNOSIS — M170 Bilateral primary osteoarthritis of knee: Secondary | ICD-10-CM | POA: Diagnosis not present

## 2023-12-12 DIAGNOSIS — W010XXA Fall on same level from slipping, tripping and stumbling without subsequent striking against object, initial encounter: Secondary | ICD-10-CM | POA: Diagnosis not present

## 2023-12-13 DIAGNOSIS — M170 Bilateral primary osteoarthritis of knee: Secondary | ICD-10-CM | POA: Diagnosis not present

## 2023-12-14 DIAGNOSIS — M170 Bilateral primary osteoarthritis of knee: Secondary | ICD-10-CM | POA: Diagnosis not present

## 2023-12-15 DIAGNOSIS — M170 Bilateral primary osteoarthritis of knee: Secondary | ICD-10-CM | POA: Diagnosis not present

## 2023-12-16 DIAGNOSIS — M79644 Pain in right finger(s): Secondary | ICD-10-CM | POA: Diagnosis not present

## 2023-12-16 DIAGNOSIS — M170 Bilateral primary osteoarthritis of knee: Secondary | ICD-10-CM | POA: Diagnosis not present

## 2023-12-16 DIAGNOSIS — M25741 Osteophyte, right hand: Secondary | ICD-10-CM | POA: Diagnosis not present

## 2023-12-17 DIAGNOSIS — M170 Bilateral primary osteoarthritis of knee: Secondary | ICD-10-CM | POA: Diagnosis not present

## 2023-12-18 DIAGNOSIS — M170 Bilateral primary osteoarthritis of knee: Secondary | ICD-10-CM | POA: Diagnosis not present

## 2023-12-19 DIAGNOSIS — M170 Bilateral primary osteoarthritis of knee: Secondary | ICD-10-CM | POA: Diagnosis not present

## 2023-12-20 DIAGNOSIS — M170 Bilateral primary osteoarthritis of knee: Secondary | ICD-10-CM | POA: Diagnosis not present

## 2023-12-21 DIAGNOSIS — M170 Bilateral primary osteoarthritis of knee: Secondary | ICD-10-CM | POA: Diagnosis not present

## 2023-12-22 DIAGNOSIS — M170 Bilateral primary osteoarthritis of knee: Secondary | ICD-10-CM | POA: Diagnosis not present

## 2023-12-23 DIAGNOSIS — M170 Bilateral primary osteoarthritis of knee: Secondary | ICD-10-CM | POA: Diagnosis not present

## 2023-12-24 DIAGNOSIS — M170 Bilateral primary osteoarthritis of knee: Secondary | ICD-10-CM | POA: Diagnosis not present

## 2023-12-25 DIAGNOSIS — G8929 Other chronic pain: Secondary | ICD-10-CM | POA: Diagnosis not present

## 2023-12-25 DIAGNOSIS — M19041 Primary osteoarthritis, right hand: Secondary | ICD-10-CM | POA: Diagnosis not present

## 2023-12-25 DIAGNOSIS — M7989 Other specified soft tissue disorders: Secondary | ICD-10-CM | POA: Diagnosis not present

## 2023-12-25 DIAGNOSIS — Z6839 Body mass index (BMI) 39.0-39.9, adult: Secondary | ICD-10-CM | POA: Diagnosis not present

## 2023-12-25 DIAGNOSIS — S62305D Unspecified fracture of fourth metacarpal bone, left hand, subsequent encounter for fracture with routine healing: Secondary | ICD-10-CM | POA: Diagnosis not present

## 2023-12-25 DIAGNOSIS — M170 Bilateral primary osteoarthritis of knee: Secondary | ICD-10-CM | POA: Diagnosis not present

## 2023-12-25 DIAGNOSIS — M79641 Pain in right hand: Secondary | ICD-10-CM | POA: Diagnosis not present

## 2023-12-25 DIAGNOSIS — E66812 Obesity, class 2: Secondary | ICD-10-CM | POA: Diagnosis not present

## 2023-12-26 DIAGNOSIS — M170 Bilateral primary osteoarthritis of knee: Secondary | ICD-10-CM | POA: Diagnosis not present

## 2023-12-27 DIAGNOSIS — M170 Bilateral primary osteoarthritis of knee: Secondary | ICD-10-CM | POA: Diagnosis not present

## 2023-12-28 DIAGNOSIS — M170 Bilateral primary osteoarthritis of knee: Secondary | ICD-10-CM | POA: Diagnosis not present

## 2023-12-29 DIAGNOSIS — M170 Bilateral primary osteoarthritis of knee: Secondary | ICD-10-CM | POA: Diagnosis not present

## 2023-12-30 DIAGNOSIS — M170 Bilateral primary osteoarthritis of knee: Secondary | ICD-10-CM | POA: Diagnosis not present

## 2023-12-31 DIAGNOSIS — M170 Bilateral primary osteoarthritis of knee: Secondary | ICD-10-CM | POA: Diagnosis not present

## 2024-01-01 DIAGNOSIS — M170 Bilateral primary osteoarthritis of knee: Secondary | ICD-10-CM | POA: Diagnosis not present

## 2024-01-02 DIAGNOSIS — M170 Bilateral primary osteoarthritis of knee: Secondary | ICD-10-CM | POA: Diagnosis not present

## 2024-01-03 DIAGNOSIS — M170 Bilateral primary osteoarthritis of knee: Secondary | ICD-10-CM | POA: Diagnosis not present

## 2024-01-04 DIAGNOSIS — M170 Bilateral primary osteoarthritis of knee: Secondary | ICD-10-CM | POA: Diagnosis not present

## 2024-01-05 DIAGNOSIS — M170 Bilateral primary osteoarthritis of knee: Secondary | ICD-10-CM | POA: Diagnosis not present

## 2024-01-06 DIAGNOSIS — M170 Bilateral primary osteoarthritis of knee: Secondary | ICD-10-CM | POA: Diagnosis not present

## 2024-01-07 DIAGNOSIS — M170 Bilateral primary osteoarthritis of knee: Secondary | ICD-10-CM | POA: Diagnosis not present

## 2024-01-08 DIAGNOSIS — M170 Bilateral primary osteoarthritis of knee: Secondary | ICD-10-CM | POA: Diagnosis not present

## 2024-01-09 DIAGNOSIS — M170 Bilateral primary osteoarthritis of knee: Secondary | ICD-10-CM | POA: Diagnosis not present

## 2024-01-10 DIAGNOSIS — M170 Bilateral primary osteoarthritis of knee: Secondary | ICD-10-CM | POA: Diagnosis not present

## 2024-01-11 DIAGNOSIS — M170 Bilateral primary osteoarthritis of knee: Secondary | ICD-10-CM | POA: Diagnosis not present

## 2024-01-12 DIAGNOSIS — M170 Bilateral primary osteoarthritis of knee: Secondary | ICD-10-CM | POA: Diagnosis not present

## 2024-01-13 DIAGNOSIS — M170 Bilateral primary osteoarthritis of knee: Secondary | ICD-10-CM | POA: Diagnosis not present

## 2024-01-14 DIAGNOSIS — M170 Bilateral primary osteoarthritis of knee: Secondary | ICD-10-CM | POA: Diagnosis not present

## 2024-01-14 DIAGNOSIS — W57XXXA Bitten or stung by nonvenomous insect and other nonvenomous arthropods, initial encounter: Secondary | ICD-10-CM | POA: Diagnosis not present

## 2024-01-14 DIAGNOSIS — R21 Rash and other nonspecific skin eruption: Secondary | ICD-10-CM | POA: Diagnosis not present

## 2024-01-15 DIAGNOSIS — M170 Bilateral primary osteoarthritis of knee: Secondary | ICD-10-CM | POA: Diagnosis not present

## 2024-01-16 DIAGNOSIS — M170 Bilateral primary osteoarthritis of knee: Secondary | ICD-10-CM | POA: Diagnosis not present

## 2024-01-17 DIAGNOSIS — M170 Bilateral primary osteoarthritis of knee: Secondary | ICD-10-CM | POA: Diagnosis not present

## 2024-01-18 DIAGNOSIS — M170 Bilateral primary osteoarthritis of knee: Secondary | ICD-10-CM | POA: Diagnosis not present

## 2024-01-19 DIAGNOSIS — M170 Bilateral primary osteoarthritis of knee: Secondary | ICD-10-CM | POA: Diagnosis not present

## 2024-01-20 DIAGNOSIS — M170 Bilateral primary osteoarthritis of knee: Secondary | ICD-10-CM | POA: Diagnosis not present

## 2024-01-21 DIAGNOSIS — M170 Bilateral primary osteoarthritis of knee: Secondary | ICD-10-CM | POA: Diagnosis not present

## 2024-01-22 DIAGNOSIS — M170 Bilateral primary osteoarthritis of knee: Secondary | ICD-10-CM | POA: Diagnosis not present

## 2024-01-23 DIAGNOSIS — M170 Bilateral primary osteoarthritis of knee: Secondary | ICD-10-CM | POA: Diagnosis not present

## 2024-01-24 DIAGNOSIS — M170 Bilateral primary osteoarthritis of knee: Secondary | ICD-10-CM | POA: Diagnosis not present

## 2024-01-25 DIAGNOSIS — M170 Bilateral primary osteoarthritis of knee: Secondary | ICD-10-CM | POA: Diagnosis not present

## 2024-01-26 DIAGNOSIS — M170 Bilateral primary osteoarthritis of knee: Secondary | ICD-10-CM | POA: Diagnosis not present

## 2024-01-27 DIAGNOSIS — M170 Bilateral primary osteoarthritis of knee: Secondary | ICD-10-CM | POA: Diagnosis not present

## 2024-01-28 DIAGNOSIS — M170 Bilateral primary osteoarthritis of knee: Secondary | ICD-10-CM | POA: Diagnosis not present

## 2024-01-28 DIAGNOSIS — X58XXXA Exposure to other specified factors, initial encounter: Secondary | ICD-10-CM | POA: Diagnosis not present

## 2024-01-28 DIAGNOSIS — M25562 Pain in left knee: Secondary | ICD-10-CM | POA: Diagnosis not present

## 2024-01-28 DIAGNOSIS — S8002XA Contusion of left knee, initial encounter: Secondary | ICD-10-CM | POA: Diagnosis not present

## 2024-01-28 DIAGNOSIS — M25462 Effusion, left knee: Secondary | ICD-10-CM | POA: Diagnosis not present

## 2024-01-29 DIAGNOSIS — M170 Bilateral primary osteoarthritis of knee: Secondary | ICD-10-CM | POA: Diagnosis not present

## 2024-01-30 ENCOUNTER — Telehealth: Payer: Self-pay

## 2024-01-30 DIAGNOSIS — M170 Bilateral primary osteoarthritis of knee: Secondary | ICD-10-CM | POA: Diagnosis not present

## 2024-01-30 NOTE — Progress Notes (Signed)
 Thank you for this referral. The [facility] Health team receives referrals for patients who meet Complex Care Management program criteria: chronic conditions including heart failure, stroke, COPD, ESRD, Sickle Cell, Diabetes with complications, Mental/Behavioral Health diagnosis, substance abuse/misuse and whose Primary Care Provider is a [facility] provider or [facility] contracted Building control surveyor in [place]).  This patient does not have a [facility] or [facility] Primary Care Provider. [facility] cannot directly provide Care Management services to patients who do not have a qualifying Primary Care Provider. Please call us  if you need further clarification at [phone].    Dreama [facility]  [facility], [facility] Health Care Management Assistant Direct Dial: [phone]  Fax: [phone]

## 2024-01-31 DIAGNOSIS — M170 Bilateral primary osteoarthritis of knee: Secondary | ICD-10-CM | POA: Diagnosis not present

## 2024-02-01 DIAGNOSIS — M170 Bilateral primary osteoarthritis of knee: Secondary | ICD-10-CM | POA: Diagnosis not present

## 2024-02-02 DIAGNOSIS — M170 Bilateral primary osteoarthritis of knee: Secondary | ICD-10-CM | POA: Diagnosis not present

## 2024-02-03 DIAGNOSIS — M170 Bilateral primary osteoarthritis of knee: Secondary | ICD-10-CM | POA: Diagnosis not present

## 2024-02-04 DIAGNOSIS — M170 Bilateral primary osteoarthritis of knee: Secondary | ICD-10-CM | POA: Diagnosis not present

## 2024-02-05 DIAGNOSIS — M170 Bilateral primary osteoarthritis of knee: Secondary | ICD-10-CM | POA: Diagnosis not present

## 2024-02-06 DIAGNOSIS — M170 Bilateral primary osteoarthritis of knee: Secondary | ICD-10-CM | POA: Diagnosis not present

## 2024-02-07 DIAGNOSIS — M170 Bilateral primary osteoarthritis of knee: Secondary | ICD-10-CM | POA: Diagnosis not present

## 2024-02-08 DIAGNOSIS — M170 Bilateral primary osteoarthritis of knee: Secondary | ICD-10-CM | POA: Diagnosis not present

## 2024-02-09 DIAGNOSIS — M170 Bilateral primary osteoarthritis of knee: Secondary | ICD-10-CM | POA: Diagnosis not present

## 2024-02-10 DIAGNOSIS — M170 Bilateral primary osteoarthritis of knee: Secondary | ICD-10-CM | POA: Diagnosis not present

## 2024-02-11 DIAGNOSIS — M170 Bilateral primary osteoarthritis of knee: Secondary | ICD-10-CM | POA: Diagnosis not present

## 2024-02-12 DIAGNOSIS — M170 Bilateral primary osteoarthritis of knee: Secondary | ICD-10-CM | POA: Diagnosis not present

## 2024-02-13 DIAGNOSIS — M170 Bilateral primary osteoarthritis of knee: Secondary | ICD-10-CM | POA: Diagnosis not present

## 2024-02-14 DIAGNOSIS — M170 Bilateral primary osteoarthritis of knee: Secondary | ICD-10-CM | POA: Diagnosis not present

## 2024-02-15 DIAGNOSIS — M170 Bilateral primary osteoarthritis of knee: Secondary | ICD-10-CM | POA: Diagnosis not present

## 2024-02-16 DIAGNOSIS — M170 Bilateral primary osteoarthritis of knee: Secondary | ICD-10-CM | POA: Diagnosis not present

## 2024-02-17 DIAGNOSIS — M170 Bilateral primary osteoarthritis of knee: Secondary | ICD-10-CM | POA: Diagnosis not present

## 2024-02-18 DIAGNOSIS — M170 Bilateral primary osteoarthritis of knee: Secondary | ICD-10-CM | POA: Diagnosis not present

## 2024-02-19 DIAGNOSIS — M170 Bilateral primary osteoarthritis of knee: Secondary | ICD-10-CM | POA: Diagnosis not present

## 2024-02-20 DIAGNOSIS — M170 Bilateral primary osteoarthritis of knee: Secondary | ICD-10-CM | POA: Diagnosis not present

## 2024-02-21 DIAGNOSIS — M170 Bilateral primary osteoarthritis of knee: Secondary | ICD-10-CM | POA: Diagnosis not present

## 2024-02-22 DIAGNOSIS — M170 Bilateral primary osteoarthritis of knee: Secondary | ICD-10-CM | POA: Diagnosis not present

## 2024-02-23 DIAGNOSIS — M170 Bilateral primary osteoarthritis of knee: Secondary | ICD-10-CM | POA: Diagnosis not present

## 2024-02-24 DIAGNOSIS — Z9189 Other specified personal risk factors, not elsewhere classified: Secondary | ICD-10-CM | POA: Diagnosis not present

## 2024-02-24 DIAGNOSIS — M170 Bilateral primary osteoarthritis of knee: Secondary | ICD-10-CM | POA: Diagnosis not present

## 2024-02-24 DIAGNOSIS — Z23 Encounter for immunization: Secondary | ICD-10-CM | POA: Diagnosis not present

## 2024-02-24 DIAGNOSIS — Z13220 Encounter for screening for lipoid disorders: Secondary | ICD-10-CM | POA: Diagnosis not present

## 2024-02-24 DIAGNOSIS — B20 Human immunodeficiency virus [HIV] disease: Secondary | ICD-10-CM | POA: Diagnosis not present

## 2024-02-25 DIAGNOSIS — M170 Bilateral primary osteoarthritis of knee: Secondary | ICD-10-CM | POA: Diagnosis not present

## 2024-02-26 DIAGNOSIS — M170 Bilateral primary osteoarthritis of knee: Secondary | ICD-10-CM | POA: Diagnosis not present

## 2024-02-27 DIAGNOSIS — M170 Bilateral primary osteoarthritis of knee: Secondary | ICD-10-CM | POA: Diagnosis not present

## 2024-02-29 DIAGNOSIS — M170 Bilateral primary osteoarthritis of knee: Secondary | ICD-10-CM | POA: Diagnosis not present

## 2024-03-01 DIAGNOSIS — M170 Bilateral primary osteoarthritis of knee: Secondary | ICD-10-CM | POA: Diagnosis not present

## 2024-03-05 DIAGNOSIS — M170 Bilateral primary osteoarthritis of knee: Secondary | ICD-10-CM | POA: Diagnosis not present

## 2024-03-06 DIAGNOSIS — M170 Bilateral primary osteoarthritis of knee: Secondary | ICD-10-CM | POA: Diagnosis not present

## 2024-03-07 DIAGNOSIS — M170 Bilateral primary osteoarthritis of knee: Secondary | ICD-10-CM | POA: Diagnosis not present

## 2024-03-08 DIAGNOSIS — M170 Bilateral primary osteoarthritis of knee: Secondary | ICD-10-CM | POA: Diagnosis not present

## 2024-03-09 DIAGNOSIS — M170 Bilateral primary osteoarthritis of knee: Secondary | ICD-10-CM | POA: Diagnosis not present

## 2024-03-10 DIAGNOSIS — M170 Bilateral primary osteoarthritis of knee: Secondary | ICD-10-CM | POA: Diagnosis not present

## 2024-03-11 DIAGNOSIS — M170 Bilateral primary osteoarthritis of knee: Secondary | ICD-10-CM | POA: Diagnosis not present

## 2024-03-12 DIAGNOSIS — M170 Bilateral primary osteoarthritis of knee: Secondary | ICD-10-CM | POA: Diagnosis not present

## 2024-03-13 DIAGNOSIS — M170 Bilateral primary osteoarthritis of knee: Secondary | ICD-10-CM | POA: Diagnosis not present

## 2024-03-14 DIAGNOSIS — M170 Bilateral primary osteoarthritis of knee: Secondary | ICD-10-CM | POA: Diagnosis not present

## 2024-03-16 DIAGNOSIS — M170 Bilateral primary osteoarthritis of knee: Secondary | ICD-10-CM | POA: Diagnosis not present

## 2024-03-17 DIAGNOSIS — M170 Bilateral primary osteoarthritis of knee: Secondary | ICD-10-CM | POA: Diagnosis not present

## 2024-03-18 DIAGNOSIS — M170 Bilateral primary osteoarthritis of knee: Secondary | ICD-10-CM | POA: Diagnosis not present

## 2024-03-19 DIAGNOSIS — M170 Bilateral primary osteoarthritis of knee: Secondary | ICD-10-CM | POA: Diagnosis not present

## 2024-03-20 DIAGNOSIS — M170 Bilateral primary osteoarthritis of knee: Secondary | ICD-10-CM | POA: Diagnosis not present

## 2024-03-22 DIAGNOSIS — M170 Bilateral primary osteoarthritis of knee: Secondary | ICD-10-CM | POA: Diagnosis not present

## 2024-03-23 DIAGNOSIS — M170 Bilateral primary osteoarthritis of knee: Secondary | ICD-10-CM | POA: Diagnosis not present

## 2024-03-24 DIAGNOSIS — M170 Bilateral primary osteoarthritis of knee: Secondary | ICD-10-CM | POA: Diagnosis not present

## 2024-03-25 DIAGNOSIS — M170 Bilateral primary osteoarthritis of knee: Secondary | ICD-10-CM | POA: Diagnosis not present

## 2024-03-26 DIAGNOSIS — M170 Bilateral primary osteoarthritis of knee: Secondary | ICD-10-CM | POA: Diagnosis not present

## 2024-03-27 DIAGNOSIS — M170 Bilateral primary osteoarthritis of knee: Secondary | ICD-10-CM | POA: Diagnosis not present

## 2024-03-28 DIAGNOSIS — M170 Bilateral primary osteoarthritis of knee: Secondary | ICD-10-CM | POA: Diagnosis not present

## 2024-03-29 DIAGNOSIS — M170 Bilateral primary osteoarthritis of knee: Secondary | ICD-10-CM | POA: Diagnosis not present

## 2024-03-30 DIAGNOSIS — M170 Bilateral primary osteoarthritis of knee: Secondary | ICD-10-CM | POA: Diagnosis not present

## 2024-03-31 DIAGNOSIS — M170 Bilateral primary osteoarthritis of knee: Secondary | ICD-10-CM | POA: Diagnosis not present

## 2024-04-01 DIAGNOSIS — M170 Bilateral primary osteoarthritis of knee: Secondary | ICD-10-CM | POA: Diagnosis not present

## 2024-04-02 DIAGNOSIS — M170 Bilateral primary osteoarthritis of knee: Secondary | ICD-10-CM | POA: Diagnosis not present

## 2024-04-03 DIAGNOSIS — M170 Bilateral primary osteoarthritis of knee: Secondary | ICD-10-CM | POA: Diagnosis not present

## 2024-04-05 DIAGNOSIS — M170 Bilateral primary osteoarthritis of knee: Secondary | ICD-10-CM | POA: Diagnosis not present

## 2024-04-06 DIAGNOSIS — M170 Bilateral primary osteoarthritis of knee: Secondary | ICD-10-CM | POA: Diagnosis not present

## 2024-04-07 DIAGNOSIS — M170 Bilateral primary osteoarthritis of knee: Secondary | ICD-10-CM | POA: Diagnosis not present

## 2024-04-08 DIAGNOSIS — M170 Bilateral primary osteoarthritis of knee: Secondary | ICD-10-CM | POA: Diagnosis not present

## 2024-04-09 DIAGNOSIS — M170 Bilateral primary osteoarthritis of knee: Secondary | ICD-10-CM | POA: Diagnosis not present

## 2024-04-13 DIAGNOSIS — M170 Bilateral primary osteoarthritis of knee: Secondary | ICD-10-CM | POA: Diagnosis not present

## 2024-04-15 DIAGNOSIS — M170 Bilateral primary osteoarthritis of knee: Secondary | ICD-10-CM | POA: Diagnosis not present

## 2024-04-15 DIAGNOSIS — M542 Cervicalgia: Secondary | ICD-10-CM | POA: Diagnosis not present

## 2024-04-15 DIAGNOSIS — M545 Low back pain, unspecified: Secondary | ICD-10-CM | POA: Diagnosis not present

## 2024-04-15 DIAGNOSIS — S199XXA Unspecified injury of neck, initial encounter: Secondary | ICD-10-CM | POA: Diagnosis not present

## 2024-04-15 DIAGNOSIS — G8911 Acute pain due to trauma: Secondary | ICD-10-CM | POA: Diagnosis not present

## 2024-04-16 DIAGNOSIS — M170 Bilateral primary osteoarthritis of knee: Secondary | ICD-10-CM | POA: Diagnosis not present

## 2024-04-17 DIAGNOSIS — M170 Bilateral primary osteoarthritis of knee: Secondary | ICD-10-CM | POA: Diagnosis not present

## 2024-04-25 DIAGNOSIS — M170 Bilateral primary osteoarthritis of knee: Secondary | ICD-10-CM | POA: Diagnosis not present

## 2024-04-26 DIAGNOSIS — M170 Bilateral primary osteoarthritis of knee: Secondary | ICD-10-CM | POA: Diagnosis not present

## 2024-04-27 DIAGNOSIS — M170 Bilateral primary osteoarthritis of knee: Secondary | ICD-10-CM | POA: Diagnosis not present

## 2024-04-28 DIAGNOSIS — M170 Bilateral primary osteoarthritis of knee: Secondary | ICD-10-CM | POA: Diagnosis not present

## 2024-04-29 DIAGNOSIS — M170 Bilateral primary osteoarthritis of knee: Secondary | ICD-10-CM | POA: Diagnosis not present

## 2024-04-30 DIAGNOSIS — M170 Bilateral primary osteoarthritis of knee: Secondary | ICD-10-CM | POA: Diagnosis not present

## 2024-05-01 DIAGNOSIS — M170 Bilateral primary osteoarthritis of knee: Secondary | ICD-10-CM | POA: Diagnosis not present

## 2024-05-07 DIAGNOSIS — M170 Bilateral primary osteoarthritis of knee: Secondary | ICD-10-CM | POA: Diagnosis not present

## 2024-05-08 DIAGNOSIS — M170 Bilateral primary osteoarthritis of knee: Secondary | ICD-10-CM | POA: Diagnosis not present

## 2024-05-09 DIAGNOSIS — M170 Bilateral primary osteoarthritis of knee: Secondary | ICD-10-CM | POA: Diagnosis not present

## 2024-05-10 DIAGNOSIS — M170 Bilateral primary osteoarthritis of knee: Secondary | ICD-10-CM | POA: Diagnosis not present

## 2024-05-11 DIAGNOSIS — M170 Bilateral primary osteoarthritis of knee: Secondary | ICD-10-CM | POA: Diagnosis not present

## 2024-05-12 DIAGNOSIS — M170 Bilateral primary osteoarthritis of knee: Secondary | ICD-10-CM | POA: Diagnosis not present

## 2024-05-13 DIAGNOSIS — M170 Bilateral primary osteoarthritis of knee: Secondary | ICD-10-CM | POA: Diagnosis not present

## 2024-05-14 DIAGNOSIS — M170 Bilateral primary osteoarthritis of knee: Secondary | ICD-10-CM | POA: Diagnosis not present

## 2024-05-15 DIAGNOSIS — M170 Bilateral primary osteoarthritis of knee: Secondary | ICD-10-CM | POA: Diagnosis not present

## 2024-05-16 DIAGNOSIS — M170 Bilateral primary osteoarthritis of knee: Secondary | ICD-10-CM | POA: Diagnosis not present

## 2024-05-17 DIAGNOSIS — M170 Bilateral primary osteoarthritis of knee: Secondary | ICD-10-CM | POA: Diagnosis not present

## 2024-05-18 DIAGNOSIS — M170 Bilateral primary osteoarthritis of knee: Secondary | ICD-10-CM | POA: Diagnosis not present

## 2024-05-19 DIAGNOSIS — H6993 Unspecified Eustachian tube disorder, bilateral: Secondary | ICD-10-CM | POA: Diagnosis not present

## 2024-05-19 DIAGNOSIS — Z6837 Body mass index (BMI) 37.0-37.9, adult: Secondary | ICD-10-CM | POA: Diagnosis not present

## 2024-05-19 DIAGNOSIS — I10 Essential (primary) hypertension: Secondary | ICD-10-CM | POA: Diagnosis not present

## 2024-05-19 DIAGNOSIS — Z1231 Encounter for screening mammogram for malignant neoplasm of breast: Secondary | ICD-10-CM | POA: Diagnosis not present

## 2024-05-19 DIAGNOSIS — M25532 Pain in left wrist: Secondary | ICD-10-CM | POA: Diagnosis not present

## 2024-05-19 DIAGNOSIS — M170 Bilateral primary osteoarthritis of knee: Secondary | ICD-10-CM | POA: Diagnosis not present

## 2024-05-20 DIAGNOSIS — M170 Bilateral primary osteoarthritis of knee: Secondary | ICD-10-CM | POA: Diagnosis not present

## 2024-05-21 DIAGNOSIS — M170 Bilateral primary osteoarthritis of knee: Secondary | ICD-10-CM | POA: Diagnosis not present

## 2024-05-22 DIAGNOSIS — M170 Bilateral primary osteoarthritis of knee: Secondary | ICD-10-CM | POA: Diagnosis not present

## 2024-05-23 DIAGNOSIS — M170 Bilateral primary osteoarthritis of knee: Secondary | ICD-10-CM | POA: Diagnosis not present

## 2024-05-24 DIAGNOSIS — M170 Bilateral primary osteoarthritis of knee: Secondary | ICD-10-CM | POA: Diagnosis not present

## 2024-05-25 DIAGNOSIS — M170 Bilateral primary osteoarthritis of knee: Secondary | ICD-10-CM | POA: Diagnosis not present

## 2024-05-26 DIAGNOSIS — M170 Bilateral primary osteoarthritis of knee: Secondary | ICD-10-CM | POA: Diagnosis not present

## 2024-05-27 DIAGNOSIS — M170 Bilateral primary osteoarthritis of knee: Secondary | ICD-10-CM | POA: Diagnosis not present

## 2024-05-28 DIAGNOSIS — M170 Bilateral primary osteoarthritis of knee: Secondary | ICD-10-CM | POA: Diagnosis not present

## 2024-05-29 DIAGNOSIS — M170 Bilateral primary osteoarthritis of knee: Secondary | ICD-10-CM | POA: Diagnosis not present

## 2024-05-30 DIAGNOSIS — M170 Bilateral primary osteoarthritis of knee: Secondary | ICD-10-CM | POA: Diagnosis not present

## 2024-05-31 DIAGNOSIS — M170 Bilateral primary osteoarthritis of knee: Secondary | ICD-10-CM | POA: Diagnosis not present

## 2024-06-01 DIAGNOSIS — M170 Bilateral primary osteoarthritis of knee: Secondary | ICD-10-CM | POA: Diagnosis not present

## 2024-06-02 DIAGNOSIS — M170 Bilateral primary osteoarthritis of knee: Secondary | ICD-10-CM | POA: Diagnosis not present

## 2024-06-03 DIAGNOSIS — M170 Bilateral primary osteoarthritis of knee: Secondary | ICD-10-CM | POA: Diagnosis not present

## 2024-06-04 DIAGNOSIS — M170 Bilateral primary osteoarthritis of knee: Secondary | ICD-10-CM | POA: Diagnosis not present

## 2024-06-09 DIAGNOSIS — Z1231 Encounter for screening mammogram for malignant neoplasm of breast: Secondary | ICD-10-CM | POA: Diagnosis not present
